# Patient Record
Sex: Male | Born: 1945 | Race: Black or African American | Hispanic: No | Marital: Single | Smoking: Former smoker
Health system: Southern US, Community
[De-identification: ages and names within clinical notes are randomized; demographics above are authoritative.]

## PROBLEM LIST (undated history)

## (undated) DIAGNOSIS — C61 Malignant neoplasm of prostate: Secondary | ICD-10-CM

## (undated) DIAGNOSIS — I10 Essential (primary) hypertension: Secondary | ICD-10-CM

## (undated) DIAGNOSIS — M069 Rheumatoid arthritis, unspecified: Secondary | ICD-10-CM

## (undated) DIAGNOSIS — K219 Gastro-esophageal reflux disease without esophagitis: Secondary | ICD-10-CM

## (undated) DIAGNOSIS — E7800 Pure hypercholesterolemia, unspecified: Secondary | ICD-10-CM

## (undated) HISTORY — PX: PROSTATE BIOPSY: SHX241

---

## 1999-09-05 ENCOUNTER — Encounter: Admission: RE | Admit: 1999-09-05 | Discharge: 1999-09-05 | Payer: Self-pay

## 2001-01-21 ENCOUNTER — Emergency Department (HOSPITAL_COMMUNITY): Admission: EM | Admit: 2001-01-21 | Discharge: 2001-01-21 | Payer: Self-pay | Admitting: Emergency Medicine

## 2001-01-21 ENCOUNTER — Encounter: Payer: Self-pay | Admitting: Emergency Medicine

## 2001-04-18 ENCOUNTER — Emergency Department (HOSPITAL_COMMUNITY): Admission: EM | Admit: 2001-04-18 | Discharge: 2001-04-18 | Payer: Self-pay | Admitting: Emergency Medicine

## 2001-04-18 ENCOUNTER — Encounter: Payer: Self-pay | Admitting: Emergency Medicine

## 2004-09-18 ENCOUNTER — Encounter (INDEPENDENT_AMBULATORY_CARE_PROVIDER_SITE_OTHER): Payer: Self-pay | Admitting: *Deleted

## 2004-09-18 ENCOUNTER — Ambulatory Visit (HOSPITAL_COMMUNITY): Admission: RE | Admit: 2004-09-18 | Discharge: 2004-09-18 | Payer: Self-pay | Admitting: Gastroenterology

## 2005-12-09 ENCOUNTER — Emergency Department (HOSPITAL_COMMUNITY): Admission: EM | Admit: 2005-12-09 | Discharge: 2005-12-09 | Payer: Self-pay | Admitting: Emergency Medicine

## 2006-08-07 ENCOUNTER — Encounter: Admission: RE | Admit: 2006-08-07 | Discharge: 2006-08-07 | Payer: Self-pay | Admitting: Family Medicine

## 2006-08-16 ENCOUNTER — Emergency Department (HOSPITAL_COMMUNITY): Admission: EM | Admit: 2006-08-16 | Discharge: 2006-08-16 | Payer: Self-pay | Admitting: Emergency Medicine

## 2006-09-08 HISTORY — PX: OTHER SURGICAL HISTORY: SHX169

## 2007-09-09 HISTORY — PX: OTHER SURGICAL HISTORY: SHX169

## 2008-03-01 ENCOUNTER — Emergency Department (HOSPITAL_COMMUNITY): Admission: EM | Admit: 2008-03-01 | Discharge: 2008-03-01 | Payer: Self-pay | Admitting: Emergency Medicine

## 2008-03-01 ENCOUNTER — Ambulatory Visit (HOSPITAL_COMMUNITY): Admission: AD | Admit: 2008-03-01 | Discharge: 2008-03-01 | Payer: Self-pay | Admitting: Urology

## 2009-11-27 ENCOUNTER — Encounter: Admission: RE | Admit: 2009-11-27 | Discharge: 2009-11-27 | Payer: Self-pay | Admitting: Otolaryngology

## 2009-12-20 ENCOUNTER — Encounter: Admission: RE | Admit: 2009-12-20 | Discharge: 2009-12-20 | Payer: Self-pay | Admitting: Otolaryngology

## 2009-12-20 ENCOUNTER — Other Ambulatory Visit: Admission: RE | Admit: 2009-12-20 | Discharge: 2009-12-20 | Payer: Self-pay | Admitting: Interventional Radiology

## 2010-04-02 ENCOUNTER — Encounter: Admission: RE | Admit: 2010-04-02 | Discharge: 2010-04-02 | Payer: Self-pay | Admitting: Otolaryngology

## 2011-01-21 NOTE — Op Note (Signed)
NAME:  Charles Frost, Charles Frost NO.:  [id number]   MEDICAL RECORD NO.:  [id number]          PATIENT TYPE:  AMB   LOCATION:  DAY                          FACILITY:  [facility]   PHYSICIAN:  Sigmund I. Patsi Sears, M.D.DATE OF BIRTH:  28-Nov-1945   DATE OF PROCEDURE:  03/01/2008  DATE OF DISCHARGE:                               OPERATIVE REPORT   PREOPERATIVE DIAGNOSES:  Distal right ureteral calculus with  hydronephrosis, and ureteral colic.   POSTOPERATIVE DIAGNOSES:  Distal right ureteral calculus with  hydronephrosis, and ureteral colic.   OPERATION:  Cystourethroscopy, right retrograde pyelogram with  interpretation, right ureteroscopy, basket extraction of right distal  ureteral calculus, right double-J catheter (6 x 24 cm).   SURGEON:  Dr. Patsi Sears.   ANESTHESIA:  General LMA.   PREPARATION:  After appropriate preanesthesia, the patient was brought  to the operating room and placed on the operating room in the dorsal  supine position where general LMA anesthesia was introduced.  He was  then replaced in the dorsal lithotomy position.  The pubis was prepped  with Betadine solution and draped in the usual fashion.   BRIEF HISTORY:  This 65 year old male seen in [facility] emergency  room, with 8.5 mm right distal ureteral calculus, with right sided  hydronephrosis, and renal colic.  He is now for basket extraction.   DESCRIPTION OF PROCEDURE:  Cystourethroscopy revealed a normal-appearing  bladder.  There was no evidence of bladder stone, tumor or diverticular  formation.  The prostate was subtotal occluded.   Right retrograde pyelogram was performed, which showed a distal right  ureteral calculus as noted on CT scan, which was evaluated in the  operating room.  A guidewire was passed into the right renal pelvis, and  the ureteroscope was passed into the ureter.  The right ureteral  calculus identified, and was oblong in shape, but rather smooth.  The  nitinol  basket was passed beyond the stone, dragged distalward, and the  stone was captured.  It was manipulated through the intramural ureter,  and the ureteral orifice, with some blood clot behind it.  This was  irrigated free from the bladder.  It was elected to leave a double-J  catheter, and therefore, a 6 x 24 double-J catheter was passed into the  renal pelvis, and coiled in the bladder.  The patient tolerated the  procedure well.  He was given a B&O suppository, and IV Toradol,  awakened and taken to the recovery room in good condition.      Sigmund I. Patsi Sears, M.D.  Electronically Signed     SIT/MEDQ  D:  03/01/2008  T:  03/01/2008  Job:  [id number]

## 2011-01-24 NOTE — Op Note (Signed)
NAME:  Charles Frost, Charles Frost NO.:  [id number]   MEDICAL RECORD NO.:  [id number]          PATIENT TYPE:  AMB   LOCATION:  ENDO                         FACILITY:  [facility]   PHYSICIAN:  Graylin Shiver, M.D.   DATE OF BIRTH:  11-10-1945   DATE OF PROCEDURE:  09/18/2004  DATE OF DISCHARGE:                                 OPERATIVE REPORT   INDICATIONS:  Screening.   Informed consent was obtained after explanation of the risks of bleeding,  infection and perforation.   PREMEDICATION:  Fentanyl 50 mcg IV, Versed 7 milligrams IV.   PROCEDURE:  With the patient in the left lateral decubitus position, a  rectal exam was performed. No masses were felt. The Olympus colonoscope was  inserted into the rectum and advanced around an extremely tortuous colon to  the cecum. Cecal landmarks were identified. The cecum and ascending colon  were normal. The transverse colon was normal. In the distal descending colon  there was a small 2 mm polyp biopsied off with cold forceps. In the sigmoid  there was a 5 mm sessile polyp snared with a mini snare and removed by snare  cautery technique. The polyp was retrieved. The cautery site looked good.  The rectum looked normal. He tolerated the procedure well without  complications.   IMPRESSION:  Colon polyps.   PLAN:  Pathology will be checked.       SFG/MEDQ  D:  09/18/2004  T:  09/18/2004  Job:  [id number]   cc:   Madison Hickman, M.D.

## 2011-06-05 LAB — URINALYSIS, ROUTINE W REFLEX MICROSCOPIC
Bilirubin Urine: NEGATIVE
Glucose, UA: NEGATIVE
Hgb urine dipstick: NEGATIVE
Nitrite: NEGATIVE
Specific Gravity, Urine: 1.02

## 2011-06-05 LAB — URINE CULTURE: Culture: NO GROWTH

## 2011-06-05 LAB — CBC
HCT: 40.6
MCHC: 32.4
MCV: 84.1
RDW: 14.2

## 2011-06-05 LAB — DIFFERENTIAL
Basophils Absolute: 0
Eosinophils Absolute: 0
Eosinophils Relative: 0
Lymphocytes Relative: 8 — ABNORMAL LOW
Lymphs Abs: 0.7
Monocytes Relative: 5
Neutrophils Relative %: 86 — ABNORMAL HIGH

## 2011-06-05 LAB — POCT I-STAT, CHEM 8
Calcium, Ion: 1.19
Glucose, Bld: 140 — ABNORMAL HIGH
HCT: 44

## 2013-01-21 ENCOUNTER — Emergency Department (HOSPITAL_COMMUNITY)
Admission: EM | Admit: 2013-01-21 | Discharge: 2013-01-22 | Disposition: A | Discharge status: Home / Self Care | Payer: 59 | Attending: Emergency Medicine | Admitting: Emergency Medicine

## 2013-01-21 DIAGNOSIS — N200 Calculus of kidney: Secondary | ICD-10-CM | POA: Insufficient documentation

## 2013-01-21 DIAGNOSIS — R300 Dysuria: Secondary | ICD-10-CM | POA: Insufficient documentation

## 2013-01-21 DIAGNOSIS — L0291 Cutaneous abscess, unspecified: Secondary | ICD-10-CM | POA: Insufficient documentation

## 2013-01-21 LAB — URINALYSIS, ROUTINE W REFLEX MICROSCOPIC
Bilirubin Urine: NEGATIVE
Glucose, UA: NEGATIVE mg/dL
Ketones, ur: NEGATIVE mg/dL
Leukocytes, UA: NEGATIVE
Nitrite: NEGATIVE
Protein, ur: NEGATIVE mg/dL

## 2013-01-21 LAB — COMPREHENSIVE METABOLIC PANEL
AST: 21 U/L (ref 0–37)
Albumin: 4 g/dL (ref 3.5–5.2)
Alkaline Phosphatase: 72 U/L (ref 39–117)
CO2: 26 mEq/L (ref 19–32)
Chloride: 104 mEq/L (ref 96–112)
Creatinine, Ser: 1.09 mg/dL (ref 0.50–1.35)
Total Protein: 7.8 g/dL (ref 6.0–8.3)

## 2013-01-21 LAB — URINE MICROSCOPIC-ADD ON

## 2013-01-21 LAB — CBC WITH DIFFERENTIAL/PLATELET
Eosinophils Absolute: 0.4 10*3/uL (ref 0.0–0.7)
HCT: 39.2 % (ref 39.0–52.0)
Hemoglobin: 12.9 g/dL — ABNORMAL LOW (ref 13.0–17.0)
Lymphocytes Relative: 29 % (ref 12–46)
MCH: 27.3 pg (ref 26.0–34.0)
MCHC: 32.9 g/dL (ref 30.0–36.0)
MCV: 83.1 fL (ref 78.0–100.0)
Neutro Abs: 3.8 10*3/uL (ref 1.7–7.7)
Platelets: 293 10*3/uL (ref 150–400)
RBC: 4.72 MIL/uL (ref 4.22–5.81)
WBC: 6.6 10*3/uL (ref 4.0–10.5)

## 2013-01-21 MED ORDER — ONDANSETRON 4 MG PO TBDP
4.0000 mg | ORAL_TABLET | Freq: Once | ORAL | Status: AC
  Administered 2013-01-21: 4 mg via ORAL
  Filled 2013-01-21: qty 1

## 2013-01-21 MED ORDER — OXYCODONE-ACETAMINOPHEN 5-325 MG PO TABS
1.0000 | ORAL_TABLET | Freq: Once | ORAL | Status: AC
  Administered 2013-01-21: 1 via ORAL
  Filled 2013-01-21: qty 1

## 2013-01-21 NOTE — ED Notes (Signed)
Pt states left flank pain for 3-4 days. Pt states hx kidney stones. Pt also having burning sensation with urination. Pt states no blood in his urine.

## 2013-01-22 ENCOUNTER — Emergency Department (HOSPITAL_COMMUNITY): Payer: 59

## 2013-01-22 MED ORDER — HYDROCODONE-ACETAMINOPHEN 5-325 MG PO TABS: 1.0000 | ORAL_TABLET | Freq: Four times a day (QID) | ORAL | Status: DC | PRN

## 2013-01-22 MED ORDER — IBUPROFEN 400 MG PO TABS
400.0000 mg | ORAL_TABLET | Freq: Once | ORAL | Status: AC
  Administered 2013-01-22: 400 mg via ORAL
  Filled 2013-01-22: qty 1

## 2013-01-22 MED ORDER — OXYCODONE-ACETAMINOPHEN 5-325 MG PO TABS
1.0000 | ORAL_TABLET | Freq: Once | ORAL | Status: AC
  Administered 2013-01-22: 1 via ORAL
  Filled 2013-01-22: qty 1

## 2013-01-22 MED ORDER — NAPROXEN 375 MG PO TABS: 375.0000 mg | ORAL_TABLET | Freq: Two times a day (BID) | ORAL | Status: DC

## 2013-01-22 NOTE — ED Provider Notes (Signed)
History     CSN: [id number]  Arrival date & time 01/21/13  2140   First MD Initiated Contact with Patient 01/21/13 2345      Chief Complaint  Patient presents with  . Flank Pain    left     (Consider location/radiation/quality/duration/timing/severity/associated sxs/prior treatment) HPI Comments: Charles Frost is a 67 y.o. male patient with a history of kidney stones presents to the emergency department complaining of flank pain radiating to the groin started approximately 3-4 days ago. Patient states that the pain is intermittent and sometimes the spikes depending severity. Patient is currently on Bactrim for chronic abscesses. Patient denies any hematuria or urinary frequency, but does report mild dysuria. No penile dc. No other at this time.  Patient is a 67 y.o. male presenting with flank pain. The history is provided by the patient.  Flank Pain    No past medical history on file.  No past surgical history on file.  No family history on file.  History  Substance Use Topics  . Smoking status: Not on file  . Smokeless tobacco: Not on file  . Alcohol Use: Not on file      Review of Systems  Genitourinary: Positive for flank pain.  All other systems reviewed and are negative.    Allergies  Review of patient's allergies indicates no known allergies.  Home Medications   Current Outpatient Rx  Name  Route  Sig  Dispense  Refill  . naproxen (NAPROSYN) 500 MG tablet   Oral   Take 500 mg by mouth 2 (two) times daily as needed (arthritis pain).         Bertram Gala Glycol-Propyl Glycol (SYSTANE OP)   Both Eyes   Place 1-2 drops into both eyes daily as needed (eye irritation).         Marland Kitchen sulfamethoxazole-trimethoprim (BACTRIM DS) 800-160 MG per tablet   Oral   Take 1 tablet by mouth 2 (two) times daily. 14 day treatment for absess in mouth           BP 178/99  Pulse 83  Temp(Src) 98 F (36.7 C) (Oral)  Resp 16  SpO2 97%  Physical Exam  Nursing  note and vitals reviewed. Constitutional: He is oriented to person, place, and time. He appears well-developed and well-nourished. No distress.  HENT:  Head: Normocephalic and atraumatic.  Eyes: Conjunctivae and EOM are normal.  Neck: Normal range of motion.  Pulmonary/Chest: Effort normal.  Abdominal:  Soft non tender abdomen   Genitourinary:  Very mild right CVA tenderness. No penile dc or other abnormality seen. Exam chaperoned.   Musculoskeletal: Normal range of motion.  Neurological: He is alert and oriented to person, place, and time.  Skin: Skin is warm and dry. No rash noted. He is not diaphoretic.  Psychiatric: He has a normal mood and affect. His behavior is normal.    ED Course  Procedures (including critical care time)  Labs Reviewed  CBC WITH DIFFERENTIAL - Abnormal; Notable for the following:    Hemoglobin 12.9 (*)    Eosinophils Relative 7 (*)    All other components within normal limits  COMPREHENSIVE METABOLIC PANEL - Abnormal; Notable for the following:    Glucose, Bld 143 (*)    GFR calc non Af Amer 70 (*)    GFR calc Af Amer 81 (*)    All other components within normal limits  URINALYSIS, ROUTINE W REFLEX MICROSCOPIC - Abnormal; Notable for the following:    Hgb  urine dipstick LARGE (*)    All other components within normal limits  URINE MICROSCOPIC-ADD ON   Ct Abdomen Pelvis Wo Contrast  01/22/2013   *RADIOLOGY REPORT*  Clinical Data: Flank pain  CT ABDOMEN AND PELVIS WITHOUT CONTRAST  Technique:  Multidetector CT imaging of the abdomen and pelvis was performed following the standard protocol without intravenous contrast.  Comparison: 03/01/2008  Findings: The lung bases both appear clear.  There is no pericardial or pleural effusion noted.  Small focus of low attenuation in the inferior right hepatic lobe is too small to characterize measuring 6 mm.  Gallbladder is normal.  No biliary dilatation.  Calcifications within the pancreatic parenchyma suggest  chronic pancreatitis.  The spleen is normal.  The adrenal glands are both unremarkable.  Multiple right renal calculi are identified.  The largest is in the inferior pole measuring 7 mm.  Multiple left renal calculi are also noted.  The largest is in the inferior pole measuring 6 mm.  There is no evidence for hydronephrosis.  No significant hydroureter identified.  Within the urinary bladder near the left UVJ there is a 5 mm stone, image 75/series 2.  The prostate gland and seminal vesicles are unremarkable.  There is mild calcified atherosclerotic disease affecting the abdominal aorta.  No aneurysm is identified.  No upper abdominal adenopathy noted.  There is no pelvic or inguinal adenopathy.  No free fluid or abnormal fluid collection is identified within the abdomen or the pelvis.  Review of the visualized osseous structures shows mild degenerative disc disease at the L3-4 level.  IMPRESSION:  1.  Bladder calculus near the left UVJ measures 5 mm.  No significant obstructive uropathy identified. 2.  Bilateral renal calculi. 3.  Calcifications of the pancreatic parenchyma.  Correlate for any clinical signs or symptoms of chronic pancreatitis.   Original Report Authenticated By: Signa Kell, M.D.     No diagnosis found.    MDM  Pt has been diagnosed with a recently passed Kidney Stone via CT. There is no evidence of significant hydronephrosis, serum creatine WNL, vitals sign stable and the pt does not have irratractable vomiting. Pt will be dc home with pain medications & has been advised to follow up with PCP.          Jaci Carrel, [place] 01/22/13 0126

## 2013-01-22 NOTE — ED Provider Notes (Signed)
Medical screening examination/treatment/procedure(s) were performed by non-physician practitioner and as supervising physician I was immediately available for consultation/collaboration.  Dametri Ozburn M Shalena Ezzell, MD 01/22/13 0626 

## 2014-06-14 ENCOUNTER — Ambulatory Visit
Admission: RE | Admit: 2014-06-14 | Discharge: 2014-06-14 | Disposition: A | Discharge status: Home / Self Care | Payer: Commercial Managed Care - HMO | Source: Ambulatory Visit | Attending: Physician Assistant | Admitting: Physician Assistant

## 2014-06-14 ENCOUNTER — Other Ambulatory Visit: Payer: Self-pay | Admitting: Physician Assistant

## 2014-06-14 DIAGNOSIS — M79641 Pain in right hand: Secondary | ICD-10-CM

## 2014-06-14 DIAGNOSIS — M79642 Pain in left hand: Principal | ICD-10-CM

## 2014-09-08 DIAGNOSIS — M069 Rheumatoid arthritis, unspecified: Secondary | ICD-10-CM | POA: Insufficient documentation

## 2014-10-02 DIAGNOSIS — E790 Hyperuricemia without signs of inflammatory arthritis and tophaceous disease: Secondary | ICD-10-CM | POA: Diagnosis not present

## 2014-10-02 DIAGNOSIS — M25442 Effusion, left hand: Secondary | ICD-10-CM | POA: Diagnosis not present

## 2014-10-02 DIAGNOSIS — M0589 Other rheumatoid arthritis with rheumatoid factor of multiple sites: Secondary | ICD-10-CM | POA: Diagnosis not present

## 2014-10-02 DIAGNOSIS — Z840 Family history of diseases of the skin and subcutaneous tissue: Secondary | ICD-10-CM | POA: Diagnosis not present

## 2014-11-30 DIAGNOSIS — Z79899 Other long term (current) drug therapy: Secondary | ICD-10-CM | POA: Diagnosis not present

## 2014-11-30 DIAGNOSIS — M2550 Pain in unspecified joint: Secondary | ICD-10-CM | POA: Diagnosis not present

## 2014-11-30 DIAGNOSIS — M0589 Other rheumatoid arthritis with rheumatoid factor of multiple sites: Secondary | ICD-10-CM | POA: Diagnosis not present

## 2014-12-28 DIAGNOSIS — M0589 Other rheumatoid arthritis with rheumatoid factor of multiple sites: Secondary | ICD-10-CM | POA: Diagnosis not present

## 2014-12-28 DIAGNOSIS — Z9229 Personal history of other drug therapy: Secondary | ICD-10-CM | POA: Diagnosis not present

## 2014-12-28 DIAGNOSIS — M0559 Rheumatoid polyneuropathy with rheumatoid arthritis of multiple sites: Secondary | ICD-10-CM | POA: Diagnosis not present

## 2015-02-01 DIAGNOSIS — M0589 Other rheumatoid arthritis with rheumatoid factor of multiple sites: Secondary | ICD-10-CM | POA: Diagnosis not present

## 2015-02-01 DIAGNOSIS — M2550 Pain in unspecified joint: Secondary | ICD-10-CM | POA: Diagnosis not present

## 2015-02-01 DIAGNOSIS — E790 Hyperuricemia without signs of inflammatory arthritis and tophaceous disease: Secondary | ICD-10-CM | POA: Diagnosis not present

## 2015-02-01 DIAGNOSIS — Z79899 Other long term (current) drug therapy: Secondary | ICD-10-CM | POA: Diagnosis not present

## 2015-02-15 DIAGNOSIS — M0589 Other rheumatoid arthritis with rheumatoid factor of multiple sites: Secondary | ICD-10-CM | POA: Diagnosis not present

## 2015-03-07 DIAGNOSIS — J019 Acute sinusitis, unspecified: Secondary | ICD-10-CM | POA: Diagnosis not present

## 2015-03-07 DIAGNOSIS — Z1389 Encounter for screening for other disorder: Secondary | ICD-10-CM | POA: Diagnosis not present

## 2015-03-07 DIAGNOSIS — Z9181 History of falling: Secondary | ICD-10-CM | POA: Diagnosis not present

## 2015-03-07 DIAGNOSIS — Z6829 Body mass index (BMI) 29.0-29.9, adult: Secondary | ICD-10-CM | POA: Diagnosis not present

## 2015-03-07 DIAGNOSIS — Z139 Encounter for screening, unspecified: Secondary | ICD-10-CM | POA: Diagnosis not present

## 2015-07-04 DIAGNOSIS — Z79899 Other long term (current) drug therapy: Secondary | ICD-10-CM | POA: Diagnosis not present

## 2015-07-04 DIAGNOSIS — M0589 Other rheumatoid arthritis with rheumatoid factor of multiple sites: Secondary | ICD-10-CM | POA: Diagnosis not present

## 2015-07-26 DIAGNOSIS — M2550 Pain in unspecified joint: Secondary | ICD-10-CM | POA: Diagnosis not present

## 2015-07-26 DIAGNOSIS — Z79899 Other long term (current) drug therapy: Secondary | ICD-10-CM | POA: Diagnosis not present

## 2015-07-26 DIAGNOSIS — E790 Hyperuricemia without signs of inflammatory arthritis and tophaceous disease: Secondary | ICD-10-CM | POA: Diagnosis not present

## 2015-07-26 DIAGNOSIS — M0589 Other rheumatoid arthritis with rheumatoid factor of multiple sites: Secondary | ICD-10-CM | POA: Diagnosis not present

## 2015-09-25 DIAGNOSIS — J019 Acute sinusitis, unspecified: Secondary | ICD-10-CM | POA: Diagnosis not present

## 2015-09-25 DIAGNOSIS — Z681 Body mass index (BMI) 19 or less, adult: Secondary | ICD-10-CM | POA: Diagnosis not present

## 2015-10-10 DIAGNOSIS — E790 Hyperuricemia without signs of inflammatory arthritis and tophaceous disease: Secondary | ICD-10-CM | POA: Diagnosis not present

## 2015-10-10 DIAGNOSIS — M2550 Pain in unspecified joint: Secondary | ICD-10-CM | POA: Diagnosis not present

## 2015-10-10 DIAGNOSIS — M0589 Other rheumatoid arthritis with rheumatoid factor of multiple sites: Secondary | ICD-10-CM | POA: Diagnosis not present

## 2015-10-10 DIAGNOSIS — Z79899 Other long term (current) drug therapy: Secondary | ICD-10-CM | POA: Diagnosis not present

## 2015-11-09 DIAGNOSIS — Z0000 Encounter for general adult medical examination without abnormal findings: Secondary | ICD-10-CM | POA: Diagnosis not present

## 2015-11-09 DIAGNOSIS — I10 Essential (primary) hypertension: Secondary | ICD-10-CM | POA: Diagnosis not present

## 2015-11-09 DIAGNOSIS — Z136 Encounter for screening for cardiovascular disorders: Secondary | ICD-10-CM | POA: Diagnosis not present

## 2015-11-09 DIAGNOSIS — Z125 Encounter for screening for malignant neoplasm of prostate: Secondary | ICD-10-CM | POA: Diagnosis not present

## 2015-11-09 DIAGNOSIS — Z6829 Body mass index (BMI) 29.0-29.9, adult: Secondary | ICD-10-CM | POA: Diagnosis not present

## 2015-11-09 DIAGNOSIS — Z7289 Other problems related to lifestyle: Secondary | ICD-10-CM | POA: Diagnosis not present

## 2015-11-09 DIAGNOSIS — Z131 Encounter for screening for diabetes mellitus: Secondary | ICD-10-CM | POA: Diagnosis not present

## 2016-01-02 DIAGNOSIS — M0589 Other rheumatoid arthritis with rheumatoid factor of multiple sites: Secondary | ICD-10-CM | POA: Diagnosis not present

## 2016-01-02 DIAGNOSIS — Z79899 Other long term (current) drug therapy: Secondary | ICD-10-CM | POA: Diagnosis not present

## 2016-01-02 DIAGNOSIS — M2550 Pain in unspecified joint: Secondary | ICD-10-CM | POA: Diagnosis not present

## 2016-01-02 DIAGNOSIS — E790 Hyperuricemia without signs of inflammatory arthritis and tophaceous disease: Secondary | ICD-10-CM | POA: Diagnosis not present

## 2016-02-12 DIAGNOSIS — R972 Elevated prostate specific antigen [PSA]: Secondary | ICD-10-CM | POA: Diagnosis not present

## 2016-02-28 DIAGNOSIS — K5730 Diverticulosis of large intestine without perforation or abscess without bleeding: Secondary | ICD-10-CM | POA: Diagnosis not present

## 2016-02-28 DIAGNOSIS — Z86010 Personal history of colonic polyps: Secondary | ICD-10-CM | POA: Diagnosis not present

## 2016-03-05 DIAGNOSIS — M2550 Pain in unspecified joint: Secondary | ICD-10-CM | POA: Diagnosis not present

## 2016-03-05 DIAGNOSIS — M0589 Other rheumatoid arthritis with rheumatoid factor of multiple sites: Secondary | ICD-10-CM | POA: Diagnosis not present

## 2016-03-05 DIAGNOSIS — E790 Hyperuricemia without signs of inflammatory arthritis and tophaceous disease: Secondary | ICD-10-CM | POA: Diagnosis not present

## 2016-03-05 DIAGNOSIS — Z79899 Other long term (current) drug therapy: Secondary | ICD-10-CM | POA: Diagnosis not present

## 2016-03-05 DIAGNOSIS — R5383 Other fatigue: Secondary | ICD-10-CM | POA: Diagnosis not present

## 2016-04-29 DIAGNOSIS — Z79899 Other long term (current) drug therapy: Secondary | ICD-10-CM | POA: Diagnosis not present

## 2016-04-29 DIAGNOSIS — M2550 Pain in unspecified joint: Secondary | ICD-10-CM | POA: Diagnosis not present

## 2016-04-29 DIAGNOSIS — E790 Hyperuricemia without signs of inflammatory arthritis and tophaceous disease: Secondary | ICD-10-CM | POA: Diagnosis not present

## 2016-04-29 DIAGNOSIS — Z23 Encounter for immunization: Secondary | ICD-10-CM | POA: Diagnosis not present

## 2016-04-29 DIAGNOSIS — M0589 Other rheumatoid arthritis with rheumatoid factor of multiple sites: Secondary | ICD-10-CM | POA: Diagnosis not present

## 2016-05-13 DIAGNOSIS — Z23 Encounter for immunization: Secondary | ICD-10-CM | POA: Diagnosis not present

## 2016-05-23 DIAGNOSIS — R351 Nocturia: Secondary | ICD-10-CM | POA: Diagnosis not present

## 2016-05-23 DIAGNOSIS — M069 Rheumatoid arthritis, unspecified: Secondary | ICD-10-CM | POA: Diagnosis not present

## 2016-05-23 DIAGNOSIS — R972 Elevated prostate specific antigen [PSA]: Secondary | ICD-10-CM | POA: Diagnosis not present

## 2016-05-23 DIAGNOSIS — N401 Enlarged prostate with lower urinary tract symptoms: Secondary | ICD-10-CM | POA: Diagnosis not present

## 2016-05-27 DIAGNOSIS — M0589 Other rheumatoid arthritis with rheumatoid factor of multiple sites: Secondary | ICD-10-CM | POA: Diagnosis not present

## 2016-05-27 DIAGNOSIS — Z79899 Other long term (current) drug therapy: Secondary | ICD-10-CM | POA: Diagnosis not present

## 2016-05-27 DIAGNOSIS — M2550 Pain in unspecified joint: Secondary | ICD-10-CM | POA: Diagnosis not present

## 2016-05-27 DIAGNOSIS — E790 Hyperuricemia without signs of inflammatory arthritis and tophaceous disease: Secondary | ICD-10-CM | POA: Diagnosis not present

## 2016-06-20 DIAGNOSIS — J019 Acute sinusitis, unspecified: Secondary | ICD-10-CM | POA: Diagnosis not present

## 2016-06-20 DIAGNOSIS — Z6828 Body mass index (BMI) 28.0-28.9, adult: Secondary | ICD-10-CM | POA: Diagnosis not present

## 2016-07-18 DIAGNOSIS — R972 Elevated prostate specific antigen [PSA]: Secondary | ICD-10-CM | POA: Diagnosis not present

## 2016-07-28 DIAGNOSIS — E790 Hyperuricemia without signs of inflammatory arthritis and tophaceous disease: Secondary | ICD-10-CM | POA: Diagnosis not present

## 2016-07-28 DIAGNOSIS — Z79899 Other long term (current) drug therapy: Secondary | ICD-10-CM | POA: Diagnosis not present

## 2016-07-28 DIAGNOSIS — M0589 Other rheumatoid arthritis with rheumatoid factor of multiple sites: Secondary | ICD-10-CM | POA: Diagnosis not present

## 2016-07-28 DIAGNOSIS — M2550 Pain in unspecified joint: Secondary | ICD-10-CM | POA: Diagnosis not present

## 2016-08-08 DIAGNOSIS — C61 Malignant neoplasm of prostate: Secondary | ICD-10-CM | POA: Diagnosis not present

## 2016-08-19 DIAGNOSIS — C61 Malignant neoplasm of prostate: Secondary | ICD-10-CM | POA: Diagnosis not present

## 2016-08-20 ENCOUNTER — Encounter: Payer: Self-pay | Admitting: Radiation Oncology

## 2016-08-28 ENCOUNTER — Encounter: Payer: Self-pay | Admitting: Radiation Oncology

## 2016-08-28 ENCOUNTER — Ambulatory Visit
Admission: RE | Admit: 2016-08-28 | Discharge: 2016-08-28 | Disposition: A | Discharge status: Home / Self Care | Payer: Medicare HMO | Source: Ambulatory Visit | Attending: Radiation Oncology | Admitting: Radiation Oncology

## 2016-08-28 ENCOUNTER — Encounter: Payer: Self-pay | Admitting: Medical Oncology

## 2016-08-28 VITALS — BP 135/84 | HR 82 | Resp 16 | Ht 67.0 in | Wt 178.8 lb

## 2016-08-28 DIAGNOSIS — Z87891 Personal history of nicotine dependence: Secondary | ICD-10-CM | POA: Insufficient documentation

## 2016-08-28 DIAGNOSIS — C61 Malignant neoplasm of prostate: Secondary | ICD-10-CM | POA: Diagnosis not present

## 2016-08-28 DIAGNOSIS — M069 Rheumatoid arthritis, unspecified: Secondary | ICD-10-CM | POA: Insufficient documentation

## 2016-08-28 DIAGNOSIS — I10 Essential (primary) hypertension: Secondary | ICD-10-CM | POA: Insufficient documentation

## 2016-08-28 DIAGNOSIS — Z510 Encounter for antineoplastic radiation therapy: Secondary | ICD-10-CM | POA: Insufficient documentation

## 2016-08-28 DIAGNOSIS — K219 Gastro-esophageal reflux disease without esophagitis: Secondary | ICD-10-CM | POA: Insufficient documentation

## 2016-08-28 DIAGNOSIS — R972 Elevated prostate specific antigen [PSA]: Secondary | ICD-10-CM | POA: Diagnosis not present

## 2016-08-28 DIAGNOSIS — E7800 Pure hypercholesterolemia, unspecified: Secondary | ICD-10-CM | POA: Insufficient documentation

## 2016-08-28 HISTORY — DX: Gastro-esophageal reflux disease without esophagitis: K21.9

## 2016-08-28 HISTORY — DX: Essential (primary) hypertension: I10

## 2016-08-28 HISTORY — DX: Pure hypercholesterolemia, unspecified: E78.00

## 2016-08-28 HISTORY — DX: Rheumatoid arthritis, unspecified: M06.9

## 2016-08-28 HISTORY — DX: Malignant neoplasm of prostate: C61

## 2016-08-28 NOTE — Progress Notes (Signed)
GU Location of Tumor / Histology: Prostate  If Prostate Cancer, Gleason Score is (4 +4=8) and PSA is 7.6 (02/12/2016), 5.2 (11/09/2015), 3.0 (10/18/2012)  Charles Frost presented: has had progression of his voiding symptoms over the last 6 months including frequency and urgency.  He does not have a history of prostatitis.   Biopsies of prostate (if applicable) revealed:     Past/Anticipated interventions by urology, if any: Dr. Gaynelle Arabian- TRUSP: 07/18/2016  Past/Anticipated interventions by medical oncology, if any: n/a  Weight changes, if any: reports prior to diagnosis he weighed 190 lb. Present weight 178 lb. Relates weight loss to depression.  Bowel/Bladder complaints, if any: IPSS 24 with incomplete emptying, frequency, intermittency, urgency, weak stream, straining and nocturia x 4. Reports mild - moderate ED. Reports occasional dysuria. Reports scant hematuria since biopsy. Denies leakage or incontinence but, has come close due to urgency.   Nausea/Vomiting, if any: no  Pain issues, if any:  yes, related to effects of RA  SAFETY ISSUES:  Prior radiation? no  Pacemaker/ICD? no  Possible current pregnancy? N/A  Is the patient on methotrexate? YES  Current Complaints / other details:  70 year old male. Married.

## 2016-08-28 NOTE — Progress Notes (Signed)
Radiation Oncology         [phone] ________________________________  Initial outpatient Consultation  Name: Charles Frost MRN: [id number]  Date: 08/28/2016  DOB: 22-Dec-1945  CC:No PCP Per Patient  Carolan Clines, MD   REFERRING PHYSICIAN: Carolan Clines, MD  DIAGNOSIS: 70 y.o. gentleman with stage T1c adenocarcinoma of the prostate with a Gleason's score of 4+4 and a PSA of 7.6    ICD-9-CM ICD-10-CM   1. Malignant neoplasm of prostate ([place]) [place] Ambulatory referral to [place] is a 70 y.o. gentleman with a new diagnosis of prostate cancer.  He was noted to have an elevated PSA of 7.6 in April of 2017 by his primary care physician, Dr. Lisbeth Ply.  Accordingly, he was referred for evaluation in urology by Dr. Gaynelle Arabian, and on 08/19/16,  digital rectal examination was performed at that time revealing no nodularity.  The patient proceeded to transrectal ultrasound with 12 biopsies of the prostate on 08/19/16. The prostate volume measured 36.14 cc.  Out of 12 core biopsies, 5 were positive.  The maximum Gleason score was 4+4=8, and this was seen in right mid lateral portion of the gland. The patient reviewed the biopsy results with his urologist and he has kindly been referred today for discussion of potential radiation treatment options.      On review of systems, the patient notes a 12 lb weight loss since diagnosis; he states this is related to depression about his illness. He desc incomplete emptying, frequency, intermittency, urgency, weak stream ,straining, occasional dysuria, and nocturia x 4. He reports mild-moderate ED. He reports scant hematuria since biopsy.  He denies nausea/vomiting, leakage or incontinence. He notes pain due to effects of Ra. Of note, patient is on methotrexate.  PREVIOUS RADIATION THERAPY: No  PAST MEDICAL HISTORY:  Past Medical History:  Diagnosis Date  . GERD (gastroesophageal reflux  disease)   . Hypercholesterolemia   . Hypertension   . Prostate cancer ([place])   . Rheumatoid arthritis ([place])    RA      PAST SURGICAL HISTORY: Past Surgical History:  Procedure Laterality Date  . cysto uretero remove stone  2009  . cystoscopy insert stent  2009  . PROSTATE BIOPSY    . ulnar nerve release Right 2008    FAMILY HISTORY:  Family History  Problem Relation Age of Onset  . Cancer Maternal Aunt   . Cancer Paternal Grandfather     SOCIAL HISTORY:  reports that he quit smoking about 16 years ago. His smoking use included Cigarettes. He has a 7.50 pack-year smoking history. He has never used smokeless tobacco. He reports that he does not drink alcohol or use drugs. The patient is in a relationship and resides in [place], [place].  ALLERGIES: Patient has no known allergies.  MEDICATIONS:  Current Outpatient Prescriptions  Medication Sig Dispense Refill  . amoxicillin (AMOXIL) 875 MG tablet Take 875 mg by mouth 2 (two) times daily.    . folic acid (FOLVITE) 1 MG tablet Take 1 mg by mouth daily.    . lansoprazole (PREVACID) 30 MG capsule Take 30 mg by mouth daily at 12 noon.    . methotrexate (RHEUMATREX) 5 MG tablet Take 5 mg by mouth once a week. Caution: Chemotherapy. Protect from light.    Marland Kitchen HYDROcodone-acetaminophen (NORCO/VICODIN) 5-325 MG per tablet Take 1 tablet by mouth every 6 (six) hours as needed for pain. (Patient not taking: Reported on 08/28/2016) 15 tablet 0  .  naproxen (NAPROSYN) 500 MG tablet Take 500 mg by mouth 2 (two) times daily as needed (arthritis pain).    Vladimir Faster Glycol-Propyl Glycol (SYSTANE OP) Place 1-2 drops into both eyes daily as needed (eye irritation).    . predniSONE (DELTASONE) 5 MG tablet Take 5 mg by mouth daily with breakfast. PRN     No current facility-administered medications for this encounter.     REVIEW OF SYSTEMS:  On review of systems, the patient reports that he is doing well overall. He denies any chest pain, shortness of  breath, cough, fevers, chills, night sweats, unintended weight changes. He denies any bowel disturbances, and denies abdominal pain, nausea or vomiting. He denies any new musculoskeletal or joint aches or pains.  His IPSS score was 24 indicating severe urinary outflow obstructive symptoms.  He indicated that his erectile function is 14 to complete sexual activity about half of the time. A complete review of systems is obtained and is otherwise negative.    PHYSICAL EXAM:    height is 5\' 7"  (1.702 m) and weight is 178 lb 12.8 oz (81.1 kg). His blood pressure is 135/84 and his pulse is 82. His respiration is 16 and oxygen saturation is 100%.   In general this is a well appearing african [place] man in no acute distress. He's alert and oriented x4 and appropriate throughout the examination. Cardiopulmonary assessment is negative for acute distress and he exhibits normal effort.   KPS = 80  100 - Normal; no complaints; no evidence of disease. 90   - Able to carry on normal activity; minor signs or symptoms of disease. 80   - Normal activity with effort; some signs or symptoms of disease. 34   - Cares for self; unable to carry on normal activity or to do active work. 60   - Requires occasional assistance, but is able to care for most of his personal needs. 50   - Requires considerable assistance and frequent medical care. 75   - Disabled; requires special care and assistance. 37   - Severely disabled; hospital admission is indicated although death not imminent. 4   - Very sick; hospital admission necessary; active supportive treatment necessary. 10   - Moribund; fatal processes progressing rapidly. 0     - Dead  Karnofsky DA, [place], Craver LS and [facility] [phone]) The use of the nitrogen mustards in the palliative treatment of carcinoma: with particular reference to bronchogenic carcinoma Cancer 1 [phone]   LABORATORY DATA:  Lab Results  Component Value Date   WBC 6.6 01/21/2013   HGB  12.9 (L) 01/21/2013   HCT 39.2 01/21/2013   MCV 83.1 01/21/2013   PLT 293 01/21/2013   Lab Results  Component Value Date   NA 138 01/21/2013   K 4.5 01/21/2013   CL 104 01/21/2013   CO2 26 01/21/2013   Lab Results  Component Value Date   ALT 19 01/21/2013   AST 21 01/21/2013   ALKPHOS 72 01/21/2013   BILITOT 0.3 01/21/2013     RADIOGRAPHY: No results found.    IMPRESSION/PLAN:  58. 70 yo gentleman with Stage T1c adenocarcinoma of the prostate with a Gleason's score of 4+4 and a PSA of 7.6. Dr. Tammi Klippel reviews his pathology and work up thus far. He discussed the His T-Stage, Gleason's Score, and PSA put him into the high risk group.  natural history of prostate cancer.  We reviewed the the implications of T-stage, Gleason's Score, and PSA on decision-making  and outcomes in prostate cancer.  We discussed radiation treatment in the management of prostate cancer with regard to the logistics and delivery of external beam radiation treatment. Accordingly he is eligible for external beam radiation with androgen deprivation therapy for 2 years, with or without radioactive seeds implant as a boost if his symptoms of BPH improved. The patient is thinking about prostate IMRT over 8 weeks. He will meet with Dr. Alinda Money to help determine his best course of treatment. If he decides to proceed with IMRT, we will share this with Dr. Gaynelle Arabian and move forward with scheduling placement of three gold fiducial markers into the prostate to proceed with IMRT in the near future. We enjoyed meeting with him today, and will look forward to participating in the care of this very nice gentleman.  The above documentation reflects my direct findings during this shared patient visit. Please see the separate note by Dr. Tammi Klippel on this date for the remainder of the patient's plan of care.  In a visit lasting 60 minutes, greater than 50% of the time was spent face to face discussing his options for treatment detailing  each side effect profile as well as risks and benefits of each, and coordinating the patient's care.     Carola Rhine, PAC    This document serves as a record of services personally performed by Shona Simpson, PAC and [name], MD. It was created on their behalf by Bethann Humble, a trained medical scribe. The creation of this record is based on the scribe's personal observations and the provider's statements to them. This document has been checked and approved by the attending provider.

## 2016-08-28 NOTE — Progress Notes (Signed)
See progress note under physician encounter. 

## 2016-09-04 ENCOUNTER — Encounter: Payer: Self-pay | Admitting: *Deleted

## 2016-09-04 NOTE — Progress Notes (Signed)
[place] Psychosocial Distress Screening Clinical Social Work  Clinical Social Work was referred by distress screening protocol.  The patient scored a 7 on the Psychosocial Distress Thermometer which indicates moderate distress. Clinical Social Worker contacted patient by phone to assess for distress and other psychosocial needs. Mr. Tokarz shared he is "adjusting"- explained he is still adjusting to diagnosis and plans for treatment but feel that his emotions are managed.  CSW encouraged patient to contact CSW if he continues to have difficulty adjusting or if he would like additional support.  Patient reported no other concerns at this time.  ONCBCN DISTRESS SCREENING 08/28/2016  Screening Type Initial Screening  Distress experienced in past week (1-10) 7  Emotional problem type Depression;Nervousness/Anxiety;Adjusting to illness  Spiritual/Religous concerns type Loss of sense of purpose  Information Concerns Type Lack of info about maintaining fitness  Physical Problem type Loss of appetitie;Constipation/diarrhea;Changes in urination;Sexual problems  Physician notified of physical symptoms Yes  Referral to clinical psychology No  Referral to clinical social work Yes  Referral to dietition No  Referral to financial advocate No  Referral to support programs No  Referral to palliative care No    Polo Riley, MSW, LCSW, OSW-C Clinical Social Worker [place] [phone]

## 2016-09-23 DIAGNOSIS — C61 Malignant neoplasm of prostate: Secondary | ICD-10-CM | POA: Diagnosis not present

## 2016-10-03 ENCOUNTER — Other Ambulatory Visit (HOSPITAL_COMMUNITY): Payer: Self-pay | Admitting: Urology

## 2016-10-03 DIAGNOSIS — C61 Malignant neoplasm of prostate: Secondary | ICD-10-CM

## 2016-10-07 DIAGNOSIS — Z5111 Encounter for antineoplastic chemotherapy: Secondary | ICD-10-CM | POA: Diagnosis not present

## 2016-10-07 DIAGNOSIS — C61 Malignant neoplasm of prostate: Secondary | ICD-10-CM | POA: Diagnosis not present

## 2016-10-10 ENCOUNTER — Encounter (HOSPITAL_COMMUNITY)
Admission: RE | Admit: 2016-10-10 | Discharge: 2016-10-10 | Disposition: A | Discharge status: Home / Self Care | Payer: Medicare HMO | Source: Ambulatory Visit | Attending: Urology | Admitting: Urology

## 2016-10-10 DIAGNOSIS — C61 Malignant neoplasm of prostate: Secondary | ICD-10-CM | POA: Insufficient documentation

## 2016-10-10 MED ORDER — TECHNETIUM TC 99M MEDRONATE IV KIT
22.0000 | PACK | Freq: Once | INTRAVENOUS | Status: AC | PRN
  Administered 2016-10-10: 22 via INTRAVENOUS

## 2016-10-17 DIAGNOSIS — C61 Malignant neoplasm of prostate: Secondary | ICD-10-CM | POA: Diagnosis not present

## 2016-10-20 DIAGNOSIS — H25813 Combined forms of age-related cataract, bilateral: Secondary | ICD-10-CM | POA: Diagnosis not present

## 2016-10-23 ENCOUNTER — Ambulatory Visit
Admission: RE | Admit: 2016-10-23 | Discharge: 2016-10-23 | Disposition: A | Discharge status: Home / Self Care | Payer: Medicare HMO | Source: Ambulatory Visit | Attending: Radiation Oncology | Admitting: Radiation Oncology

## 2016-10-23 DIAGNOSIS — I10 Essential (primary) hypertension: Secondary | ICD-10-CM | POA: Diagnosis not present

## 2016-10-23 DIAGNOSIS — C61 Malignant neoplasm of prostate: Secondary | ICD-10-CM | POA: Diagnosis not present

## 2016-10-23 DIAGNOSIS — E7800 Pure hypercholesterolemia, unspecified: Secondary | ICD-10-CM | POA: Diagnosis not present

## 2016-10-23 DIAGNOSIS — K219 Gastro-esophageal reflux disease without esophagitis: Secondary | ICD-10-CM | POA: Diagnosis not present

## 2016-10-23 DIAGNOSIS — Z510 Encounter for antineoplastic radiation therapy: Secondary | ICD-10-CM | POA: Diagnosis not present

## 2016-10-23 DIAGNOSIS — Z87891 Personal history of nicotine dependence: Secondary | ICD-10-CM | POA: Diagnosis not present

## 2016-10-23 DIAGNOSIS — M069 Rheumatoid arthritis, unspecified: Secondary | ICD-10-CM | POA: Diagnosis not present

## 2016-10-23 NOTE — Progress Notes (Signed)
  Radiation Oncology         [phone]) [phone] ________________________________  Name: Charles Frost MRN: [id number]  Date: 10/23/2016  DOB: October 18, 1945  SIMULATION AND TREATMENT PLANNING NOTE    ICD-9-CM ICD-10-CM   1. Malignant neoplasm of prostate ([place]) 185 C61     DIAGNOSIS:  71 y.o. gentleman with stage T1c adenocarcinoma of the prostate with a Gleason's score of 4+4 and a PSA of 7.6  NARRATIVE:  The patient was brought to the [facility].  Identity was confirmed.  All relevant records and images related to the planned course of therapy were reviewed.  The patient freely provided informed written consent to proceed with treatment after reviewing the details related to the planned course of therapy. The consent form was witnessed and verified by the simulation staff.  Then, the patient was set-up in a stable reproducible supine position for radiation therapy.  A vacuum lock pillow device was custom fabricated to position his legs in a reproducible immobilized position.  Then, I performed a urethrogram under sterile conditions to identify the prostatic apex.  CT images were obtained.  Surface markings were placed.  The CT images were loaded into the planning software.  Then the prostate target and avoidance structures including the rectum, bladder, bowel and hips were contoured.  Treatment planning then occurred.  The radiation prescription was entered and confirmed.  A total of 1 complex treatment devices were fabricated. I have requested : Intensity Modulated Radiotherapy (IMRT) is medically necessary for this case for the following reason:  Rectal sparing.Marland Kitchen  PLAN:  The patient will receive 75 Gy in 40 fractions with 45 Gy to pelvic nodes then boost  ________________________________  Sheral Apley. Tammi Klippel, M.D.

## 2016-10-28 DIAGNOSIS — E790 Hyperuricemia without signs of inflammatory arthritis and tophaceous disease: Secondary | ICD-10-CM | POA: Diagnosis not present

## 2016-10-28 DIAGNOSIS — M2550 Pain in unspecified joint: Secondary | ICD-10-CM | POA: Diagnosis not present

## 2016-10-28 DIAGNOSIS — M0589 Other rheumatoid arthritis with rheumatoid factor of multiple sites: Secondary | ICD-10-CM | POA: Diagnosis not present

## 2016-10-28 DIAGNOSIS — Z79899 Other long term (current) drug therapy: Secondary | ICD-10-CM | POA: Diagnosis not present

## 2016-10-31 DIAGNOSIS — M069 Rheumatoid arthritis, unspecified: Secondary | ICD-10-CM | POA: Diagnosis not present

## 2016-10-31 DIAGNOSIS — E7800 Pure hypercholesterolemia, unspecified: Secondary | ICD-10-CM | POA: Diagnosis not present

## 2016-10-31 DIAGNOSIS — C61 Malignant neoplasm of prostate: Secondary | ICD-10-CM | POA: Diagnosis not present

## 2016-10-31 DIAGNOSIS — Z510 Encounter for antineoplastic radiation therapy: Secondary | ICD-10-CM | POA: Diagnosis not present

## 2016-10-31 DIAGNOSIS — Z87891 Personal history of nicotine dependence: Secondary | ICD-10-CM | POA: Diagnosis not present

## 2016-10-31 DIAGNOSIS — K219 Gastro-esophageal reflux disease without esophagitis: Secondary | ICD-10-CM | POA: Diagnosis not present

## 2016-10-31 DIAGNOSIS — I10 Essential (primary) hypertension: Secondary | ICD-10-CM | POA: Diagnosis not present

## 2016-11-03 ENCOUNTER — Ambulatory Visit
Admission: RE | Admit: 2016-11-03 | Discharge: 2016-11-03 | Disposition: A | Discharge status: Home / Self Care | Payer: Medicare HMO | Source: Ambulatory Visit | Attending: Radiation Oncology | Admitting: Radiation Oncology

## 2016-11-03 DIAGNOSIS — K219 Gastro-esophageal reflux disease without esophagitis: Secondary | ICD-10-CM | POA: Diagnosis not present

## 2016-11-03 DIAGNOSIS — Z510 Encounter for antineoplastic radiation therapy: Secondary | ICD-10-CM | POA: Diagnosis not present

## 2016-11-03 DIAGNOSIS — C61 Malignant neoplasm of prostate: Secondary | ICD-10-CM | POA: Diagnosis not present

## 2016-11-03 DIAGNOSIS — M069 Rheumatoid arthritis, unspecified: Secondary | ICD-10-CM | POA: Diagnosis not present

## 2016-11-03 DIAGNOSIS — E7800 Pure hypercholesterolemia, unspecified: Secondary | ICD-10-CM | POA: Diagnosis not present

## 2016-11-03 DIAGNOSIS — Z87891 Personal history of nicotine dependence: Secondary | ICD-10-CM | POA: Diagnosis not present

## 2016-11-03 DIAGNOSIS — I10 Essential (primary) hypertension: Secondary | ICD-10-CM | POA: Diagnosis not present

## 2016-11-04 ENCOUNTER — Ambulatory Visit
Admission: RE | Admit: 2016-11-04 | Discharge: 2016-11-04 | Disposition: A | Discharge status: Home / Self Care | Payer: Medicare HMO | Source: Ambulatory Visit | Attending: Radiation Oncology | Admitting: Radiation Oncology

## 2016-11-04 DIAGNOSIS — C61 Malignant neoplasm of prostate: Secondary | ICD-10-CM | POA: Diagnosis not present

## 2016-11-04 DIAGNOSIS — I10 Essential (primary) hypertension: Secondary | ICD-10-CM | POA: Diagnosis not present

## 2016-11-04 DIAGNOSIS — Z87891 Personal history of nicotine dependence: Secondary | ICD-10-CM | POA: Diagnosis not present

## 2016-11-04 DIAGNOSIS — E7800 Pure hypercholesterolemia, unspecified: Secondary | ICD-10-CM | POA: Diagnosis not present

## 2016-11-04 DIAGNOSIS — M069 Rheumatoid arthritis, unspecified: Secondary | ICD-10-CM | POA: Diagnosis not present

## 2016-11-04 DIAGNOSIS — K219 Gastro-esophageal reflux disease without esophagitis: Secondary | ICD-10-CM | POA: Diagnosis not present

## 2016-11-04 DIAGNOSIS — Z510 Encounter for antineoplastic radiation therapy: Secondary | ICD-10-CM | POA: Diagnosis not present

## 2016-11-05 ENCOUNTER — Ambulatory Visit
Admission: RE | Admit: 2016-11-05 | Discharge: 2016-11-05 | Disposition: A | Discharge status: Home / Self Care | Payer: Medicare HMO | Source: Ambulatory Visit | Attending: Radiation Oncology | Admitting: Radiation Oncology

## 2016-11-05 DIAGNOSIS — Z510 Encounter for antineoplastic radiation therapy: Secondary | ICD-10-CM | POA: Diagnosis not present

## 2016-11-05 DIAGNOSIS — Z87891 Personal history of nicotine dependence: Secondary | ICD-10-CM | POA: Diagnosis not present

## 2016-11-05 DIAGNOSIS — M069 Rheumatoid arthritis, unspecified: Secondary | ICD-10-CM | POA: Diagnosis not present

## 2016-11-05 DIAGNOSIS — E7800 Pure hypercholesterolemia, unspecified: Secondary | ICD-10-CM | POA: Diagnosis not present

## 2016-11-05 DIAGNOSIS — C61 Malignant neoplasm of prostate: Secondary | ICD-10-CM | POA: Diagnosis not present

## 2016-11-05 DIAGNOSIS — I10 Essential (primary) hypertension: Secondary | ICD-10-CM | POA: Diagnosis not present

## 2016-11-05 DIAGNOSIS — K219 Gastro-esophageal reflux disease without esophagitis: Secondary | ICD-10-CM | POA: Diagnosis not present

## 2016-11-06 ENCOUNTER — Ambulatory Visit
Admission: RE | Admit: 2016-11-06 | Discharge: 2016-11-06 | Disposition: A | Discharge status: Home / Self Care | Payer: Medicare HMO | Source: Ambulatory Visit | Attending: Radiation Oncology | Admitting: Radiation Oncology

## 2016-11-06 DIAGNOSIS — C61 Malignant neoplasm of prostate: Secondary | ICD-10-CM | POA: Diagnosis not present

## 2016-11-06 DIAGNOSIS — Z510 Encounter for antineoplastic radiation therapy: Secondary | ICD-10-CM | POA: Diagnosis not present

## 2016-11-06 DIAGNOSIS — E7800 Pure hypercholesterolemia, unspecified: Secondary | ICD-10-CM | POA: Diagnosis not present

## 2016-11-06 DIAGNOSIS — I10 Essential (primary) hypertension: Secondary | ICD-10-CM | POA: Diagnosis not present

## 2016-11-06 DIAGNOSIS — Z87891 Personal history of nicotine dependence: Secondary | ICD-10-CM | POA: Diagnosis not present

## 2016-11-06 DIAGNOSIS — M069 Rheumatoid arthritis, unspecified: Secondary | ICD-10-CM | POA: Diagnosis not present

## 2016-11-06 DIAGNOSIS — K219 Gastro-esophageal reflux disease without esophagitis: Secondary | ICD-10-CM | POA: Diagnosis not present

## 2016-11-07 ENCOUNTER — Ambulatory Visit
Admission: RE | Admit: 2016-11-07 | Discharge: 2016-11-07 | Disposition: A | Discharge status: Home / Self Care | Payer: Medicare HMO | Source: Ambulatory Visit | Attending: Radiation Oncology | Admitting: Radiation Oncology

## 2016-11-07 DIAGNOSIS — K219 Gastro-esophageal reflux disease without esophagitis: Secondary | ICD-10-CM | POA: Diagnosis not present

## 2016-11-07 DIAGNOSIS — E7800 Pure hypercholesterolemia, unspecified: Secondary | ICD-10-CM | POA: Diagnosis not present

## 2016-11-07 DIAGNOSIS — Z87891 Personal history of nicotine dependence: Secondary | ICD-10-CM | POA: Diagnosis not present

## 2016-11-07 DIAGNOSIS — C61 Malignant neoplasm of prostate: Secondary | ICD-10-CM | POA: Diagnosis not present

## 2016-11-07 DIAGNOSIS — M069 Rheumatoid arthritis, unspecified: Secondary | ICD-10-CM | POA: Diagnosis not present

## 2016-11-07 DIAGNOSIS — Z510 Encounter for antineoplastic radiation therapy: Secondary | ICD-10-CM | POA: Diagnosis not present

## 2016-11-07 DIAGNOSIS — I10 Essential (primary) hypertension: Secondary | ICD-10-CM | POA: Diagnosis not present

## 2016-11-10 ENCOUNTER — Ambulatory Visit
Admission: RE | Admit: 2016-11-10 | Discharge: 2016-11-10 | Disposition: A | Discharge status: Home / Self Care | Payer: Medicare HMO | Source: Ambulatory Visit | Attending: Radiation Oncology | Admitting: Radiation Oncology

## 2016-11-10 DIAGNOSIS — M069 Rheumatoid arthritis, unspecified: Secondary | ICD-10-CM | POA: Diagnosis not present

## 2016-11-10 DIAGNOSIS — Z87891 Personal history of nicotine dependence: Secondary | ICD-10-CM | POA: Diagnosis not present

## 2016-11-10 DIAGNOSIS — K219 Gastro-esophageal reflux disease without esophagitis: Secondary | ICD-10-CM | POA: Diagnosis not present

## 2016-11-10 DIAGNOSIS — E7800 Pure hypercholesterolemia, unspecified: Secondary | ICD-10-CM | POA: Diagnosis not present

## 2016-11-10 DIAGNOSIS — C61 Malignant neoplasm of prostate: Secondary | ICD-10-CM | POA: Diagnosis not present

## 2016-11-10 DIAGNOSIS — I10 Essential (primary) hypertension: Secondary | ICD-10-CM | POA: Diagnosis not present

## 2016-11-10 DIAGNOSIS — Z510 Encounter for antineoplastic radiation therapy: Secondary | ICD-10-CM | POA: Diagnosis not present

## 2016-11-10 NOTE — Progress Notes (Signed)
  Radiation Oncology         [phone]   Name: Charles Frost MRN: [id number]   Date: 11/07/2016  DOB: [birth year]   Weekly Radiation Therapy Management    ICD-9-CM ICD-10-CM   1. Malignant neoplasm of prostate ([place]) 185 C61     Current Dose: 9 Gy  Planned Dose:  75 Gy   Narrative The patient presents for routine under treatment assessment. The patient is without complaint. Set-up films were reviewed. The chart was checked.  Physical Findings Weight essentially stable.  No significant changes.  Impression The patient is tolerating radiation.  Plan Continue treatment as planned.         Sheral Apley Tammi Klippel, M.D.

## 2016-11-11 ENCOUNTER — Ambulatory Visit
Admission: RE | Admit: 2016-11-11 | Discharge: 2016-11-11 | Disposition: A | Discharge status: Home / Self Care | Payer: Medicare HMO | Source: Ambulatory Visit | Attending: Radiation Oncology | Admitting: Radiation Oncology

## 2016-11-11 DIAGNOSIS — K219 Gastro-esophageal reflux disease without esophagitis: Secondary | ICD-10-CM | POA: Diagnosis not present

## 2016-11-11 DIAGNOSIS — I10 Essential (primary) hypertension: Secondary | ICD-10-CM | POA: Diagnosis not present

## 2016-11-11 DIAGNOSIS — Z510 Encounter for antineoplastic radiation therapy: Secondary | ICD-10-CM | POA: Diagnosis not present

## 2016-11-11 DIAGNOSIS — E7800 Pure hypercholesterolemia, unspecified: Secondary | ICD-10-CM | POA: Diagnosis not present

## 2016-11-11 DIAGNOSIS — Z87891 Personal history of nicotine dependence: Secondary | ICD-10-CM | POA: Diagnosis not present

## 2016-11-11 DIAGNOSIS — C61 Malignant neoplasm of prostate: Secondary | ICD-10-CM | POA: Diagnosis not present

## 2016-11-11 DIAGNOSIS — M069 Rheumatoid arthritis, unspecified: Secondary | ICD-10-CM | POA: Diagnosis not present

## 2016-11-12 ENCOUNTER — Ambulatory Visit
Admission: RE | Admit: 2016-11-12 | Discharge: 2016-11-12 | Disposition: A | Discharge status: Home / Self Care | Payer: Medicare HMO | Source: Ambulatory Visit | Attending: Radiation Oncology | Admitting: Radiation Oncology

## 2016-11-12 DIAGNOSIS — Z510 Encounter for antineoplastic radiation therapy: Secondary | ICD-10-CM | POA: Diagnosis not present

## 2016-11-12 DIAGNOSIS — Z87891 Personal history of nicotine dependence: Secondary | ICD-10-CM | POA: Diagnosis not present

## 2016-11-12 DIAGNOSIS — C61 Malignant neoplasm of prostate: Secondary | ICD-10-CM | POA: Diagnosis not present

## 2016-11-12 DIAGNOSIS — I10 Essential (primary) hypertension: Secondary | ICD-10-CM | POA: Diagnosis not present

## 2016-11-12 DIAGNOSIS — K219 Gastro-esophageal reflux disease without esophagitis: Secondary | ICD-10-CM | POA: Diagnosis not present

## 2016-11-12 DIAGNOSIS — M069 Rheumatoid arthritis, unspecified: Secondary | ICD-10-CM | POA: Diagnosis not present

## 2016-11-12 DIAGNOSIS — E7800 Pure hypercholesterolemia, unspecified: Secondary | ICD-10-CM | POA: Diagnosis not present

## 2016-11-13 ENCOUNTER — Ambulatory Visit
Admission: RE | Admit: 2016-11-13 | Discharge: 2016-11-13 | Disposition: A | Discharge status: Home / Self Care | Payer: Medicare HMO | Source: Ambulatory Visit | Attending: Radiation Oncology | Admitting: Radiation Oncology

## 2016-11-13 DIAGNOSIS — Z87891 Personal history of nicotine dependence: Secondary | ICD-10-CM | POA: Diagnosis not present

## 2016-11-13 DIAGNOSIS — M069 Rheumatoid arthritis, unspecified: Secondary | ICD-10-CM | POA: Diagnosis not present

## 2016-11-13 DIAGNOSIS — C61 Malignant neoplasm of prostate: Secondary | ICD-10-CM | POA: Diagnosis not present

## 2016-11-13 DIAGNOSIS — I10 Essential (primary) hypertension: Secondary | ICD-10-CM | POA: Diagnosis not present

## 2016-11-13 DIAGNOSIS — Z510 Encounter for antineoplastic radiation therapy: Secondary | ICD-10-CM | POA: Diagnosis not present

## 2016-11-13 DIAGNOSIS — K219 Gastro-esophageal reflux disease without esophagitis: Secondary | ICD-10-CM | POA: Diagnosis not present

## 2016-11-13 DIAGNOSIS — E7800 Pure hypercholesterolemia, unspecified: Secondary | ICD-10-CM | POA: Diagnosis not present

## 2016-11-14 ENCOUNTER — Ambulatory Visit
Admission: RE | Admit: 2016-11-14 | Discharge: 2016-11-14 | Disposition: A | Discharge status: Home / Self Care | Payer: Medicare HMO | Source: Ambulatory Visit | Attending: Radiation Oncology | Admitting: Radiation Oncology

## 2016-11-14 DIAGNOSIS — Z510 Encounter for antineoplastic radiation therapy: Secondary | ICD-10-CM | POA: Diagnosis not present

## 2016-11-14 DIAGNOSIS — Z87891 Personal history of nicotine dependence: Secondary | ICD-10-CM | POA: Diagnosis not present

## 2016-11-14 DIAGNOSIS — E7800 Pure hypercholesterolemia, unspecified: Secondary | ICD-10-CM | POA: Diagnosis not present

## 2016-11-14 DIAGNOSIS — M069 Rheumatoid arthritis, unspecified: Secondary | ICD-10-CM | POA: Diagnosis not present

## 2016-11-14 DIAGNOSIS — K219 Gastro-esophageal reflux disease without esophagitis: Secondary | ICD-10-CM | POA: Diagnosis not present

## 2016-11-14 DIAGNOSIS — I10 Essential (primary) hypertension: Secondary | ICD-10-CM | POA: Diagnosis not present

## 2016-11-14 DIAGNOSIS — C61 Malignant neoplasm of prostate: Secondary | ICD-10-CM | POA: Diagnosis not present

## 2016-11-17 ENCOUNTER — Ambulatory Visit
Admission: RE | Admit: 2016-11-17 | Discharge: 2016-11-17 | Disposition: A | Discharge status: Home / Self Care | Payer: Medicare HMO | Source: Ambulatory Visit | Attending: Radiation Oncology | Admitting: Radiation Oncology

## 2016-11-17 DIAGNOSIS — Z87891 Personal history of nicotine dependence: Secondary | ICD-10-CM | POA: Diagnosis not present

## 2016-11-17 DIAGNOSIS — C61 Malignant neoplasm of prostate: Secondary | ICD-10-CM | POA: Diagnosis not present

## 2016-11-17 DIAGNOSIS — Z510 Encounter for antineoplastic radiation therapy: Secondary | ICD-10-CM | POA: Diagnosis not present

## 2016-11-17 DIAGNOSIS — I10 Essential (primary) hypertension: Secondary | ICD-10-CM | POA: Diagnosis not present

## 2016-11-17 DIAGNOSIS — K219 Gastro-esophageal reflux disease without esophagitis: Secondary | ICD-10-CM | POA: Diagnosis not present

## 2016-11-17 DIAGNOSIS — E7800 Pure hypercholesterolemia, unspecified: Secondary | ICD-10-CM | POA: Diagnosis not present

## 2016-11-17 DIAGNOSIS — M069 Rheumatoid arthritis, unspecified: Secondary | ICD-10-CM | POA: Diagnosis not present

## 2016-11-18 ENCOUNTER — Ambulatory Visit
Admission: RE | Admit: 2016-11-18 | Discharge: 2016-11-18 | Disposition: A | Discharge status: Home / Self Care | Payer: Medicare HMO | Source: Ambulatory Visit | Attending: Radiation Oncology | Admitting: Radiation Oncology

## 2016-11-18 DIAGNOSIS — Z510 Encounter for antineoplastic radiation therapy: Secondary | ICD-10-CM | POA: Diagnosis not present

## 2016-11-18 DIAGNOSIS — I10 Essential (primary) hypertension: Secondary | ICD-10-CM | POA: Diagnosis not present

## 2016-11-18 DIAGNOSIS — M069 Rheumatoid arthritis, unspecified: Secondary | ICD-10-CM | POA: Diagnosis not present

## 2016-11-18 DIAGNOSIS — Z87891 Personal history of nicotine dependence: Secondary | ICD-10-CM | POA: Diagnosis not present

## 2016-11-18 DIAGNOSIS — C61 Malignant neoplasm of prostate: Secondary | ICD-10-CM | POA: Diagnosis not present

## 2016-11-18 DIAGNOSIS — E7800 Pure hypercholesterolemia, unspecified: Secondary | ICD-10-CM | POA: Diagnosis not present

## 2016-11-18 DIAGNOSIS — K219 Gastro-esophageal reflux disease without esophagitis: Secondary | ICD-10-CM | POA: Diagnosis not present

## 2016-11-19 ENCOUNTER — Ambulatory Visit
Admission: RE | Admit: 2016-11-19 | Discharge: 2016-11-19 | Disposition: A | Discharge status: Home / Self Care | Payer: Medicare HMO | Source: Ambulatory Visit | Attending: Radiation Oncology | Admitting: Radiation Oncology

## 2016-11-19 DIAGNOSIS — I10 Essential (primary) hypertension: Secondary | ICD-10-CM | POA: Diagnosis not present

## 2016-11-19 DIAGNOSIS — M069 Rheumatoid arthritis, unspecified: Secondary | ICD-10-CM | POA: Diagnosis not present

## 2016-11-19 DIAGNOSIS — Z87891 Personal history of nicotine dependence: Secondary | ICD-10-CM | POA: Diagnosis not present

## 2016-11-19 DIAGNOSIS — Z510 Encounter for antineoplastic radiation therapy: Secondary | ICD-10-CM | POA: Diagnosis not present

## 2016-11-19 DIAGNOSIS — E7800 Pure hypercholesterolemia, unspecified: Secondary | ICD-10-CM | POA: Diagnosis not present

## 2016-11-19 DIAGNOSIS — K219 Gastro-esophageal reflux disease without esophagitis: Secondary | ICD-10-CM | POA: Diagnosis not present

## 2016-11-19 DIAGNOSIS — C61 Malignant neoplasm of prostate: Secondary | ICD-10-CM | POA: Diagnosis not present

## 2016-11-20 ENCOUNTER — Ambulatory Visit
Admission: RE | Admit: 2016-11-20 | Discharge: 2016-11-20 | Disposition: A | Discharge status: Home / Self Care | Payer: Medicare HMO | Source: Ambulatory Visit | Attending: Radiation Oncology | Admitting: Radiation Oncology

## 2016-11-20 DIAGNOSIS — Z87891 Personal history of nicotine dependence: Secondary | ICD-10-CM | POA: Diagnosis not present

## 2016-11-20 DIAGNOSIS — Z510 Encounter for antineoplastic radiation therapy: Secondary | ICD-10-CM | POA: Diagnosis not present

## 2016-11-20 DIAGNOSIS — K219 Gastro-esophageal reflux disease without esophagitis: Secondary | ICD-10-CM | POA: Diagnosis not present

## 2016-11-20 DIAGNOSIS — M069 Rheumatoid arthritis, unspecified: Secondary | ICD-10-CM | POA: Diagnosis not present

## 2016-11-20 DIAGNOSIS — E7800 Pure hypercholesterolemia, unspecified: Secondary | ICD-10-CM | POA: Diagnosis not present

## 2016-11-20 DIAGNOSIS — I10 Essential (primary) hypertension: Secondary | ICD-10-CM | POA: Diagnosis not present

## 2016-11-20 DIAGNOSIS — C61 Malignant neoplasm of prostate: Secondary | ICD-10-CM | POA: Diagnosis not present

## 2016-11-21 ENCOUNTER — Ambulatory Visit
Admission: RE | Admit: 2016-11-21 | Discharge: 2016-11-21 | Disposition: A | Discharge status: Home / Self Care | Payer: Medicare HMO | Source: Ambulatory Visit | Attending: Radiation Oncology | Admitting: Radiation Oncology

## 2016-11-21 DIAGNOSIS — Z87891 Personal history of nicotine dependence: Secondary | ICD-10-CM | POA: Diagnosis not present

## 2016-11-21 DIAGNOSIS — E7800 Pure hypercholesterolemia, unspecified: Secondary | ICD-10-CM | POA: Diagnosis not present

## 2016-11-21 DIAGNOSIS — K219 Gastro-esophageal reflux disease without esophagitis: Secondary | ICD-10-CM | POA: Diagnosis not present

## 2016-11-21 DIAGNOSIS — C61 Malignant neoplasm of prostate: Secondary | ICD-10-CM | POA: Diagnosis not present

## 2016-11-21 DIAGNOSIS — Z510 Encounter for antineoplastic radiation therapy: Secondary | ICD-10-CM | POA: Diagnosis not present

## 2016-11-21 DIAGNOSIS — I10 Essential (primary) hypertension: Secondary | ICD-10-CM | POA: Diagnosis not present

## 2016-11-21 DIAGNOSIS — M069 Rheumatoid arthritis, unspecified: Secondary | ICD-10-CM | POA: Diagnosis not present

## 2016-11-24 ENCOUNTER — Ambulatory Visit
Admission: RE | Admit: 2016-11-24 | Discharge: 2016-11-24 | Disposition: A | Discharge status: Home / Self Care | Payer: Medicare HMO | Source: Ambulatory Visit | Attending: Radiation Oncology | Admitting: Radiation Oncology

## 2016-11-24 DIAGNOSIS — C61 Malignant neoplasm of prostate: Secondary | ICD-10-CM | POA: Diagnosis not present

## 2016-11-24 DIAGNOSIS — E7800 Pure hypercholesterolemia, unspecified: Secondary | ICD-10-CM | POA: Diagnosis not present

## 2016-11-24 DIAGNOSIS — Z510 Encounter for antineoplastic radiation therapy: Secondary | ICD-10-CM | POA: Diagnosis not present

## 2016-11-24 DIAGNOSIS — M069 Rheumatoid arthritis, unspecified: Secondary | ICD-10-CM | POA: Diagnosis not present

## 2016-11-24 DIAGNOSIS — Z87891 Personal history of nicotine dependence: Secondary | ICD-10-CM | POA: Diagnosis not present

## 2016-11-24 DIAGNOSIS — K219 Gastro-esophageal reflux disease without esophagitis: Secondary | ICD-10-CM | POA: Diagnosis not present

## 2016-11-24 DIAGNOSIS — I10 Essential (primary) hypertension: Secondary | ICD-10-CM | POA: Diagnosis not present

## 2016-11-25 ENCOUNTER — Ambulatory Visit
Admission: RE | Admit: 2016-11-25 | Discharge: 2016-11-25 | Disposition: A | Discharge status: Home / Self Care | Payer: Medicare HMO | Source: Ambulatory Visit | Attending: Radiation Oncology | Admitting: Radiation Oncology

## 2016-11-25 DIAGNOSIS — Z87891 Personal history of nicotine dependence: Secondary | ICD-10-CM | POA: Diagnosis not present

## 2016-11-25 DIAGNOSIS — M069 Rheumatoid arthritis, unspecified: Secondary | ICD-10-CM | POA: Diagnosis not present

## 2016-11-25 DIAGNOSIS — E7800 Pure hypercholesterolemia, unspecified: Secondary | ICD-10-CM | POA: Diagnosis not present

## 2016-11-25 DIAGNOSIS — I10 Essential (primary) hypertension: Secondary | ICD-10-CM | POA: Diagnosis not present

## 2016-11-25 DIAGNOSIS — K219 Gastro-esophageal reflux disease without esophagitis: Secondary | ICD-10-CM | POA: Diagnosis not present

## 2016-11-25 DIAGNOSIS — Z510 Encounter for antineoplastic radiation therapy: Secondary | ICD-10-CM | POA: Diagnosis not present

## 2016-11-25 DIAGNOSIS — C61 Malignant neoplasm of prostate: Secondary | ICD-10-CM | POA: Diagnosis not present

## 2016-11-26 ENCOUNTER — Ambulatory Visit
Admission: RE | Admit: 2016-11-26 | Discharge: 2016-11-26 | Disposition: A | Discharge status: Home / Self Care | Payer: Medicare HMO | Source: Ambulatory Visit | Attending: Radiation Oncology | Admitting: Radiation Oncology

## 2016-11-26 DIAGNOSIS — E7800 Pure hypercholesterolemia, unspecified: Secondary | ICD-10-CM | POA: Insufficient documentation

## 2016-11-26 DIAGNOSIS — C61 Malignant neoplasm of prostate: Secondary | ICD-10-CM | POA: Diagnosis not present

## 2016-11-26 DIAGNOSIS — Z87891 Personal history of nicotine dependence: Secondary | ICD-10-CM | POA: Insufficient documentation

## 2016-11-26 DIAGNOSIS — Z87442 Personal history of urinary calculi: Secondary | ICD-10-CM | POA: Insufficient documentation

## 2016-11-26 DIAGNOSIS — I10 Essential (primary) hypertension: Secondary | ICD-10-CM | POA: Diagnosis not present

## 2016-11-26 DIAGNOSIS — N390 Urinary tract infection, site not specified: Secondary | ICD-10-CM | POA: Diagnosis not present

## 2016-11-26 DIAGNOSIS — R300 Dysuria: Secondary | ICD-10-CM | POA: Insufficient documentation

## 2016-11-26 DIAGNOSIS — Z9889 Other specified postprocedural states: Secondary | ICD-10-CM | POA: Diagnosis not present

## 2016-11-26 DIAGNOSIS — Z510 Encounter for antineoplastic radiation therapy: Secondary | ICD-10-CM | POA: Diagnosis not present

## 2016-11-26 DIAGNOSIS — Z809 Family history of malignant neoplasm, unspecified: Secondary | ICD-10-CM | POA: Insufficient documentation

## 2016-11-26 DIAGNOSIS — K219 Gastro-esophageal reflux disease without esophagitis: Secondary | ICD-10-CM | POA: Diagnosis not present

## 2016-11-26 DIAGNOSIS — M069 Rheumatoid arthritis, unspecified: Secondary | ICD-10-CM | POA: Diagnosis not present

## 2016-11-26 DIAGNOSIS — R1030 Lower abdominal pain, unspecified: Secondary | ICD-10-CM | POA: Insufficient documentation

## 2016-11-27 ENCOUNTER — Ambulatory Visit
Admission: RE | Admit: 2016-11-27 | Discharge: 2016-11-27 | Disposition: A | Discharge status: Home / Self Care | Payer: Medicare HMO | Source: Ambulatory Visit | Attending: Radiation Oncology | Admitting: Radiation Oncology

## 2016-11-27 DIAGNOSIS — R300 Dysuria: Secondary | ICD-10-CM | POA: Diagnosis not present

## 2016-11-27 DIAGNOSIS — E7800 Pure hypercholesterolemia, unspecified: Secondary | ICD-10-CM | POA: Diagnosis not present

## 2016-11-27 DIAGNOSIS — R1030 Lower abdominal pain, unspecified: Secondary | ICD-10-CM | POA: Diagnosis not present

## 2016-11-27 DIAGNOSIS — K219 Gastro-esophageal reflux disease without esophagitis: Secondary | ICD-10-CM | POA: Diagnosis not present

## 2016-11-27 DIAGNOSIS — I10 Essential (primary) hypertension: Secondary | ICD-10-CM | POA: Diagnosis not present

## 2016-11-27 DIAGNOSIS — N390 Urinary tract infection, site not specified: Secondary | ICD-10-CM | POA: Diagnosis not present

## 2016-11-27 DIAGNOSIS — M069 Rheumatoid arthritis, unspecified: Secondary | ICD-10-CM | POA: Diagnosis not present

## 2016-11-27 DIAGNOSIS — Z510 Encounter for antineoplastic radiation therapy: Secondary | ICD-10-CM | POA: Diagnosis not present

## 2016-11-27 DIAGNOSIS — C61 Malignant neoplasm of prostate: Secondary | ICD-10-CM | POA: Diagnosis not present

## 2016-11-28 ENCOUNTER — Ambulatory Visit
Admission: RE | Admit: 2016-11-28 | Discharge: 2016-11-28 | Disposition: A | Discharge status: Home / Self Care | Payer: Medicare HMO | Source: Ambulatory Visit | Attending: Radiation Oncology | Admitting: Radiation Oncology

## 2016-11-28 ENCOUNTER — Other Ambulatory Visit: Payer: Self-pay | Admitting: Radiation Oncology

## 2016-11-28 ENCOUNTER — Other Ambulatory Visit: Payer: Self-pay | Admitting: Urology

## 2016-11-28 DIAGNOSIS — N390 Urinary tract infection, site not specified: Secondary | ICD-10-CM | POA: Diagnosis not present

## 2016-11-28 DIAGNOSIS — Z510 Encounter for antineoplastic radiation therapy: Secondary | ICD-10-CM | POA: Diagnosis not present

## 2016-11-28 DIAGNOSIS — R300 Dysuria: Secondary | ICD-10-CM | POA: Diagnosis not present

## 2016-11-28 DIAGNOSIS — E7800 Pure hypercholesterolemia, unspecified: Secondary | ICD-10-CM | POA: Diagnosis not present

## 2016-11-28 DIAGNOSIS — M069 Rheumatoid arthritis, unspecified: Secondary | ICD-10-CM | POA: Diagnosis not present

## 2016-11-28 DIAGNOSIS — C61 Malignant neoplasm of prostate: Secondary | ICD-10-CM

## 2016-11-28 DIAGNOSIS — K219 Gastro-esophageal reflux disease without esophagitis: Secondary | ICD-10-CM | POA: Diagnosis not present

## 2016-11-28 DIAGNOSIS — R1030 Lower abdominal pain, unspecified: Secondary | ICD-10-CM | POA: Diagnosis not present

## 2016-11-28 DIAGNOSIS — I10 Essential (primary) hypertension: Secondary | ICD-10-CM | POA: Diagnosis not present

## 2016-11-28 LAB — URINALYSIS, MICROSCOPIC - CHCC
BLOOD: NEGATIVE
Bilirubin (Urine): NEGATIVE
GLUCOSE UR CHCC: NEGATIVE mg/dL
Ketones: NEGATIVE mg/dL
NITRITE: NEGATIVE
PH: 5 (ref 4.6–8.0)
PROTEIN: 30 mg/dL
Specific Gravity, Urine: 1.02 (ref 1.003–1.035)
Urobilinogen, UR: 0.2 mg/dL (ref 0.2–1)

## 2016-11-28 MED ORDER — CIPROFLOXACIN HCL 500 MG PO TABS: 500.0000 mg | ORAL_TABLET | Freq: Two times a day (BID) | ORAL | 0 refills | Status: DC

## 2016-11-28 MED ORDER — TAMSULOSIN HCL 0.4 MG PO CAPS: 0.4000 mg | ORAL_CAPSULE | Freq: Every day | ORAL | 2 refills | Status: DC

## 2016-11-28 MED ORDER — PHENAZOPYRIDINE HCL 200 MG PO TABS: 200.0000 mg | ORAL_TABLET | Freq: Three times a day (TID) | ORAL | 0 refills | Status: DC | PRN

## 2016-11-30 LAB — URINE CULTURE

## 2016-12-01 ENCOUNTER — Telehealth: Payer: Self-pay | Admitting: Radiation Oncology

## 2016-12-01 ENCOUNTER — Ambulatory Visit
Admission: RE | Admit: 2016-12-01 | Discharge: 2016-12-01 | Disposition: A | Discharge status: Home / Self Care | Payer: Medicare HMO | Source: Ambulatory Visit | Attending: Radiation Oncology | Admitting: Radiation Oncology

## 2016-12-01 DIAGNOSIS — N390 Urinary tract infection, site not specified: Secondary | ICD-10-CM | POA: Diagnosis not present

## 2016-12-01 DIAGNOSIS — E7800 Pure hypercholesterolemia, unspecified: Secondary | ICD-10-CM | POA: Diagnosis not present

## 2016-12-01 DIAGNOSIS — R1030 Lower abdominal pain, unspecified: Secondary | ICD-10-CM | POA: Diagnosis not present

## 2016-12-01 DIAGNOSIS — C61 Malignant neoplasm of prostate: Secondary | ICD-10-CM | POA: Diagnosis not present

## 2016-12-01 DIAGNOSIS — M069 Rheumatoid arthritis, unspecified: Secondary | ICD-10-CM | POA: Diagnosis not present

## 2016-12-01 DIAGNOSIS — R300 Dysuria: Secondary | ICD-10-CM | POA: Diagnosis not present

## 2016-12-01 DIAGNOSIS — I10 Essential (primary) hypertension: Secondary | ICD-10-CM | POA: Diagnosis not present

## 2016-12-01 DIAGNOSIS — Z510 Encounter for antineoplastic radiation therapy: Secondary | ICD-10-CM | POA: Diagnosis not present

## 2016-12-01 DIAGNOSIS — K219 Gastro-esophageal reflux disease without esophagitis: Secondary | ICD-10-CM | POA: Diagnosis not present

## 2016-12-01 NOTE — Telephone Encounter (Signed)
Phoned patient to inform him of his urine test result and determine which pharmacy he would like his antibiotic called to. No answer. Left message requesting return call.

## 2016-12-01 NOTE — Progress Notes (Signed)
Culture is back. It appears it would be sensitive to Bactrim. Would you please confirm this before I call it in. Thank you. Sam

## 2016-12-01 NOTE — Telephone Encounter (Signed)
-----   Message from Hayden Pedro, [place] sent at 12/01/2016  2:48 PM EDT ----- Yes please call in Bactrim! ----- Message ----- From: Heywood Footman, RN Sent: 12/01/2016  11:22 AM To: Hayden Pedro, PA-C  Culture is back. It appears it would be sensitive to Bactrim. Would you please confirm this before I call it in. Thank you. Sam

## 2016-12-02 ENCOUNTER — Ambulatory Visit
Admission: RE | Admit: 2016-12-02 | Discharge: 2016-12-02 | Disposition: A | Discharge status: Home / Self Care | Payer: Medicare HMO | Source: Ambulatory Visit | Attending: Radiation Oncology | Admitting: Radiation Oncology

## 2016-12-02 ENCOUNTER — Telehealth: Payer: Self-pay | Admitting: Urology

## 2016-12-02 DIAGNOSIS — R1030 Lower abdominal pain, unspecified: Secondary | ICD-10-CM | POA: Diagnosis not present

## 2016-12-02 DIAGNOSIS — Z510 Encounter for antineoplastic radiation therapy: Secondary | ICD-10-CM | POA: Diagnosis not present

## 2016-12-02 DIAGNOSIS — R300 Dysuria: Secondary | ICD-10-CM

## 2016-12-02 DIAGNOSIS — I10 Essential (primary) hypertension: Secondary | ICD-10-CM | POA: Diagnosis not present

## 2016-12-02 DIAGNOSIS — C61 Malignant neoplasm of prostate: Secondary | ICD-10-CM | POA: Diagnosis not present

## 2016-12-02 DIAGNOSIS — K219 Gastro-esophageal reflux disease without esophagitis: Secondary | ICD-10-CM | POA: Diagnosis not present

## 2016-12-02 DIAGNOSIS — E7800 Pure hypercholesterolemia, unspecified: Secondary | ICD-10-CM | POA: Diagnosis not present

## 2016-12-02 DIAGNOSIS — M069 Rheumatoid arthritis, unspecified: Secondary | ICD-10-CM | POA: Diagnosis not present

## 2016-12-02 DIAGNOSIS — N390 Urinary tract infection, site not specified: Secondary | ICD-10-CM

## 2016-12-02 MED ORDER — DOXYCYCLINE HYCLATE 100 MG PO TABS: 100.0000 mg | ORAL_TABLET | Freq: Two times a day (BID) | ORAL | 0 refills | Status: DC

## 2016-12-02 NOTE — Telephone Encounter (Signed)
I called and spoke with patient to inform of recent urine culture results.  Patient continues with severe dysuria, urgency and frequency despite taking Cipro, Pyridium and Flomax.  Unfortunately, the bacteria is resistant to Cipro which likely accounts for the fact that he has not had any relief with abx. I advised to discontinue to Cipro and start Doxycycline 100mg  po BID which was sent to his pharmacy, [facility] in [place].  We will follow up with him later this week as planned and see how his sxs are responding.  He knows to call us immediately if sxs worsen in the interim.   Nicholos Johns, PA-C

## 2016-12-03 ENCOUNTER — Ambulatory Visit
Admission: RE | Admit: 2016-12-03 | Discharge: 2016-12-03 | Disposition: A | Discharge status: Home / Self Care | Payer: Medicare HMO | Source: Ambulatory Visit | Attending: Radiation Oncology | Admitting: Radiation Oncology

## 2016-12-03 ENCOUNTER — Other Ambulatory Visit: Payer: Self-pay | Admitting: Radiation Oncology

## 2016-12-03 DIAGNOSIS — C61 Malignant neoplasm of prostate: Secondary | ICD-10-CM

## 2016-12-03 DIAGNOSIS — K219 Gastro-esophageal reflux disease without esophagitis: Secondary | ICD-10-CM | POA: Diagnosis not present

## 2016-12-03 DIAGNOSIS — M069 Rheumatoid arthritis, unspecified: Secondary | ICD-10-CM | POA: Diagnosis not present

## 2016-12-03 DIAGNOSIS — N390 Urinary tract infection, site not specified: Secondary | ICD-10-CM | POA: Diagnosis not present

## 2016-12-03 DIAGNOSIS — E7800 Pure hypercholesterolemia, unspecified: Secondary | ICD-10-CM | POA: Diagnosis not present

## 2016-12-03 DIAGNOSIS — N3000 Acute cystitis without hematuria: Secondary | ICD-10-CM

## 2016-12-03 DIAGNOSIS — R300 Dysuria: Secondary | ICD-10-CM | POA: Diagnosis not present

## 2016-12-03 DIAGNOSIS — I10 Essential (primary) hypertension: Secondary | ICD-10-CM | POA: Diagnosis not present

## 2016-12-03 DIAGNOSIS — Z510 Encounter for antineoplastic radiation therapy: Secondary | ICD-10-CM | POA: Diagnosis not present

## 2016-12-03 DIAGNOSIS — R1030 Lower abdominal pain, unspecified: Secondary | ICD-10-CM | POA: Diagnosis not present

## 2016-12-03 MED ORDER — SULFAMETHOXAZOLE-TRIMETHOPRIM 800-160 MG PO TABS: 1.0000 | ORAL_TABLET | Freq: Two times a day (BID) | ORAL | 0 refills | Status: DC

## 2016-12-04 ENCOUNTER — Emergency Department (HOSPITAL_COMMUNITY): Payer: Medicare HMO

## 2016-12-04 ENCOUNTER — Other Ambulatory Visit: Payer: Self-pay | Admitting: Urology

## 2016-12-04 ENCOUNTER — Emergency Department (HOSPITAL_COMMUNITY)
Admission: EM | Admit: 2016-12-04 | Discharge: 2016-12-04 | Disposition: A | Discharge status: Home / Self Care | Payer: Medicare HMO | Attending: Emergency Medicine | Admitting: Emergency Medicine

## 2016-12-04 ENCOUNTER — Ambulatory Visit
Admission: RE | Admit: 2016-12-04 | Discharge: 2016-12-04 | Disposition: A | Discharge status: Home / Self Care | Payer: Medicare HMO | Source: Ambulatory Visit | Attending: Radiation Oncology | Admitting: Radiation Oncology

## 2016-12-04 VITALS — BP 148/98 | HR 90 | Temp 98.0°F | Resp 20

## 2016-12-04 DIAGNOSIS — I10 Essential (primary) hypertension: Secondary | ICD-10-CM | POA: Insufficient documentation

## 2016-12-04 DIAGNOSIS — N3289 Other specified disorders of bladder: Secondary | ICD-10-CM

## 2016-12-04 DIAGNOSIS — C61 Malignant neoplasm of prostate: Secondary | ICD-10-CM

## 2016-12-04 DIAGNOSIS — R350 Frequency of micturition: Secondary | ICD-10-CM

## 2016-12-04 DIAGNOSIS — E7800 Pure hypercholesterolemia, unspecified: Secondary | ICD-10-CM | POA: Diagnosis not present

## 2016-12-04 DIAGNOSIS — M069 Rheumatoid arthritis, unspecified: Secondary | ICD-10-CM | POA: Diagnosis not present

## 2016-12-04 DIAGNOSIS — N390 Urinary tract infection, site not specified: Secondary | ICD-10-CM | POA: Diagnosis not present

## 2016-12-04 DIAGNOSIS — Z8546 Personal history of malignant neoplasm of prostate: Secondary | ICD-10-CM | POA: Diagnosis not present

## 2016-12-04 DIAGNOSIS — R300 Dysuria: Secondary | ICD-10-CM | POA: Diagnosis not present

## 2016-12-04 DIAGNOSIS — Z87891 Personal history of nicotine dependence: Secondary | ICD-10-CM | POA: Diagnosis not present

## 2016-12-04 DIAGNOSIS — N3000 Acute cystitis without hematuria: Secondary | ICD-10-CM | POA: Insufficient documentation

## 2016-12-04 DIAGNOSIS — R1030 Lower abdominal pain, unspecified: Secondary | ICD-10-CM | POA: Diagnosis not present

## 2016-12-04 DIAGNOSIS — Z510 Encounter for antineoplastic radiation therapy: Secondary | ICD-10-CM | POA: Diagnosis not present

## 2016-12-04 DIAGNOSIS — K219 Gastro-esophageal reflux disease without esophagitis: Secondary | ICD-10-CM | POA: Diagnosis not present

## 2016-12-04 DIAGNOSIS — R319 Hematuria, unspecified: Secondary | ICD-10-CM | POA: Diagnosis not present

## 2016-12-04 LAB — CBC WITH DIFFERENTIAL/PLATELET
Basophils Absolute: 0 10*3/uL (ref 0.0–0.1)
Basophils Relative: 0 %
Eosinophils Absolute: 0.3 10*3/uL (ref 0.0–0.7)
Eosinophils Relative: 5 %
HCT: 35.8 % — ABNORMAL LOW (ref 39.0–52.0)
HEMOGLOBIN: 11.7 g/dL — AB (ref 13.0–17.0)
LYMPHS ABS: 0.6 10*3/uL — AB (ref 0.7–4.0)
Lymphocytes Relative: 10 %
MCH: 27.6 pg (ref 26.0–34.0)
MCHC: 32.7 g/dL (ref 30.0–36.0)
MCV: 84.4 fL (ref 78.0–100.0)
MONO ABS: 0.4 10*3/uL (ref 0.1–1.0)
MONOS PCT: 6 %
Neutro Abs: 4.7 10*3/uL (ref 1.7–7.7)
Neutrophils Relative %: 79 %
Platelets: 277 10*3/uL (ref 150–400)
RBC: 4.24 MIL/uL (ref 4.22–5.81)
RDW: 16.5 % — ABNORMAL HIGH (ref 11.5–15.5)
WBC: 5.9 10*3/uL (ref 4.0–10.5)

## 2016-12-04 LAB — I-STAT CHEM 8, ED
BUN: 22 mg/dL — AB (ref 6–20)
CALCIUM ION: 1.29 mmol/L (ref 1.15–1.40)
CREATININE: 0.9 mg/dL (ref 0.61–1.24)
Chloride: 107 mmol/L (ref 101–111)
Glucose, Bld: 100 mg/dL — ABNORMAL HIGH (ref 65–99)
HCT: 36 % — ABNORMAL LOW (ref 39.0–52.0)
Hemoglobin: 12.2 g/dL — ABNORMAL LOW (ref 13.0–17.0)
Potassium: 3.9 mmol/L (ref 3.5–5.1)
SODIUM: 142 mmol/L (ref 135–145)
TCO2: 25 mmol/L (ref 0–100)

## 2016-12-04 MED ORDER — HYDROMORPHONE HCL 1 MG/ML IJ SOLN
1.0000 mg | Freq: Once | INTRAMUSCULAR | Status: AC
  Administered 2016-12-04: 1 mg via INTRAVENOUS
  Filled 2016-12-04: qty 1

## 2016-12-04 MED ORDER — MORPHINE SULFATE 4 MG/ML IJ SOLN
4.0000 mg | Freq: Once | INTRAMUSCULAR | Status: AC
  Administered 2016-12-04: 4 mg via INTRAMUSCULAR

## 2016-12-04 MED ORDER — HYDROCODONE-ACETAMINOPHEN 5-325 MG PO TABS: 1.0000 | ORAL_TABLET | Freq: Four times a day (QID) | ORAL | 0 refills | Status: DC | PRN

## 2016-12-04 MED ORDER — PHENAZOPYRIDINE HCL 200 MG PO TABS
200.0000 mg | ORAL_TABLET | Freq: Three times a day (TID) | ORAL | 0 refills | Status: DC
Start: 2016-12-04 — End: 2016-12-09

## 2016-12-04 MED ORDER — URIBEL 118 MG PO CAPS: 1.0000 | ORAL_CAPSULE | Freq: Four times a day (QID) | ORAL | 1 refills | Status: DC | PRN

## 2016-12-04 MED ORDER — SULFAMETHOXAZOLE-TRIMETHOPRIM 800-160 MG PO TABS
2.0000 | ORAL_TABLET | Freq: Once | ORAL | Status: AC
  Administered 2016-12-04: 2 via ORAL
  Filled 2016-12-04: qty 2

## 2016-12-04 MED ORDER — MORPHINE SULFATE (PF) 4 MG/ML IV SOLN
4.0000 mg | INTRAVENOUS | Status: DC | PRN
  Administered 2016-12-04: 4 mg via INTRAVENOUS
  Filled 2016-12-04: qty 1

## 2016-12-04 MED ORDER — HYDROCODONE-ACETAMINOPHEN 5-325 MG PO TABS
1.0000 | ORAL_TABLET | Freq: Once | ORAL | Status: AC
  Administered 2016-12-04: 1 via ORAL
  Filled 2016-12-04 (×2): qty 1

## 2016-12-04 NOTE — Discharge Instructions (Signed)
Take the meds prescribed.  Please return to the ER if your symptoms worsen; you have increased pain, fevers, chills, inability to keep any medications down, confusion. Otherwise see the outpatient doctor as requested.

## 2016-12-04 NOTE — ED Notes (Signed)
Pt called for triage, not in lobby.

## 2016-12-04 NOTE — ED Triage Notes (Signed)
Pt was given 4mg  morphine IM before arriving to ER.

## 2016-12-04 NOTE — Progress Notes (Signed)
Radiation Oncology         [phone] ________________________________  Initial outpatient Consultation  Name: [place] MRN: [id number]  Date: 12/04/2016  DOB: 10-02-1945  CC:No PCP Per Patient  Charles Clines, MD   REFERRING PHYSICIAN: Carolan Clines, MD  DIAGNOSIS: 71 y.o. gentleman with stage T1c adenocarcinoma of the prostate with a Gleason's score of 4+4 and a PSA of 7.6  No diagnosis found.  HISTORY OF PRESENT ILLNESS:[place] is a 71 y.o. gentleman with a new diagnosis of prostate cancer.  He was noted to have an elevated PSA of 7.6 in April of 2017 by his primary care physician, Dr. Lisbeth [place].  Accordingly, he was referred for evaluation in urology by Dr. Gaynelle [place], and on 08/19/16,  digital rectal examination was performed at that time revealing no nodularity.  The patient proceeded to transrectal ultrasound with 12 biopsies of the prostate on 08/19/16. The prostate volume measured 36.14 cc.  Out of 12 core biopsies, 5 were positive.  The maximum Gleason score was 4+4=8, and this was seen in right mid lateral portion of the gland. The patient started IMRT on 11/03/16.  Patient developed significant dysuria, urgency and frequency of urination on 11/28/16.  Urinalysis was suspicious for infection.  Patient was started on Cipro, pyridium and Flomax. Urine Culture returned 50-100K E. Coli- resistant to Cipro.  Patient was switched to culture directed Doxycycline 100mg  po BID on 12/01/16.  He has continued with severe dysuria, urgency and frequency despite treatment and presents today with pain 10/10, intermittently.  A foley catheter was inserted in the office to check PVR and only returned 70ml urine, non-bloody.  Catheter was removed.    On review of systems,the patient reports persistent dysuria, bladder spasms, lower abdominal pain and urgency of urination.  He denies gross hematuria, fever or chills.  He has a history of kidney stones and feels like this pain is  similar to when he had stones previously. His last stone was able to pass approximately 3-4 years ago.  He reported incomplete emptying, frequency, intermittency, urgency, weak stream ,straining, occasional dysuria, and nocturia x 4 prior to beginning IMRT for prostate cancer. He reports mild-moderate ED. He had some scant hematuria after prostate biopsy which has resolved.  He denies nausea/vomiting or diarrhea. He notes pain due to effects of RA. Of note, patient is on methotrexate for RA.  PREVIOUS RADIATION THERAPY: No  PAST MEDICAL HISTORY:  Past Medical History:  Diagnosis Date  . GERD (gastroesophageal reflux disease)   . Hypercholesterolemia   . Hypertension   . Prostate cancer ([place])   . Rheumatoid arthritis ([place])    RA      PAST SURGICAL HISTORY: Past Surgical History:  Procedure Laterality Date  . cysto uretero remove stone  2009  . cystoscopy insert stent  2009  . PROSTATE BIOPSY    . ulnar nerve release Right 2008    FAMILY HISTORY:  Family History  Problem Relation Age of Onset  . Cancer Maternal Aunt   . Cancer Paternal Grandfather     SOCIAL HISTORY:  reports that he quit smoking about 16 years ago. His smoking use included Cigarettes. He has a 7.50 pack-year smoking history. He has never used smokeless tobacco. He reports that he does not drink alcohol or use drugs. The patient is in a relationship and resides in [place], [place].  ALLERGIES: Patient has no known allergies.  MEDICATIONS:  Current Outpatient Prescriptions  Medication Sig Dispense Refill  . amoxicillin (AMOXIL)  875 MG tablet Take 875 mg by mouth 2 (two) times daily.    Marland Kitchen doxycycline (VIBRA-TABS) 100 MG tablet Take 1 tablet (100 mg total) by mouth 2 (two) times daily. 20 tablet 0  . folic acid (FOLVITE) 1 MG tablet Take 1 mg by mouth daily.    Marland Kitchen HYDROcodone-acetaminophen (NORCO/VICODIN) 5-325 MG per tablet Take 1 tablet by mouth every 6 (six) hours as needed for pain. (Patient not taking: Reported on  08/28/2016) 15 tablet 0  . lansoprazole (PREVACID) 30 MG capsule Take 30 mg by mouth daily at 12 noon.    . Meth-Hyo-M Bl-Na Phos-Ph Sal (URIBEL) 118 MG CAPS Take 1 capsule (118 mg total) by mouth every 6 (six) hours as needed. 40 capsule 1  . methotrexate (RHEUMATREX) 5 MG tablet Take 5 mg by mouth once a week. Caution: Chemotherapy. Protect from light.    . naproxen (NAPROSYN) 500 MG tablet Take 500 mg by mouth 2 (two) times daily as needed (arthritis pain).    . phenazopyridine (PYRIDIUM) 200 MG tablet Take 1 tablet (200 mg total) by mouth 3 (three) times daily as needed for pain. 10 tablet 0  . Polyethyl Glycol-Propyl Glycol (SYSTANE OP) Place 1-2 drops into both eyes daily as needed (eye irritation).    . predniSONE (DELTASONE) 5 MG tablet Take 5 mg by mouth daily with breakfast. PRN    . sulfamethoxazole-trimethoprim (BACTRIM DS,SEPTRA DS) 800-160 MG tablet Take 1 tablet by mouth 2 (two) times daily. 20 tablet 0  . tamsulosin (FLOMAX) 0.4 MG CAPS capsule Take 1 capsule (0.4 mg total) by mouth daily after supper. 30 capsule 2   No current facility-administered medications for this encounter.      PHYSICAL EXAM:    oral temperature is 98 F (36.7 C). His blood pressure is 148/98 (abnormal) and his pulse is 90. His respiration is 20 and oxygen saturation is 100%.   In general this is a well appearing african [place] man in no acute distress.  He has episodes of lower abdominal pain that cause him to double over and cannot find a position of comfort until the pain subsides.   The pain lasts approximately 3-5 minutes. No CVA tenderness. He's alert and oriented x4 and appropriate throughout the examination. Cardiopulmonary assessment is negative for acute distress and he exhibits normal effort.    LABORATORY DATA:  Lab Results  Component Value Date   WBC 6.6 01/21/2013   HGB 12.9 (L) 01/21/2013   HCT 39.2 01/21/2013   MCV 83.1 01/21/2013   PLT 293 01/21/2013   Lab Results  Component  Value Date   NA 138 01/21/2013   K 4.5 01/21/2013   CL 104 01/21/2013   CO2 26 01/21/2013   Lab Results  Component Value Date   ALT 19 01/21/2013   AST 21 01/21/2013   ALKPHOS 72 01/21/2013   BILITOT 0.3 01/21/2013     RADIOGRAPHY: No results found.    IMPRESSION/PLAN:  25. 71 yo gentleman with Stage T1c adenocarcinoma of the prostate with a Gleason's score of 4+4 and a PSA of 7.6. Radiation treatments are currently on hold due to patient's discomfort.  2. Uncontrolled, lower abdominal pain.  Patient given 4mg  Morphine IM in the office for pain control.  We have called ahead to alert the ER that we are sending him over for further evaluation of the lower abdominal pain and r/o kidney stone.  Spoke with Dr. Laverta Baltimore in [place].  3. UTI/Dysuria. Escript sent for Uribel prn dysuria.  This has worked well for him in the past.  He is advised to complete Doxycycline 100mg  po BID as prescribed for UTI.  D/C Pyridium.  Continue Flomax prn.  4. Will plan to resume radiation treatments once his symptoms are better controlled.  Tentatively planning for treatment tomorrow.  He is advised to call the office in the morning if he is unable to come for treatment.

## 2016-12-04 NOTE — ED Triage Notes (Signed)
Pt from cancer center.  Pt gets daily radiation for prostate cancer.  Pt started having what the cancer center calls "bladder spasms".  Pt c/o pain to end of penis that comes every 30 minutes.  Pt in severe pain during spasm but then recovers after about 2 minutes.  Cancer center did a residual void and did not get anything.

## 2016-12-04 NOTE — ED Provider Notes (Signed)
[facility] DEPT Provider Note   CSN: [id number] Arrival date & time: 12/04/16  1649     History   Chief Complaint Chief Complaint  Patient presents with  . Groin Pain    HPI [place] is a 71 y.o. male.  HPI Pt sent here from the cancer center for pain. Pt has hx of prostate CA, he is undergoing radiation. He also gets im MTX. Pt comes in stating that he has been having pain every 30 minutes. Pain when there is severe. Pt reports that he also has burning with urination and he was diagnosed with UTI recently. Pt has no back pain. Pt has hx of renal stones and the pain reminds him of that. Pt denies n/v/f/c.    Past Medical History:  Diagnosis Date  . GERD (gastroesophageal reflux disease)   . Hypercholesterolemia   . Hypertension   . Prostate cancer ([place])   . Rheumatoid arthritis ([place])    RA    Patient Active Problem List   Diagnosis Date Noted  . Malignant neoplasm of prostate ([place]) 08/28/2016    Past Surgical History:  Procedure Laterality Date  . cysto uretero remove stone  2009  . cystoscopy insert stent  2009  . PROSTATE BIOPSY    . ulnar nerve release Right 2008       Home Medications    Prior to Admission medications   Medication Sig Start Date End Date Taking? Authorizing Provider  doxycycline (VIBRA-TABS) 100 MG tablet Take 1 tablet (100 mg total) by mouth 2 (two) times daily. 12/02/16  Yes [name], PA-C  folic acid (FOLVITE) 1 MG tablet Take 1 mg by mouth daily.   Yes Historical Provider, MD  HYDROcodone-acetaminophen (NORCO/VICODIN) 5-325 MG tablet Take 1 tablet by mouth every 6 (six) hours as needed. 12/04/16   Varney Biles, MD  Meth-Hyo-M Barnett Hatter Phos-Ph Sal (URIBEL) 118 MG CAPS Take 1 capsule (118 mg total) by mouth every 6 (six) hours as needed. 12/04/16   [name], PA-C  methotrexate 50 MG/2ML injection Inject 25 mg/m2 into the vein once a week.  11/26/16   Historical Provider, MD  naproxen (NAPROSYN) 500 MG tablet Take 500  mg by mouth 2 (two) times daily as needed (arthritis pain).    Historical Provider, MD  phenazopyridine (PYRIDIUM) 200 MG tablet Take 1 tablet (200 mg total) by mouth 3 (three) times daily. 12/04/16   Varney Biles, MD  Polyethyl Glycol-Propyl Glycol (SYSTANE OP) Place 1-2 drops into both eyes daily as needed (eye irritation).    Historical Provider, MD  predniSONE (DELTASONE) 5 MG tablet Take 5 mg by mouth daily as needed (pain). PRN     Historical Provider, MD  sulfamethoxazole-trimethoprim (BACTRIM DS,SEPTRA DS) 800-160 MG tablet Take 1 tablet by mouth 2 (two) times daily. Patient not taking: Reported on 12/04/2016 12/03/16   [name], MD  tamsulosin (FLOMAX) 0.4 MG CAPS capsule Take 1 capsule (0.4 mg total) by mouth daily after supper. 11/28/16   [name], PA-C    Family History Family History  Problem Relation Age of Onset  . Cancer Maternal Aunt   . Cancer Paternal Grandfather     Social History Social History  Substance Use Topics  . Smoking status: Former Smoker    Packs/day: 0.25    Years: 30.00    Types: Cigarettes    Quit date: 05/09/2000  . Smokeless tobacco: Never Used  . Alcohol use No     Allergies   Patient has no known  allergies.   Review of Systems Review of Systems  ROS 10 Systems reviewed and are negative for acute change except as noted in the HPI.     Physical Exam Updated Vital Signs BP (!) 146/84   Pulse 81   Temp 97.8 F (36.6 C) (Oral)   Resp 17   SpO2 96%   Physical Exam  Constitutional: He is oriented to person, place, and time. He appears well-developed.  HENT:  Head: Normocephalic and atraumatic.  Eyes: Conjunctivae and EOM are normal. Pupils are equal, round, and reactive to light.  Neck: Normal range of motion. Neck supple.  Cardiovascular: Normal rate and regular rhythm.   Pulmonary/Chest: Effort normal and breath sounds normal.  Abdominal: Soft. Bowel sounds are normal. He exhibits no distension. There is no  tenderness.  Neurological: He is alert and oriented to person, place, and time.  Skin: Skin is warm.  Nursing note and vitals reviewed.    ED Treatments / Results  Labs (all labs ordered are listed, but only abnormal results are displayed) Labs Reviewed  CBC WITH DIFFERENTIAL/PLATELET - Abnormal; Notable for the following:       Result Value   Hemoglobin 11.7 (*)    HCT 35.8 (*)    RDW 16.5 (*)    Lymphs Abs 0.6 (*)    All other components within normal limits  I-STAT CHEM 8, ED    EKG  EKG Interpretation None       Radiology Ct Renal Stone Study  Result Date: 12/04/2016 CLINICAL DATA:  Hematuria.  Bladder spasms. EXAM: CT ABDOMEN AND PELVIS WITHOUT CONTRAST TECHNIQUE: Multidetector CT imaging of the abdomen and pelvis was performed following the standard protocol without IV contrast. COMPARISON:  08/08/2016 FINDINGS: Lower chest:  Unremarkable. Hepatobiliary: Tiny hypoattenuating lesions again identified inferior right liver, stable. There is no evidence for gallstones, gallbladder wall thickening, or pericholecystic fluid. No intrahepatic or extrahepatic biliary dilation. Pancreas: Scattered parenchymal calcification in the pancreatic parenchyma suggests possible chronic pancreatitis. Spleen: No splenomegaly. No focal mass lesion. Adrenals/Urinary Tract: No adrenal nodule or mass. Patient had multiple bilateral nonobstructing renal stones on the previous study which have decreased in the interval. Several very tiny nonobstructing stones are seen in the right kidney (best seen on coronal images 90, 89, 81 and 76). 1-2 mm nonobstructing stone identified upper pole left kidney on coronal images. No ureteral or bladder stones. Circumferential bladder wall thickening is most pronounced posteriorly adjacent to the prostate gland. Stomach/Bowel: Stomach is nondistended. No gastric wall thickening. No evidence of outlet obstruction. Duodenum is normally positioned as is the ligament of  Treitz. No small bowel wall thickening. No small bowel dilatation. The terminal ileum is normal. The appendix is normal. Diverticular changes are noted in the left colon without evidence of diverticulitis. Vascular/Lymphatic: There is abdominal aortic atherosclerosis without aneurysm. There is no gastrohepatic or hepatoduodenal ligament lymphadenopathy. No intraperitoneal or retroperitoneal lymphadenopathy. No pelvic sidewall lymphadenopathy. Reproductive: Brachytherapy seeds noted in the prostate gland. Other: No intraperitoneal free fluid. Musculoskeletal: Left groin hernia contains only fat. Bone windows reveal no worrisome lytic or sclerotic osseous lesions. IMPRESSION: 1. Multiple bilateral tiny nonobstructing renal stones although stone burden in the kidneys is decreased in the interval. No evidence for ureteral or bladder stones. 2. Circumferential bladder wall thickening, most pronounced posteriorly adjacent to the prostate gland. Given the history of pelvic radiation, this may be related to treatment. Bladder infection could have a similar appearance. 3.  Abdominal Aortic Atherosclerois (ICD10-170.0) Electronically Signed  By: Misty Stanley M.D.   On: 12/04/2016 19:49    Procedures Procedures (including critical care time)  Medications Ordered in ED Medications  morphine 4 MG/ML injection 4 mg (4 mg Intravenous Given 12/04/16 1825)  HYDROcodone-acetaminophen (NORCO/VICODIN) 5-325 MG per tablet 1 tablet (not administered)  sulfamethoxazole-trimethoprim (BACTRIM DS,SEPTRA DS) 800-160 MG per tablet 2 tablet (2 tablets Oral Given 12/04/16 2152)  HYDROmorphone (DILAUDID) injection 1 mg (1 mg Intravenous Given 12/04/16 2152)     Initial Impression / Assessment and Plan / ED Course  I have reviewed the triage vital signs and the nursing notes.  Pertinent labs & imaging results that were available during my care of the patient were reviewed by me and considered in my medical decision making (see  chart for details).  Clinical Course as of Dec 05 2151  Thu Dec 04, 2016  2152 CBC is reassuring. Strict ER return precautions have been discussed, and patient is agreeing with the plan and is comfortable with the workup done and the recommendations from the ER.   [AN]    Clinical Course User Index [AN] Varney Biles, MD   Pt comes in with what appears to be bladder and urethral spasm. CT renal stone ruled out stone. There is cystitis on it. Pt doesn't have rectal pain - so I doubt he has prostatitis. Addictionally, pt is not toxic appearing.  Labs ordered. Cultures from last UA reviewed. WE will try to get symptoms in control.  Final Clinical Impressions(s) / ED Diagnoses   Final diagnoses:  Acute cystitis without hematuria    New Prescriptions New Prescriptions   HYDROCODONE-ACETAMINOPHEN (NORCO/VICODIN) 5-325 MG TABLET    Take 1 tablet by mouth every 6 (six) hours as needed.   PHENAZOPYRIDINE (PYRIDIUM) 200 MG TABLET    Take 1 tablet (200 mg total) by mouth 3 (three) times daily.     Varney Biles, MD 12/04/16 2153

## 2016-12-04 NOTE — Progress Notes (Signed)
BP elevated. Bladder spasms create pain 10 on a scale of 0-10. Administered Morphine 4 mg IM left deltoid as directed by [name], PA-C. Transported patient accompanied by wife in wheelchair to emergency room 9. Provided report to Anderson Malta, Therapist, sports. Patient in no distress when I exited the room.

## 2016-12-05 ENCOUNTER — Ambulatory Visit: Payer: Medicare HMO

## 2016-12-07 ENCOUNTER — Encounter (HOSPITAL_COMMUNITY): Payer: Self-pay | Admitting: Emergency Medicine

## 2016-12-07 ENCOUNTER — Inpatient Hospital Stay (HOSPITAL_COMMUNITY)
Admission: EM | Admit: 2016-12-07 | Discharge: 2016-12-09 | DRG: 699 | Disposition: A | Discharge status: Home / Self Care | Payer: Medicare HMO | Attending: Family Medicine | Admitting: Family Medicine

## 2016-12-07 DIAGNOSIS — N3289 Other specified disorders of bladder: Secondary | ICD-10-CM | POA: Diagnosis not present

## 2016-12-07 DIAGNOSIS — N179 Acute kidney failure, unspecified: Secondary | ICD-10-CM | POA: Diagnosis not present

## 2016-12-07 DIAGNOSIS — Y842 Radiological procedure and radiotherapy as the cause of abnormal reaction of the patient, or of later complication, without mention of misadventure at the time of the procedure: Secondary | ICD-10-CM | POA: Diagnosis present

## 2016-12-07 DIAGNOSIS — N3040 Irradiation cystitis without hematuria: Principal | ICD-10-CM | POA: Diagnosis present

## 2016-12-07 DIAGNOSIS — R3989 Other symptoms and signs involving the genitourinary system: Secondary | ICD-10-CM | POA: Diagnosis present

## 2016-12-07 DIAGNOSIS — N390 Urinary tract infection, site not specified: Secondary | ICD-10-CM | POA: Diagnosis present

## 2016-12-07 DIAGNOSIS — C61 Malignant neoplasm of prostate: Secondary | ICD-10-CM | POA: Diagnosis not present

## 2016-12-07 DIAGNOSIS — N200 Calculus of kidney: Secondary | ICD-10-CM | POA: Diagnosis not present

## 2016-12-07 DIAGNOSIS — Z79899 Other long term (current) drug therapy: Secondary | ICD-10-CM

## 2016-12-07 DIAGNOSIS — D649 Anemia, unspecified: Secondary | ICD-10-CM | POA: Diagnosis present

## 2016-12-07 DIAGNOSIS — Z923 Personal history of irradiation: Secondary | ICD-10-CM

## 2016-12-07 DIAGNOSIS — Z87891 Personal history of nicotine dependence: Secondary | ICD-10-CM

## 2016-12-07 DIAGNOSIS — R1030 Lower abdominal pain, unspecified: Secondary | ICD-10-CM | POA: Diagnosis not present

## 2016-12-07 DIAGNOSIS — Z791 Long term (current) use of non-steroidal anti-inflammatories (NSAID): Secondary | ICD-10-CM

## 2016-12-07 DIAGNOSIS — E7800 Pure hypercholesterolemia, unspecified: Secondary | ICD-10-CM | POA: Diagnosis present

## 2016-12-07 DIAGNOSIS — B9620 Unspecified Escherichia coli [E. coli] as the cause of diseases classified elsewhere: Secondary | ICD-10-CM | POA: Diagnosis present

## 2016-12-07 DIAGNOSIS — I10 Essential (primary) hypertension: Secondary | ICD-10-CM | POA: Diagnosis present

## 2016-12-07 DIAGNOSIS — E869 Volume depletion, unspecified: Secondary | ICD-10-CM | POA: Diagnosis present

## 2016-12-07 DIAGNOSIS — K219 Gastro-esophageal reflux disease without esophagitis: Secondary | ICD-10-CM | POA: Diagnosis not present

## 2016-12-07 DIAGNOSIS — M069 Rheumatoid arthritis, unspecified: Secondary | ICD-10-CM | POA: Diagnosis not present

## 2016-12-07 DIAGNOSIS — Z79891 Long term (current) use of opiate analgesic: Secondary | ICD-10-CM

## 2016-12-07 DIAGNOSIS — Z1623 Resistance to quinolones and fluoroquinolones: Secondary | ICD-10-CM | POA: Diagnosis present

## 2016-12-07 DIAGNOSIS — Z7952 Long term (current) use of systemic steroids: Secondary | ICD-10-CM | POA: Diagnosis not present

## 2016-12-07 DIAGNOSIS — R739 Hyperglycemia, unspecified: Secondary | ICD-10-CM | POA: Diagnosis not present

## 2016-12-07 DIAGNOSIS — N3090 Cystitis, unspecified without hematuria: Secondary | ICD-10-CM | POA: Diagnosis not present

## 2016-12-07 LAB — CBC WITH DIFFERENTIAL/PLATELET
BASOS PCT: 0 %
Basophils Absolute: 0 10*3/uL (ref 0.0–0.1)
Eosinophils Absolute: 0.4 10*3/uL (ref 0.0–0.7)
Eosinophils Relative: 6 %
HEMATOCRIT: 33.9 % — AB (ref 39.0–52.0)
Hemoglobin: 11.1 g/dL — ABNORMAL LOW (ref 13.0–17.0)
LYMPHS ABS: 0.6 10*3/uL — AB (ref 0.7–4.0)
Lymphocytes Relative: 10 %
MCH: 27.1 pg (ref 26.0–34.0)
MCHC: 32.7 g/dL (ref 30.0–36.0)
MCV: 82.9 fL (ref 78.0–100.0)
MONO ABS: 0.4 10*3/uL (ref 0.1–1.0)
MONOS PCT: 6 %
NEUTROS ABS: 4.7 10*3/uL (ref 1.7–7.7)
Neutrophils Relative %: 78 %
Platelets: 305 10*3/uL (ref 150–400)
RBC: 4.09 MIL/uL — ABNORMAL LOW (ref 4.22–5.81)
RDW: 16 % — AB (ref 11.5–15.5)
WBC: 6 10*3/uL (ref 4.0–10.5)

## 2016-12-07 LAB — COMPREHENSIVE METABOLIC PANEL
ALK PHOS: 63 U/L (ref 38–126)
ALT: 18 U/L (ref 17–63)
AST: 14 U/L — AB (ref 15–41)
Albumin: 3.3 g/dL — ABNORMAL LOW (ref 3.5–5.0)
Anion gap: 7 (ref 5–15)
BILIRUBIN TOTAL: 0.7 mg/dL (ref 0.3–1.2)
BUN: 23 mg/dL — AB (ref 6–20)
CALCIUM: 9.3 mg/dL (ref 8.9–10.3)
CO2: 23 mmol/L (ref 22–32)
CREATININE: 1.45 mg/dL — AB (ref 0.61–1.24)
Chloride: 107 mmol/L (ref 101–111)
GFR calc Af Amer: 56 mL/min — ABNORMAL LOW (ref >60)
GFR, EST NON AFRICAN AMERICAN: 48 mL/min — AB (ref >60)
GLUCOSE: 108 mg/dL — AB (ref 65–99)
Potassium: 4.1 mmol/L (ref 3.5–5.1)
SODIUM: 137 mmol/L (ref 135–145)
TOTAL PROTEIN: 7.6 g/dL (ref 6.5–8.1)

## 2016-12-07 LAB — TROPONIN I

## 2016-12-07 LAB — URINALYSIS, ROUTINE W REFLEX MICROSCOPIC
BACTERIA UA: NONE SEEN
BILIRUBIN URINE: NEGATIVE
Glucose, UA: NEGATIVE mg/dL
HGB URINE DIPSTICK: NEGATIVE
Ketones, ur: NEGATIVE mg/dL
LEUKOCYTES UA: NEGATIVE
NITRITE: NEGATIVE
Specific Gravity, Urine: 1.02 (ref 1.005–1.030)
Squamous Epithelial / LPF: NONE SEEN
pH: 5 (ref 5.0–8.0)

## 2016-12-07 LAB — I-STAT CHEM 8, ED
BUN: 21 mg/dL — AB (ref 6–20)
CALCIUM ION: 1.2 mmol/L (ref 1.15–1.40)
CREATININE: 1.2 mg/dL (ref 0.61–1.24)
Chloride: 109 mmol/L (ref 101–111)
Glucose, Bld: 113 mg/dL — ABNORMAL HIGH (ref 65–99)
HEMATOCRIT: 36 % — AB (ref 39.0–52.0)
HEMOGLOBIN: 12.2 g/dL — AB (ref 13.0–17.0)
Potassium: 3.8 mmol/L (ref 3.5–5.1)
Sodium: 140 mmol/L (ref 135–145)
TCO2: 23 mmol/L (ref 0–100)

## 2016-12-07 LAB — CBC
HEMATOCRIT: 31.8 % — AB (ref 39.0–52.0)
Hemoglobin: 10.4 g/dL — ABNORMAL LOW (ref 13.0–17.0)
MCH: 27.2 pg (ref 26.0–34.0)
MCHC: 32.7 g/dL (ref 30.0–36.0)
MCV: 83 fL (ref 78.0–100.0)
PLATELETS: 285 10*3/uL (ref 150–400)
RBC: 3.83 MIL/uL — ABNORMAL LOW (ref 4.22–5.81)
RDW: 16.1 % — AB (ref 11.5–15.5)
WBC: 6.1 10*3/uL (ref 4.0–10.5)

## 2016-12-07 MED ORDER — MIRABEGRON ER 50 MG PO TB24
50.0000 mg | ORAL_TABLET | Freq: Every day | ORAL | Status: DC
  Administered 2016-12-07 – 2016-12-09 (×3): 50 mg via ORAL
  Filled 2016-12-07 (×3): qty 1

## 2016-12-07 MED ORDER — FOLIC ACID 1 MG PO TABS
1.0000 mg | ORAL_TABLET | Freq: Every day | ORAL | Status: DC
  Administered 2016-12-07 – 2016-12-09 (×3): 1 mg via ORAL
  Filled 2016-12-07 (×3): qty 1

## 2016-12-07 MED ORDER — OXYCODONE HCL 5 MG PO TABS
10.0000 mg | ORAL_TABLET | Freq: Once | ORAL | Status: AC
  Administered 2016-12-07: 10 mg via ORAL
  Filled 2016-12-07: qty 2

## 2016-12-07 MED ORDER — HYDROCODONE-ACETAMINOPHEN 5-325 MG PO TABS
1.0000 | ORAL_TABLET | Freq: Four times a day (QID) | ORAL | Status: DC | PRN
  Administered 2016-12-07 – 2016-12-08 (×2): 1 via ORAL
  Filled 2016-12-07 (×2): qty 1

## 2016-12-07 MED ORDER — HYDROMORPHONE HCL 1 MG/ML IJ SOLN
1.0000 mg | Freq: Once | INTRAMUSCULAR | Status: AC
  Administered 2016-12-07: 1 mg via INTRAVENOUS
  Filled 2016-12-07: qty 1

## 2016-12-07 MED ORDER — TAMSULOSIN HCL 0.4 MG PO CAPS: 0.4000 mg | ORAL_CAPSULE | Freq: Every day | ORAL | Status: DC

## 2016-12-07 MED ORDER — ACETAMINOPHEN 650 MG RE SUPP: 650.0000 mg | Freq: Four times a day (QID) | RECTAL | Status: DC | PRN

## 2016-12-07 MED ORDER — DOXYCYCLINE HYCLATE 100 MG PO TABS
100.0000 mg | ORAL_TABLET | Freq: Two times a day (BID) | ORAL | Status: DC
  Administered 2016-12-07 – 2016-12-09 (×5): 100 mg via ORAL
  Filled 2016-12-07 (×5): qty 1

## 2016-12-07 MED ORDER — LIDOCAINE HCL 2 % EX GEL
1.0000 "application " | Freq: Once | CUTANEOUS | Status: AC
  Administered 2016-12-07: 1 via URETHRAL
  Filled 2016-12-07: qty 11

## 2016-12-07 MED ORDER — ACETAMINOPHEN 325 MG PO TABS
650.0000 mg | ORAL_TABLET | Freq: Four times a day (QID) | ORAL | Status: DC | PRN
  Filled 2016-12-07: qty 2

## 2016-12-07 MED ORDER — PHENAZOPYRIDINE HCL 200 MG PO TABS
200.0000 mg | ORAL_TABLET | Freq: Three times a day (TID) | ORAL | Status: DC
  Administered 2016-12-07 – 2016-12-09 (×7): 200 mg via ORAL
  Filled 2016-12-07 (×9): qty 1

## 2016-12-07 MED ORDER — HYDROMORPHONE HCL 1 MG/ML IJ SOLN
0.5000 mg | INTRAMUSCULAR | Status: DC | PRN
  Administered 2016-12-07 – 2016-12-08 (×3): 0.5 mg via INTRAVENOUS
  Filled 2016-12-07 (×2): qty 0.5
  Filled 2016-12-07: qty 1

## 2016-12-07 MED ORDER — BELLADONNA ALKALOIDS-OPIUM 16.2-60 MG RE SUPP
1.0000 | Freq: Four times a day (QID) | RECTAL | Status: DC | PRN
  Administered 2016-12-07 – 2016-12-08 (×4): 1 via RECTAL
  Filled 2016-12-07 (×4): qty 1

## 2016-12-07 MED ORDER — HYDROMORPHONE HCL 1 MG/ML IJ SOLN
0.5000 mg | INTRAMUSCULAR | Status: AC | PRN
  Administered 2016-12-07 (×2): 0.5 mg via INTRAVENOUS
  Filled 2016-12-07 (×2): qty 1

## 2016-12-07 MED ORDER — SODIUM CHLORIDE 0.9 % IV SOLN
INTRAVENOUS | Status: AC
  Administered 2016-12-07: 09:00:00 via INTRAVENOUS

## 2016-12-07 MED ORDER — OXYCODONE HCL 5 MG PO TABS
10.0000 mg | ORAL_TABLET | ORAL | Status: DC | PRN
  Administered 2016-12-07 – 2016-12-09 (×4): 10 mg via ORAL
  Filled 2016-12-07 (×4): qty 2

## 2016-12-07 MED ORDER — OXYBUTYNIN CHLORIDE 5 MG PO TABS
5.0000 mg | ORAL_TABLET | Freq: Three times a day (TID) | ORAL | Status: DC
  Administered 2016-12-07 – 2016-12-09 (×6): 5 mg via ORAL
  Filled 2016-12-07 (×6): qty 1

## 2016-12-07 MED ORDER — KETOROLAC TROMETHAMINE 30 MG/ML IJ SOLN
30.0000 mg | Freq: Once | INTRAMUSCULAR | Status: AC
  Administered 2016-12-07: 30 mg via INTRAVENOUS
  Filled 2016-12-07: qty 1

## 2016-12-07 MED ORDER — TAMSULOSIN HCL 0.4 MG PO CAPS
0.4000 mg | ORAL_CAPSULE | Freq: Every day | ORAL | Status: DC
Start: 2016-12-07 — End: 2016-12-09
  Administered 2016-12-07 – 2016-12-09 (×3): 0.4 mg via ORAL
  Filled 2016-12-07 (×3): qty 1

## 2016-12-07 MED ORDER — POLYVINYL ALCOHOL 1.4 % OP SOLN
Freq: Every day | OPHTHALMIC | Status: DC | PRN
  Filled 2016-12-07: qty 15

## 2016-12-07 NOTE — Progress Notes (Signed)
Patient from 1 [place] observation room. Alert and oriented x 3. Foley catheter intact. Per patient he experienced more bladder spasm, pain level 10/10. He was given Dilaudid 0.5 mg  before coming to unit. Per [place] RN, she will contact Dr. Karsten Ro re: this matter. Vital signs obtained. Oriented to room and environment. Will continue to monitor.

## 2016-12-07 NOTE — Consult Note (Signed)
Urology Consult  CC: Referring physician: Nita Sells, MD Reason for referral: Bladder pain  Impression/Assessment: 1. Bladder spasms - The pattern of intermittent pain that he is describing is most consistent with bladder spasms. This has begun since radiation but also after having been diagnosed with a recent UTI. His CT scan revealed a thickened bladder and therefore these symptoms are likely more pronounced because of this. There may be some degree of cystitis secondary to radiation and to his recent infection but over time I think this should resolve. In the meantime we discussed the options for treatment.  My recommendation was that he begin Myrbetriq 50 mg and to this an anticholinergic can be added if he continues to have spasms. He had significant voiding symptoms prior to beginning his radiation and I'm concerned that he may not void spontaneously if placed on this medication and I have discussed this with he and his family and the need for continued Foley catheterization upon discharge if he undergoes a voiding trial and fails. It may be best just to leave the catheter in.  2. Prostate cancer - he received neoadjuvant Lupron and is currently undergoing IM RT.    3. He had a recent UTI. He is currently on doxycycline according to sensitivities.    Plan:  1. Begin Myrbetriq 50 mg daily. 2. I would leave Foley catheter in upon discharge. 3. He will follow-up with Dr. Gaynelle Arabian for a voiding trial next week.    History of Present Illness: History Coombs this 71 year old male patient of Dr. Gaynelle Arabian with prostate cancer.  He was found to have an elevated PSA of 7.6 prompting a TRUS biopsy of the prostate on 11/101/17. This confirmed Gleason 4+3=7 adenocarcinoma of the prostate with 5 out of 12 biopsy cores positive for malignancy.    Imaging studies: CT (08/09/16) - Bilateral non-obstructing stones  Bone scan - negative    TNM stage: cT1c Nx Mx  PSA: 7.6  Gleason score:  4+4+8  Biopsy (07/18/16): 5/12 cores positive  Left:  Right: R apex (50%, 3+3=6), R lateral apex (50%, 3+4=7), R mid (30%, 4+3=7), R mid lateral (50%, 4+4=8), R lateral base (5%, 4+3=7)  Prostate volume: 36.2 cc   Lupron administered 10/07/16.  Urinary function: IPSS is 27.  Erectile function: SHIM score is 8.   He began IMRT on 11/03/16 and on 11/28/16 had developed severe dysuria, urgency and frequency.  His urine was suspicious for infection and he was started on empiric Cipro, Pyridium and Flomax.  A urine culture revealed E. coli resistant to Cipro and he was switched to doxycycline.  On 12/01/16 his symptoms persisted and he rated his pain 10/10.  A catheterized specimen revealed a PVR of only 15 cc and clear urine.   He was seen in the ER on 12/04/16 reporting pain that was episodic every 30 minutes and severe when it occurred.  It was felt at that time that he may be having bladder spasms however he was not started on an anticholinergic.  A CT scan at that time revealed bilateral, nonobstructing renal calculi with no ureteral stones or hydronephrosis.  There was circumferential bladder wall thickening. He returned to the emergency room last night with continued bladder pain that was not relieved by oral pain medication. He reports that since his Foley catheter has been inserted he has had less discomfort. The  Past Medical History:  Diagnosis Date  . GERD (gastroesophageal reflux disease)   . Hypercholesterolemia   . Hypertension   .  Prostate cancer ([place])   . Rheumatoid arthritis ([place])    RA   Past Surgical History:  Procedure Laterality Date  . cysto uretero remove stone  2009  . cystoscopy insert stent  2009  . PROSTATE BIOPSY    . ulnar nerve release Right 2008    Medications:  Prior to Admission:  Prescriptions Prior to Admission  Medication Sig Dispense Refill Last Dose  . doxycycline (VIBRA-TABS) 100 MG tablet Take 1 tablet (100 mg total) by mouth 2 (two) times daily. 20  tablet 0 12/06/2016 at 1700  . folic acid (FOLVITE) 1 MG tablet Take 1 mg by mouth daily.   Past Week at Unknown time  . phenazopyridine (PYRIDIUM) 200 MG tablet Take 1 tablet (200 mg total) by mouth 3 (three) times daily. 6 tablet 0 12/06/2016 at Unknown time  . predniSONE (DELTASONE) 5 MG tablet Take 5 mg by mouth daily as needed (pain). PRN    Past Month at Unknown time  . HYDROcodone-acetaminophen (NORCO/VICODIN) 5-325 MG tablet Take 1 tablet by mouth every 6 (six) hours as needed. (Patient not taking: Reported on 12/07/2016) 15 tablet 0 Not Taking at Unknown time  . methotrexate 50 MG/2ML injection Inject 25 mg/m2 into the vein once a week.    Not Taking at Unknown time  . tamsulosin (FLOMAX) 0.4 MG CAPS capsule Take 1 capsule (0.4 mg total) by mouth daily after supper. (Patient not taking: Reported on 12/07/2016) 30 capsule 2 Not Taking at Unknown time   Scheduled: . doxycycline  100 mg Oral BID  . folic acid  1 mg Oral Daily  . mirabegron ER  50 mg Oral Daily  . phenazopyridine  200 mg Oral TID   Continuous: . sodium chloride      Allergies: No Known Allergies  Family History  Problem Relation Age of Onset  . Cancer Maternal Aunt   . Cancer Paternal Grandfather   . Dementia Mother     Social History:  reports that he quit smoking about 16 years ago. His smoking use included Cigarettes. He has a 7.50 pack-year smoking history. He has never used smokeless tobacco. He reports that he does not drink alcohol or use drugs.  Review of Systems (10 point): Pertinent items are noted in HPI. A comprehensive review of systems was negative except as noted above.  Physical Exam:  Vital signs in last 24 hours: Temp:  [97.7 F (36.5 C)-98.2 F (36.8 C)] 97.7 F (36.5 C) (04/01 0645) Pulse Rate:  [81-106] 81 (04/01 0645) Resp:  [16-20] 16 (04/01 0645) BP: (105-158)/(76-99) 115/84 (04/01 0645) SpO2:  [95 %-99 %] 97 % (04/01 0645) Weight:  [169 lb (76.7 kg)] 169 lb (76.7 kg) (04/01  0232) General appearance: alert and appears stated age Head: Normocephalic, without obvious abnormality, atraumatic Eyes: conjunctivae/corneas clear. EOM's intact.  Oropharynx: moist mucous membranes Neck: supple, symmetrical, trachea midline Resp: normal respiratory effort Cardio: regular rate and rhythm Back: symmetric, no curvature. ROM normal. No CVA tenderness. GI: soft, non-tender; bowel sounds normal; no masses,  no organomegaly Male genitalia: penis: normal male phallus with no lesions or discharge. Foley catheter indwelling. Testes: bilaterally descended with no masses or tenderness. no hernias Extremities: extremities normal, atraumatic, no cyanosis or edema Skin: Skin color normal. No visible rashes or lesions Neurologic: Grossly normal  Laboratory Data:   Recent Labs  12/04/16 1814 12/04/16 2200 12/07/16 0331 12/07/16 0346  WBC 5.9  --  6.0  --   HGB 11.7* 12.2* 11.1* 12.2*  HCT  35.8* 36.0* 33.9* 36.0*   BMET  Recent Labs  12/04/16 2200 12/07/16 0346  NA 142 140  K 3.9 3.8  CL 107 109  GLUCOSE 100* 113*  BUN 22* 21*  CREATININE 0.90 1.20   No results for input(s): LABPT, INR in the last 72 hours. No results for input(s): LABURIN in the last 72 hours. Results for orders placed or performed during the hospital encounter of 11/28/16  Urine culture     Status: Abnormal   Collection Time: 11/28/16  4:18 PM  Result Value Ref Range Status   Urine Culture, Routine Final report (A)  Final   Urine Culture result 1 Escherichia coli (A)  Final    Comment: 50,000-100,000 colony forming units per mL   ANTIMICROBIAL SUSCEPTIBILITY Comment  Final    Comment:       ** S = Susceptible; I = Intermediate; R = Resistant **                    P = Positive; N = Negative             MICS are expressed in micrograms per mL    Antibiotic                 RSLT#1    RSLT#2    RSLT#3    RSLT#4 Amoxicillin/Clavulanic Acid    S Ampicillin                     R Cefazolin                       R Cefepime                       R Ceftriaxone                    R Cefuroxime                     R Cephalothin                    R Ciprofloxacin                  R Ertapenem                      S Gentamicin                     S Imipenem                       S Levofloxacin                   R Nitrofurantoin                 I Piperacillin                   R Tetracycline                   S Tobramycin                     S Trimethoprim/Sulfa             S    Creatinine:  Recent Labs  12/04/16 2200 12/07/16 0346  CREATININE 0.90 1.20    Imaging: No results found.  Claybon Jabs 12/07/2016, 6:47 AM

## 2016-12-07 NOTE — ED Notes (Signed)
Bed: IO03 Expected date:  Expected time:  Means of arrival:  Comments: EMS 71 yo male abdominal burning-recent UTI/[place]

## 2016-12-07 NOTE — Progress Notes (Signed)
Charles Frost VPX:[id number] DOB: [birth year] DOA: 12/07/2016 PCP: No PCP Per Patient  Brief narrative:  71 y/o ? Stage TIc adenoCa prostate cancer 12/2015-managed by Dr. 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 Seen by emergency room 3/29 felt stable and sent home only to return 4/1 with increasing pain Secondary to severe pain patient was given morphine as well as Flomax Eventually sent to emergency department 3/29  Found to have BUN/creatinine 21/1. 2 Volume depletion White count 6.1, lately it to 85, hemoglobin 10.4 down from 11-12 range  Past medical history-As per Problem list Chart reviewed as below-   Consultants:  Urology  Procedures:  None yet  Antibiotics:  Doxycycline 4/1   Subjective   Patient unhappy with amount of pain "is that all you have for me"? When I explain plan to him No fever no chills No sob Recived dilaudid 0.5 45 min ago-pain controlled but painful when passing urine    Objective    Objective: Vitals:   12/07/16 0232 12/07/16 0441 12/07/16 0622 12/07/16 0645  BP: (!) 158/99 111/76 105/82 115/84  Pulse: (!) 106 88 88 81  Resp: 20 18 17 16   Temp: 98.2 F (36.8 C) 98.2 F (36.8 C)  97.7 F (36.5 C)  TempSrc: Oral Oral Oral Oral  SpO2: 99% 97% 95% 97%  Weight: 76.7 kg (169 lb)   77.7 kg (171 lb 4.8 oz)  Height: 5\' 7"  (1.702 m)   5\' 7"  (1.702 m)   No intake or output data in the 24 hours ending  12/07/16 0729  Exam:  General: alert No pallor no ict abd soft nt nd no rebound Chest clear   Data Reviewed: Basic Metabolic Panel:  Recent Labs Lab 12/04/16 2200 12/07/16 0346  NA 142 140  K 3.9 3.8  CL 107 109  GLUCOSE 100* 113*  BUN 22* 21*  CREATININE 0.90 1.20   Liver Function Tests: No results for input(s): AST, ALT, ALKPHOS, BILITOT, PROT, ALBUMIN in the last 168 hours. No results for input(s): LIPASE, AMYLASE in the last 168 hours. No results for input(s): AMMONIA in the last 168 hours. CBC:  Recent Labs Lab 12/04/16 1814 12/04/16 2200 12/07/16 0331 12/07/16 0346 12/07/16 0703  WBC 5.9  --  6.0  --  6.1  NEUTROABS 4.7  --  4.7  --   --   HGB 11.7* 12.2* 11.1* 12.2* 10.4*  HCT 35.8* 36.0* 33.9* 36.0* 31.8*  MCV 84.4  --  82.9  --  83.0  PLT 277  --  305  --  285   Cardiac Enzymes: No results for input(s): CKTOTAL, CKMB, CKMBINDEX, TROPONINI in the last 168 hours. BNP: Invalid input(s): POCBNP CBG: No results for input(s): GLUCAP in the last 168 hours.  Recent Results (from the past 240 hour(s))  Urine culture     Status: Abnormal   Collection Time: 11/28/16  4:18 PM  Result Value Ref Range Status   Urine Culture, Routine Final report (A)  Final   Urine  Culture result 1 Escherichia coli (A)  Final    Comment: 50,000-100,000 colony forming units per mL   ANTIMICROBIAL SUSCEPTIBILITY Comment  Final    Comment:       ** S = Susceptible; I = Intermediate; R = Resistant **                    P = Positive; N = Negative             MICS are expressed in micrograms per mL    Antibiotic                 RSLT#1    RSLT#2    RSLT#3    RSLT#4 Amoxicillin/Clavulanic Acid    S Ampicillin                     R Cefazolin                      R Cefepime                       R Ceftriaxone                    R Cefuroxime                     R Cephalothin                    R Ciprofloxacin                  R Ertapenem                      S Gentamicin                      S Imipenem                       S Levofloxacin                   R Nitrofurantoin                 I Piperacillin                   R Tetracycline                   S Tobramycin                     S Trimethoprim/Sulfa             S      Studies:              All Imaging reviewed and is as per above notation   Scheduled Meds: . doxycycline  100 mg Oral BID  . folic acid  1 mg Oral Daily  . phenazopyridine  200 mg Oral TID  . tamsulosin  0.4 mg Oral QPC supper   Continuous Infusions: . sodium chloride       Assessment/Plan:   Escherichia coli cystitis in the setting of recent radiation therapy for prostate cancer-patient's Escherichia coli is susceptible to doxycycline 100 twice a day. We will continue same. Pain control with by mouth OxyIR 10 every 4 when necessary for moderate pain, Dilaudid IV 0.5 every 4-Toradol d/c 2/2 to AKI Pyridium 200 3 times  a day, continue IV fluids 75 cc per hour--started reg diet.  Would NOT add Belladonna over and above mybetriq for now unless urology states this is not duplication.  Adding flomax 0.4  AKI-continue fluid 75 cc per/hr-would recheck labs in the morning  Prostate cancer stage I adenocarcinoma, Gleason 8, PSA 7.6-radiation therapy on hold. Urology to comment on further planning. Continue Flomax 0.4 for emptying of the bladder  Rheumatoid arthritis-holding MTX in the setting of active infection    Discussed with family POC Change to Ip Expect will need 2-3 days for resolution  Verneita Griffes, MD  [facility] Pager [phone] 12/07/2016, 7:29 AM    LOS: 0 days

## 2016-12-07 NOTE — H&P (Signed)
TRH H&P   Patient Demographics:    Charles Frost, is a 71 y.o. male  MRN: [id number]   DOB - 1946/04/11  Admit Date - 12/07/2016  Outpatient Primary MD for the patient is No PCP Per Patient  Regino Schultze [facility])  Referring MD/NP/PA:   Dr. Claudine Mouton  Outpatient Specialists:  Dr. Alexis Frock  (urology)  Patient coming from: home  No chief complaint on file. penile pain    HPI:    Charles Frost  is a 71 y.o. male, w hx of prostate cancer, hypertension, apparently c/o burning with urination.  x1 week.  Slight blood in urine.  Denies fever, chills, n/v, flank pain. The pain became more severe tonight and therefore presented to ED.   In ED, CT scan abd/ pelvis: => multiple bilateral nonobstructing renal stones, circumferential bladder wall thickening  Pt has tried advil without relief.  ED contacted urology and requested medical admission w urology consultation.      Review of systems:    In addition to the HPI above,  No Fever-chills, No Headache, No changes with Vision or hearing, No problems swallowing food or Liquids, No Chest pain, Cough or Shortness of Breath, No Abdominal pain, No Nausea or Vommitting, Bowel movements are regular, No Blood in stool. + dysuria No new skin rashes or bruises, No new joints pains-aches,  No new weakness, tingling, numbness in any extremity, No recent weight gain or loss, No polyuria, polydypsia or polyphagia, No significant Mental Stressors.  A full 10 point Review of Systems was done, except as stated above, all other Review of Systems were negative.   With Past History of the following :    Past Medical History:  Diagnosis Date  . GERD (gastroesophageal reflux disease)   . Hypercholesterolemia   . Hypertension   . Prostate cancer ([place])   . Rheumatoid arthritis ([place])    RA      Past Surgical History:    Procedure Laterality Date  . cysto uretero remove stone  2009  . cystoscopy insert stent  2009  . PROSTATE BIOPSY    . ulnar nerve release Right 2008      Social History:     Social History  Substance Use Topics  . Smoking status: Former Smoker    Packs/day: 0.25    Years: 30.00    Types: Cigarettes    Quit date: 05/09/2000  . Smokeless tobacco: Never Used  . Alcohol use No     Lives - at home  Mobility - walks by self     Family History :     Family History  Problem Relation Age of Onset  . Cancer Maternal Aunt   . Cancer Paternal Grandfather   . Dementia Mother       Home Medications:   Prior to Admission medications   Medication Sig Start Date End Date Taking?  Authorizing Provider  doxycycline (VIBRA-TABS) 100 MG tablet Take 1 tablet (100 mg total) by mouth 2 (two) times daily. 12/02/16  Yes [name], PA-C  folic acid (FOLVITE) 1 MG tablet Take 1 mg by mouth daily.   Yes Historical Provider, MD  phenazopyridine (PYRIDIUM) 200 MG tablet Take 1 tablet (200 mg total) by mouth 3 (three) times daily. 12/04/16  Yes Varney Biles, MD  predniSONE (DELTASONE) 5 MG tablet Take 5 mg by mouth daily as needed (pain). PRN    Yes Historical Provider, MD  HYDROcodone-acetaminophen (NORCO/VICODIN) 5-325 MG tablet Take 1 tablet by mouth every 6 (six) hours as needed. Patient not taking: Reported on 12/07/2016 12/04/16   Varney Biles, MD  methotrexate 50 MG/2ML injection Inject 25 mg/m2 into the vein once a week.  11/26/16   Historical Provider, MD  tamsulosin (FLOMAX) 0.4 MG CAPS capsule Take 1 capsule (0.4 mg total) by mouth daily after supper. Patient not taking: Reported on 12/07/2016 11/28/16   [name], PA-C     Allergies:    No Known Allergies   Physical Exam:   Vitals  Blood pressure 111/76, pulse 88, temperature 98.2 F (36.8 C), temperature source Oral, resp. rate 18, height 5\' 7"  (1.702 m), weight 76.7 kg (169 lb), SpO2 97 %.   1. General  lying in  bed in NAD,   2. Normal affect and insight, Not Suicidal or Homicidal, Awake Alert, Oriented X 3.  3. No F.N deficits, ALL C.Nerves Intact, Strength 5/5 all 4 extremities, Sensation intact all 4 extremities, Plantars down going.  4. Ears and Eyes appear Normal, Conjunctivae clear, PERRLA. Moist Oral Mucosa.  5. Supple Neck, No JVD, No cervical lymphadenopathy appriciated, No Carotid Bruits.  6. Symmetrical Chest wall movement, Good air movement bilaterally, CTAB.  7. RRR, No Gallops, Rubs or Murmurs, No Parasternal Heave.  8. Positive Bowel Sounds, Abdomen Soft, No tenderness, No organomegaly appriciated,No rebound -guarding or rigidity.  9.  No Cyanosis, Normal Skin Turgor, No Skin Rash or Bruise.  10. Good muscle tone,  joints appear normal , no effusions, Normal ROM.  11. No Palpable Lymph Nodes in Neck or Axillae     Data Review:    CBC  Recent Labs Lab 12/04/16 1814 12/04/16 2200 12/07/16 0331 12/07/16 0346  WBC 5.9  --  6.0  --   HGB 11.7* 12.2* 11.1* 12.2*  HCT 35.8* 36.0* 33.9* 36.0*  PLT 277  --  305  --   MCV 84.4  --  82.9  --   MCH 27.6  --  27.1  --   MCHC 32.7  --  32.7  --   RDW 16.5*  --  16.0*  --   LYMPHSABS 0.6*  --  0.6*  --   MONOABS 0.4  --  0.4  --   EOSABS 0.3  --  0.4  --   BASOSABS 0.0  --  0.0  --    ------------------------------------------------------------------------------------------------------------------  Chemistries   Recent Labs Lab 12/04/16 2200 12/07/16 0346  NA 142 140  K 3.9 3.8  CL 107 109  GLUCOSE 100* 113*  BUN 22* 21*  CREATININE 0.90 1.20   ------------------------------------------------------------------------------------------------------------------ estimated creatinine clearance is 55.1 mL/min (by C-G formula based on SCr of 1.2 mg/dL). ------------------------------------------------------------------------------------------------------------------ No results for input(s): TSH, T4TOTAL, T3FREE,  THYROIDAB in the last 72 hours.  Invalid input(s): FREET3  Coagulation profile No results for input(s): INR, PROTIME in the last 168 hours. ------------------------------------------------------------------------------------------------------------------- No results for input(s): DDIMER in the  last 72 hours. -------------------------------------------------------------------------------------------------------------------  Cardiac Enzymes No results for input(s): CKMB, TROPONINI, MYOGLOBIN in the last 168 hours.  Invalid input(s): CK ------------------------------------------------------------------------------------------------------------------ No results found for: BNP   ---------------------------------------------------------------------------------------------------------------  Urinalysis    Component Value Date/Time   COLORURINE YELLOW 01/21/2013 2202   APPEARANCEUR CLEAR 01/21/2013 2202   LABSPEC 1.020 11/28/2016 1618   PHURINE 5.0 11/28/2016 1618   PHURINE 6.0 01/21/2013 2202   GLUCOSEU Negative 11/28/2016 1618   HGBUR Negative 11/28/2016 1618   HGBUR LARGE (A) 01/21/2013 2202   BILIRUBINUR Negative 11/28/2016 1618   KETONESUR Negative 11/28/2016 1618   [place] 01/21/2013 2202   PROTEINUR 30 11/28/2016 1618   PROTEINUR NEGATIVE 01/21/2013 2202   UROBILINOGEN 0.2 11/28/2016 1618   NITRITE Negative 11/28/2016 1618   NITRITE NEGATIVE 01/21/2013 2202   LEUKOCYTESUR Small 11/28/2016 1618    ----------------------------------------------------------------------------------------------------------------   Imaging Results:    No results found.   Assessment & Plan:    Principal Problem:   Bladder pain Active Problems:   Anemia   Hyperglycemia   Hypertension    1. Penile /Bladder pain ua c and s pending Foley in place Urology consulted by ED,  Will be by in AM, appreciate input  2. Anemia Check cbc in am to ensure stability  3.  Hyperglycemia Check hga1c  4. Hypertension Continue current medication.    DVT Prophylaxis SCDs   AM Labs Ordered, also please review Full Orders  Family Communication: Admission, patients condition and plan of care including tests being ordered have been discussed with the patient  who indicate understanding and agree with the plan and Code Status.  Code Status FULL CODE  Likely DC to  home  Condition GUARDED    Consults called:  Urology by ED  Admission status: observation  Time spent in minutes : 45 minutes   Jani Gravel M.D on 12/07/2016 at 4:55 AM  Between 7am to 7pm - Pager - [phone] After 7pm go to [email or web address] - password [facility]  [facility] - Office  [phone]

## 2016-12-07 NOTE — ED Provider Notes (Signed)
[facility] DEPT Provider Note   By signing my name below, I, Charles Frost, attest that this documentation has been prepared under the direction and in the presence of Charles Balls, MD. Electronically Signed: Bea Frost, ED Scribe. 12/07/16. 3:41 AM.    History   Chief Complaint No chief complaint on file.  HPI  HPI Comments:  Charles Frost is a 71 y.o. male with PMHx of prostate cancer, brought in by EMS, who presents to the Emergency Department complaining of worsening low abdominal pain. He reports associated dysuria and hematuria. He states he was diagnosed with a UTI three days ago and prescribed Bactrim and Pyridium which he reports taking as directed. He denies modifying factors. He denies fever, chills, nausea, vomiting. He states he has missed his past two radiation treatments and is not on chemotherapy.   Past Medical History:  Diagnosis Date  . GERD (gastroesophageal reflux disease)   . Hypercholesterolemia   . Hypertension   . Prostate cancer ([place])   . Rheumatoid arthritis ([place])    RA    Patient Active Problem List   Diagnosis Date Noted  . Malignant neoplasm of prostate ([place]) 08/28/2016    Past Surgical History:  Procedure Laterality Date  . cysto uretero remove stone  2009  . cystoscopy insert stent  2009  . PROSTATE BIOPSY    . ulnar nerve release Right 2008       Home Medications    Prior to Admission medications   Medication Sig Start Date End Date Taking? Authorizing Provider  doxycycline (VIBRA-TABS) 100 MG tablet Take 1 tablet (100 mg total) by mouth 2 (two) times daily. 12/02/16   [name], PA-C  folic acid (FOLVITE) 1 MG tablet Take 1 mg by mouth daily.    Historical Provider, MD  HYDROcodone-acetaminophen (NORCO/VICODIN) 5-325 MG tablet Take 1 tablet by mouth every 6 (six) hours as needed. 12/04/16   Varney Biles, MD  Meth-Hyo-M Barnett Hatter Phos-Ph Sal (URIBEL) 118 MG CAPS Take 1 capsule (118 mg total) by mouth every 6 (six) hours  as needed. 12/04/16   [name], PA-C  methotrexate 50 MG/2ML injection Inject 25 mg/m2 into the vein once a week.  11/26/16   Historical Provider, MD  naproxen (NAPROSYN) 500 MG tablet Take 500 mg by mouth 2 (two) times daily as needed (arthritis pain).    Historical Provider, MD  phenazopyridine (PYRIDIUM) 200 MG tablet Take 1 tablet (200 mg total) by mouth 3 (three) times daily. 12/04/16   Varney Biles, MD  Polyethyl Glycol-Propyl Glycol (SYSTANE OP) Place 1-2 drops into both eyes daily as needed (eye irritation).    Historical Provider, MD  predniSONE (DELTASONE) 5 MG tablet Take 5 mg by mouth daily as needed (pain). PRN     Historical Provider, MD  sulfamethoxazole-trimethoprim (BACTRIM DS,SEPTRA DS) 800-160 MG tablet Take 1 tablet by mouth 2 (two) times daily. Patient not taking: Reported on 12/04/2016 12/03/16   Charles Pita, MD  tamsulosin (FLOMAX) 0.4 MG CAPS capsule Take 1 capsule (0.4 mg total) by mouth daily after supper. 11/28/16   [name], PA-C    Family History Family History  Problem Relation Age of Onset  . Cancer Maternal Aunt   . Cancer Paternal Grandfather     Social History Social History  Substance Use Topics  . Smoking status: Former Smoker    Packs/day: 0.25    Years: 30.00    Types: Cigarettes    Quit date: 05/09/2000  . Smokeless tobacco: Never Used  .  Alcohol use No     Allergies   Patient has no known allergies.   Review of Systems Review of Systems A complete 10 system review of systems was obtained and all systems are negative except as noted in the HPI and PMH.    Physical Exam Updated Vital Signs BP (!) 147/87 (BP Location: Left Arm)   Pulse 86   Temp 98.2 F (36.8 C) (Oral)   Resp 20   SpO2 97%   Physical Exam  Constitutional: He is oriented to person, place, and time. Vital signs are normal. He appears well-developed and well-nourished.  Non-toxic appearance. He does not appear ill. No distress.  HENT:  Head: Normocephalic  and atraumatic.  Nose: Nose normal.  Mouth/Throat: Oropharynx is clear and moist. No oropharyngeal exudate.  Eyes: Conjunctivae and EOM are normal. Pupils are equal, round, and reactive to light. No scleral icterus.  Neck: Normal range of motion. Neck supple. No tracheal deviation, no edema, no erythema and normal range of motion present. No thyroid mass and no thyromegaly present.  Cardiovascular: Normal rate, regular rhythm, S1 normal, S2 normal, normal heart sounds, intact distal pulses and normal pulses.  Exam reveals no gallop and no friction rub.   No murmur heard. Pulmonary/Chest: Effort normal and breath sounds normal. No respiratory distress. He has no wheezes. He has no rhonchi. He has no rales.  Abdominal: Soft. Normal appearance and bowel sounds are normal. He exhibits distension. He exhibits no ascites and no mass. There is no hepatosplenomegaly. There is tenderness in the suprapubic area. There is no rebound, no guarding and no CVA tenderness.  Genitourinary:  Genitourinary Comments: Small amounts of urine dribbling from penile meatus.  Musculoskeletal: Normal range of motion. He exhibits no edema or tenderness.  Lymphadenopathy:    He has no cervical adenopathy.  Neurological: He is alert and oriented to person, place, and time. He has normal strength. No cranial nerve deficit or sensory deficit.  Skin: Skin is warm, dry and intact. No petechiae and no rash noted. He is not diaphoretic. No erythema. No pallor.  Nursing note and vitals reviewed.    ED Treatments / Results  DIAGNOSTIC STUDIES: Oxygen Saturation is 97% on RA, normal by my interpretation.   COORDINATION OF CARE: 12:55 AM- Will order bladder scan. Pt verbalizes understanding and agrees to plan.  Medications  lidocaine (XYLOCAINE) 2 % jelly 1 application (1 application Urethral Given 12/07/16 0157)  oxyCODONE (Oxy IR/ROXICODONE) immediate release tablet 10 mg (10 mg Oral Given 12/07/16 0206)  HYDROmorphone  (DILAUDID) injection 1 mg (1 mg Intravenous Given 12/07/16 0231)    Labs (all labs ordered are listed, but only abnormal results are displayed) Labs Reviewed  URINE CULTURE  CBC WITH DIFFERENTIAL/PLATELET  URINALYSIS, ROUTINE W REFLEX MICROSCOPIC  I-STAT CHEM 8, ED    EKG  EKG Interpretation None       Radiology No results found.  Procedures Procedures (including critical care time)  Medications Ordered in ED Medications  lidocaine (XYLOCAINE) 2 % jelly 1 application (1 application Urethral Given 12/07/16 0157)  oxyCODONE (Oxy IR/ROXICODONE) immediate release tablet 10 mg (10 mg Oral Given 12/07/16 0206)  HYDROmorphone (DILAUDID) injection 1 mg (1 mg Intravenous Given 12/07/16 0231)     Initial Impression / Assessment and Plan / ED Course  I have reviewed the triage vital signs and the nursing notes.  Pertinent labs & imaging results that were available during my care of the patient were reviewed by me and considered in my  medical decision making (see chart for details).     Patient presents to the ED for pain in his penis.  He also has suprapubic pain. Given his prostate cancer, he likely has obstruction and retention. Will obtain bladder scan and likely DC with foley catheter. Patient already on appropriate abx for his infection.   Foley catheter did not relieve his pain. He was given dilaudid x 2 and toradol.  I can not control his pain I have concern for radiation cystitis. I spoke with Dr. Karsten Ro who recs to admit for pain control and he will consult. He does not believe this is caused by radiation.  He is on abx for his UTI, it is sensitive to bactrim.  Patient will be admitted to hospitalist.   I personally performed the services described in this documentation, which was scribed in my presence. The recorded information has been reviewed and is accurate.     Final Clinical Impressions(s) / ED Diagnoses   Final diagnoses:  Radiation cystitis    New  Prescriptions New Prescriptions   No medications on file     Charles Balls, MD 12/07/16 [phone]

## 2016-12-07 NOTE — ED Triage Notes (Signed)
Patient is complaining lower abdominal pain and urinary tract infection that was diagnosed a week ago. Per EMS patient did not get antibiotics filled.

## 2016-12-07 NOTE — Progress Notes (Signed)
Notified urology MD about patient's bladder spasms becoming more frequent, about every 15 mins. He ordered ditropan PO and also advised to discontinue foley should spasms worsen through out night. Also advised patient may not be able to urinate post foley dc, if so then may re-insert foley catheter.

## 2016-12-07 NOTE — ED Notes (Signed)
Called unit to give report. Nurse will call back.

## 2016-12-08 ENCOUNTER — Telehealth: Payer: Self-pay | Admitting: Radiation Oncology

## 2016-12-08 ENCOUNTER — Ambulatory Visit: Payer: Medicare HMO

## 2016-12-08 DIAGNOSIS — Y842 Radiological procedure and radiotherapy as the cause of abnormal reaction of the patient, or of later complication, without mention of misadventure at the time of the procedure: Secondary | ICD-10-CM | POA: Diagnosis present

## 2016-12-08 DIAGNOSIS — Z1623 Resistance to quinolones and fluoroquinolones: Secondary | ICD-10-CM | POA: Diagnosis present

## 2016-12-08 DIAGNOSIS — Z923 Personal history of irradiation: Secondary | ICD-10-CM | POA: Diagnosis not present

## 2016-12-08 DIAGNOSIS — I10 Essential (primary) hypertension: Secondary | ICD-10-CM | POA: Diagnosis present

## 2016-12-08 DIAGNOSIS — N390 Urinary tract infection, site not specified: Secondary | ICD-10-CM | POA: Diagnosis present

## 2016-12-08 DIAGNOSIS — D649 Anemia, unspecified: Secondary | ICD-10-CM | POA: Diagnosis present

## 2016-12-08 DIAGNOSIS — C61 Malignant neoplasm of prostate: Secondary | ICD-10-CM | POA: Diagnosis present

## 2016-12-08 DIAGNOSIS — Z791 Long term (current) use of non-steroidal anti-inflammatories (NSAID): Secondary | ICD-10-CM | POA: Diagnosis not present

## 2016-12-08 DIAGNOSIS — N200 Calculus of kidney: Secondary | ICD-10-CM | POA: Diagnosis present

## 2016-12-08 DIAGNOSIS — E869 Volume depletion, unspecified: Secondary | ICD-10-CM | POA: Diagnosis present

## 2016-12-08 DIAGNOSIS — Z79899 Other long term (current) drug therapy: Secondary | ICD-10-CM | POA: Diagnosis not present

## 2016-12-08 DIAGNOSIS — N179 Acute kidney failure, unspecified: Secondary | ICD-10-CM | POA: Diagnosis present

## 2016-12-08 DIAGNOSIS — Z7952 Long term (current) use of systemic steroids: Secondary | ICD-10-CM | POA: Diagnosis not present

## 2016-12-08 DIAGNOSIS — Z79891 Long term (current) use of opiate analgesic: Secondary | ICD-10-CM | POA: Diagnosis not present

## 2016-12-08 DIAGNOSIS — K219 Gastro-esophageal reflux disease without esophagitis: Secondary | ICD-10-CM | POA: Diagnosis present

## 2016-12-08 DIAGNOSIS — M069 Rheumatoid arthritis, unspecified: Secondary | ICD-10-CM | POA: Diagnosis present

## 2016-12-08 DIAGNOSIS — R3989 Other symptoms and signs involving the genitourinary system: Secondary | ICD-10-CM | POA: Diagnosis not present

## 2016-12-08 DIAGNOSIS — N3040 Irradiation cystitis without hematuria: Secondary | ICD-10-CM | POA: Diagnosis present

## 2016-12-08 DIAGNOSIS — B9620 Unspecified Escherichia coli [E. coli] as the cause of diseases classified elsewhere: Secondary | ICD-10-CM | POA: Diagnosis present

## 2016-12-08 DIAGNOSIS — E7800 Pure hypercholesterolemia, unspecified: Secondary | ICD-10-CM | POA: Diagnosis present

## 2016-12-08 DIAGNOSIS — R739 Hyperglycemia, unspecified: Secondary | ICD-10-CM | POA: Diagnosis present

## 2016-12-08 DIAGNOSIS — Z87891 Personal history of nicotine dependence: Secondary | ICD-10-CM | POA: Diagnosis not present

## 2016-12-08 LAB — CBC WITH DIFFERENTIAL/PLATELET
Basophils Absolute: 0 10*3/uL (ref 0.0–0.1)
Basophils Relative: 0 %
EOS ABS: 0.4 10*3/uL (ref 0.0–0.7)
Eosinophils Relative: 8 %
HCT: 35.2 % — ABNORMAL LOW (ref 39.0–52.0)
HEMOGLOBIN: 11.4 g/dL — AB (ref 13.0–17.0)
LYMPHS ABS: 0.6 10*3/uL — AB (ref 0.7–4.0)
LYMPHS PCT: 11 %
MCH: 27.3 pg (ref 26.0–34.0)
MCHC: 32.4 g/dL (ref 30.0–36.0)
MCV: 84.4 fL (ref 78.0–100.0)
MONOS PCT: 8 %
Monocytes Absolute: 0.5 10*3/uL (ref 0.1–1.0)
Neutro Abs: 4.2 10*3/uL (ref 1.7–7.7)
Neutrophils Relative %: 73 %
Platelets: 284 10*3/uL (ref 150–400)
RBC: 4.17 MIL/uL — ABNORMAL LOW (ref 4.22–5.81)
RDW: 16 % — ABNORMAL HIGH (ref 11.5–15.5)
WBC: 5.8 10*3/uL (ref 4.0–10.5)

## 2016-12-08 LAB — URINE CULTURE: CULTURE: NO GROWTH

## 2016-12-08 LAB — BASIC METABOLIC PANEL
Anion gap: 6 (ref 5–15)
BUN: 18 mg/dL (ref 6–20)
CHLORIDE: 109 mmol/L (ref 101–111)
CO2: 24 mmol/L (ref 22–32)
CREATININE: 1.02 mg/dL (ref 0.61–1.24)
Calcium: 9.5 mg/dL (ref 8.9–10.3)
GFR calc Af Amer: >60 mL/min (ref >60)
GFR calc non Af Amer: >60 mL/min (ref >60)
Glucose, Bld: 101 mg/dL — ABNORMAL HIGH (ref 65–99)
Potassium: 3.9 mmol/L (ref 3.5–5.1)
Sodium: 139 mmol/L (ref 135–145)

## 2016-12-08 MED ORDER — ENSURE ENLIVE PO LIQD
237.0000 mL | Freq: Two times a day (BID) | ORAL | Status: DC
  Administered 2016-12-08 – 2016-12-09 (×2): 237 mL via ORAL

## 2016-12-08 NOTE — Progress Notes (Signed)
Urology Progress Note    Subjective: Prostate Cancer: radiation cystitis. recenrt UTI, E. Coli, Rx doxycycline.     No acute urologic events overnight. Pt c/o bladder spasm q 15 minutes.  Ambulation:   negative Flatus:    positive Bowel movement  negative  Pain: some relief  Objective:  Blood pressure 118/74, pulse 94, temperature 99.1 F (37.3 C), temperature source Oral, resp. rate 16, height 5\' 7"  (1.702 m), weight 78 kg (171 lb 15.3 oz), SpO2 94 %.  Physical Exam:  General:  No acute distress, awake  Genitourinary:  Normal s-p exam Foley: pyridium-co,ored urine    I/O last 3 completed shifts: In: 998.8 [P.O.:480; I.V.:518.8] Out: 1975 [[id number]]  Recent Labs     12/07/16  0703  12/08/16  0424  HGB  10.4*  11.4*  WBC  6.1  5.8  PLT  285  284    Recent Labs     12/07/16  0703  12/08/16  0424  NA  137  139  K  4.1  3.9  CL  107  109  CO2  23  24  BUN  23*  18  CREATININE  1.45*  1.02  CALCIUM  9.3  9.5  GFRNONAA  48*  >60  GFRAA  56*  >60     No results for input(s): INR, APTT in the last 72 hours.  Invalid input(s): PT   Invalid input(s): ABG  Assessment/Plan:  Catheter removed. Will give pt chance to void on hid own, now that UTI has been treated.  Continue any current medications. Have attemted to notify rad Rx. No answer.

## 2016-12-08 NOTE — Progress Notes (Signed)
Charles Frost RKY:[id number] DOB: May 01, 1946 DOA: 12/07/2016 PCP: No PCP Per Patient  Brief narrative:  71 y/o ? Stage TIc adenoCa prostate cancer 12/2015-managed by Dr. 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 Seen by emergency room 3/29 felt stable and sent home only to return 4/1 with increasing pain Secondary to severe pain patient was given morphine as well as Flomax Eventually sent to emergency department 3/29  Found to have BUN/creatinine 21/1. 2 Volume depletion White count 6.1, lately it to 85, hemoglobin 10.4 down from 11-12 range  Past medical history-As per Problem list Chart reviewed as below-   Consultants:  Urology  Procedures:  None yet  Antibiotics:  Doxycycline 4/1   Subjective   Fair Some improvement but urgency and still persists-using towel to ensure no accident    Objective    Objective: Vitals:   12/07/16 1406 12/07/16 1735 12/08/16 0050 12/08/16 0529  BP: 120/67 133/87 126/67 118/74  Pulse: 81 86 99 94  Resp: 18 16 16 16   Temp: 98.7 F (37.1 C) 98.2 F (36.8 C) 99 F (37.2 C) 99.1 F (37.3 C)  TempSrc: Oral Oral Oral Oral  SpO2: 94% 96% 96% 94%  Weight:    78 kg (171 lb 15.3 oz)  Height:        Intake/Output Summary (Last 24 hours) at 12/08/16 1232 Last data filed at 12/08/16 0945  Gross per 24 hour  Intake          1358.75 ml  Output             1975 ml  Net          -616.25 ml     Exam:  General: alert No pallor no ict abd soft nt nd no rebound--rectal deferred--slight lower quad tenderness Chest clear   Data Reviewed: Basic Metabolic Panel:  Recent Labs Lab 12/04/16 2200 12/07/16 0346 12/07/16 0703 12/08/16 0424  NA 142 140 137 139  K 3.9 3.8 4.1 3.9  CL 107 109 107 109  CO2  --   --  23 24  GLUCOSE 100* 113* 108* 101*  BUN 22* 21* 23* 18  CREATININE 0.90 1.20 1.45* 1.02  CALCIUM  --   --  9.3 9.5   Liver Function Tests:  Recent Labs Lab 12/07/16 0703  AST 14*  ALT 18  ALKPHOS 63  BILITOT 0.7  PROT 7.6  ALBUMIN 3.3*   No results for input(s): LIPASE, AMYLASE in the last 168 hours. No results for input(s): AMMONIA in the last 168 hours. CBC:  Recent Labs Lab 12/04/16 1814 12/04/16 2200 12/07/16 0331 12/07/16 0346 12/07/16 0703 12/08/16 0424  WBC 5.9  --  6.0  --  6.1 5.8  NEUTROABS 4.7  --  4.7  --   --  4.2  HGB 11.7* 12.2* 11.1* 12.2* 10.4* 11.4*  HCT 35.8* 36.0* 33.9* 36.0* 31.8* 35.2*  MCV 84.4  --  82.9  --  83.0 84.4  PLT 277  --  305  --  285 284   Cardiac Enzymes:  Recent Labs Lab 12/07/16 0703 12/07/16 1236 12/07/16 1905  TROPONINI <0.03 <0.03 <0.03   BNP: Invalid input(s): POCBNP CBG: No results for input(s): GLUCAP in the last 168 hours.  Recent Results (from the past 240 hour(s))  Urine culture     Status: Abnormal   Collection Time: 11/28/16  4:18 PM  Result Value Ref Range Status   Urine Culture, Routine Final report (A)  Final   Urine Culture result 1 Escherichia coli (A)  Final    Comment: 50,000-100,000 colony forming units per mL   ANTIMICROBIAL SUSCEPTIBILITY Comment  Final    Comment:       ** S = Susceptible; I = Intermediate; R = Resistant **                    P = Positive; N = Negative             MICS are expressed in micrograms per mL    Antibiotic                 RSLT#1    RSLT#2    RSLT#3    RSLT#4 Amoxicillin/Clavulanic Acid    S Ampicillin                     R Cefazolin                       R Cefepime                       R Ceftriaxone                    R Cefuroxime                     R Cephalothin                    R Ciprofloxacin                  R Ertapenem                      S Gentamicin                     S Imipenem                       S Levofloxacin                   R Nitrofurantoin                 I Piperacillin                   R Tetracycline                   S Tobramycin                     S Trimethoprim/Sulfa             S   Urine culture     Status: None   Collection Time: 12/07/16  4:19 AM  Result Value Ref Range Status   Specimen Description URINE, CATHETERIZED  Final   Special Requests NONE  Final   Culture   Final    NO GROWTH Performed at [facility]  [facility], [place] [address] [place], [place] [address]    Report Status 12/08/2016 FINAL  Final     Studies:              All Imaging reviewed and is as per above notation   Scheduled Meds: . doxycycline  100 mg Oral BID  . feeding supplement (ENSURE ENLIVE)  237 mL Oral BID BM  . folic acid  1 mg Oral Daily  . mirabegron ER  50 mg Oral Daily  . oxybutynin  5 mg Oral TID  . phenazopyridine  200 mg Oral TID  . tamsulosin  0.4 mg Oral Daily   Continuous Infusions:    Assessment/Plan:   Escherichia coli cystitis in the setting of recent radiation therapy for prostate cancer-patient's Escherichia coli is susceptible to doxycycline 100 twice a day. We will continue same. Pain control with by mouth OxyIR 10 every 4 when necessary for moderate pain, Dilaudid IV 0.5 every 4-Toradol d/c 2/2 to AKI--- Pyridium 200 3 times a day, continue IV fluids 75 cc per hour--started reg diet.  Urology added Belladonna 1 suppository q6prn. Continue mybetriq 50 od, ditropan 5 tid,  flomax 0.4  AKI-continue fluid 75 cc per/hr-resolved-keep on fluids for now and d/c on d/c  Prostate cancer stage I adenocarcinoma, Gleason 8, PSA 7.6-radiation therapy on hold. Urology to comment on further  planning. Continue Flomax 0.4 for emptying of the bladder  Rheumatoid arthritis-holding MTX in the setting of active infection    Discussed with family POC Change to Ip Discharge when pain controllable on oral meds  Verneita Griffes, MD  [facility] Pager [phone] 12/08/2016, 12:32 PM    LOS: 0 days

## 2016-12-08 NOTE — Progress Notes (Signed)
Initial Nutrition Assessment  DOCUMENTATION CODES:   Non-severe (moderate) malnutrition in context of chronic illness  INTERVENTION:   Provide Ensure Enlive po BID, each supplement provides 350 kcal and 20 grams of protein Encourage PO intake RD to continue to monitor  NUTRITION DIAGNOSIS:   Malnutrition (moderate) related to chronic illness, cancer and cancer related treatments as evidenced by percent weight loss, mild depletion of muscle mass, mild depletion of body fat.  GOAL:   Patient will meet greater than or equal to 90% of their needs  MONITOR:   PO intake, Supplement acceptance, Labs, Weight trends, I & O's  REASON FOR ASSESSMENT:   Malnutrition Screening Tool    ASSESSMENT:   71 y.o. male, w hx of prostate cancer, hypertension, apparently c/o burning with urination.  x1 week.  Slight blood in urine.  Denies fever, chills, n/v, flank pain. The pain became more severe tonight and therefore presented to ED.   Patient in room with son at bedside. Pt states he is eating well. Had bacon and eggs this morning for breakfast. PO intakes have ranged from 75-100%. Pt states despite eating well he is still losing weight. Currently receiving radiation treatments.  UBW is 190 lb per patient. He has lost 19 lbs since November 2017 (10% wt loss x 6 months, significant for time frame). Pt states he feels weaker and has lost muscle tone. States his clothes are too big right now. He used to body build. Nutrition-Focused physical exam completed. Findings are mild fat depletion, mild muscle depletion, and no edema.   Patient agreeable to receiving Ensure supplements, will order. Labs reviewed. Medications: Folic acid tablet daily  Diet Order:  Diet regular Room service appropriate? Yes; Fluid consistency: Thin  Skin:  Reviewed, no issues  Last BM:  4/1  Height:   Ht Readings from Last 1 Encounters:  12/07/16 5\' 7"  (1.702 m)    Weight:   Wt Readings from Last 1 Encounters:   12/08/16 171 lb 15.3 oz (78 kg)    Ideal Body Weight:  67.3 kg  BMI:  Body mass index is 26.93 kg/m.  Estimated Nutritional Needs:   Kcal:  0131-4388  Protein:  110-120g  Fluid:  2.3L/day  EDUCATION NEEDS:   No education needs identified at this time  Clayton Bibles, MS, RD, LDN Pager: [phone] After Hours Pager: [phone]

## 2016-12-08 NOTE — Telephone Encounter (Signed)
Phoned patient's home to access status. No answer. Realized patient was in house. Called mobile phone. Patient reports he was evaluated by Dr. Gaynelle Arabian this morning but, has had very little improvement in symptoms since admission. Patient reports he "isn't up for treatment today but, will try tomorrow." Informed treatment machine and Shona Simpson of findings.

## 2016-12-09 ENCOUNTER — Ambulatory Visit: Payer: Medicare HMO

## 2016-12-09 LAB — HEMOGLOBIN A1C
Hgb A1c MFr Bld: 6.3 % — ABNORMAL HIGH (ref 4.8–5.6)
Mean Plasma Glucose: 134 mg/dL

## 2016-12-09 MED ORDER — IBUPROFEN 800 MG PO TABS
800.0000 mg | ORAL_TABLET | Freq: Once | ORAL | Status: AC
  Administered 2016-12-09: 800 mg via ORAL
  Filled 2016-12-09: qty 1

## 2016-12-09 MED ORDER — MIRABEGRON ER 50 MG PO TB24: 50.0000 mg | ORAL_TABLET | Freq: Every day | ORAL | 0 refills | Status: DC

## 2016-12-09 MED ORDER — OXYBUTYNIN CHLORIDE 5 MG PO TABS: 5.0000 mg | ORAL_TABLET | Freq: Three times a day (TID) | ORAL | 0 refills | Status: DC

## 2016-12-09 MED ORDER — URO-MP 118 MG PO CAPS: 1.0000 | ORAL_CAPSULE | Freq: Three times a day (TID) | ORAL | Status: DC

## 2016-12-09 MED ORDER — BELLADONNA ALKALOIDS-OPIUM 16.2-60 MG RE SUPP: 1.0000 | Freq: Four times a day (QID) | RECTAL | 0 refills | Status: DC | PRN

## 2016-12-09 MED ORDER — PHENAZOPYRIDINE HCL 200 MG PO TABS: 200.0000 mg | ORAL_TABLET | Freq: Three times a day (TID) | ORAL | 0 refills | Status: DC

## 2016-12-09 MED ORDER — HYDROCODONE-ACETAMINOPHEN 5-325 MG PO TABS: 1.0000 | ORAL_TABLET | Freq: Four times a day (QID) | ORAL | 0 refills | Status: DC | PRN

## 2016-12-09 MED ORDER — TAMSULOSIN HCL 0.4 MG PO CAPS: 0.8000 mg | ORAL_CAPSULE | Freq: Every day | ORAL | 2 refills | Status: DC

## 2016-12-09 NOTE — Progress Notes (Signed)
Assessment:1. Acute urinary retention 2ndary Radfiation cystitis:  Spasms gone after foley removal. Voiding in small amounts ( per overnight nurse). Pt on tamsulosin. May need double dose. CT shows thickened prostate/bladder wall junction, probably 2ndary Radiation. 2. CT :   tiny bilateral Ca++ ( normal ureters, no hydro).   3. AKI resolved.   Plan: 1. [place] for d/c from Urology stand-pt.           2. Pt knows to RTC after completion of radiation therapy           3. Pt to return to radiation therapy for completion of treatment.            4. Motrin as needed for rheumatoid arthritis pain,but stay off methotrexate per Radiation therapy.           5. OK for pyridium 200mg  TID  for burning and levsin sl BID  for spasm ( or combination meds Urispaz BID or Urelle BID) at                      Discharge.           6. Pain med per IM. Caution for constipation. Discussed with pt  and his wife.     Subjective: Patient reports voiding after foley removed. No more spasms.   Objective: Vital signs in last 24 hours: Temp:  [97.8 F (36.6 C)-98.5 F (36.9 C)] 97.8 F (36.6 C) (04/03 0733) Pulse Rate:  [94-104] 94 (04/03 0733) Resp:  [16] 16 (04/03 0733) BP: (117-123)/(61-67) 117/67 (04/03 0733) SpO2:  [95 %-96 %] 95 % (04/03 0733)A  Intake/Output from previous day: 04/02 0701 - 04/03 0700 In: 1080 [P.O.:1080] Out: 100 [Urine:100] Intake/Output this shift: No intake/output data recorded.  Past Medical History:  Diagnosis Date  . GERD (gastroesophageal reflux disease)   . Hypercholesterolemia   . Hypertension   . Prostate cancer ([place])   . Rheumatoid arthritis ([place])    RA    Physical Exam:  Lungs - Normal respiratory effort, chest expands symmetrically.  Abdomen - Soft, non-tender & non-distended.  Lab Results:  Recent Labs  12/07/16 0331 12/07/16 0346 12/07/16 0703 12/08/16 0424  WBC 6.0  --  6.1 5.8  HGB 11.1* 12.2* 10.4* 11.4*  HCT 33.9* 36.0* 31.8* 35.2*    BMET  Recent Labs  12/07/16 0703 12/08/16 0424  NA 137 139  K 4.1 3.9  CL 107 109  CO2 23 24  GLUCOSE 108* 101*  BUN 23* 18  CREATININE 1.45* 1.02  CALCIUM 9.3 9.5   No results for input(s): LABURIN in the last 72 hours. Results for orders placed or performed during the hospital encounter of 12/07/16  Urine culture     Status: None   Collection Time: 12/07/16  4:19 AM  Result Value Ref Range Status   Specimen Description URINE, CATHETERIZED  Final   Special Requests NONE  Final   Culture   Final    NO GROWTH Performed at [facility], [address], [place], [place] [address]    Report Status 12/08/2016 FINAL  Final    Studies/Results: Study Result   CLINICAL DATA:  Hematuria.  Bladder spasms.  EXAM: CT ABDOMEN AND PELVIS WITHOUT CONTRAST  TECHNIQUE: Multidetector CT imaging of the abdomen and pelvis was performed following the standard protocol without IV contrast.  COMPARISON:  08/08/2016  FINDINGS: Lower chest:  Unremarkable.  Hepatobiliary: Tiny hypoattenuating lesions again identified inferior right liver, stable. There is no  evidence for gallstones, gallbladder wall thickening, or pericholecystic fluid. No intrahepatic or extrahepatic biliary dilation.  Pancreas: Scattered parenchymal calcification in the pancreatic parenchyma suggests possible chronic pancreatitis.  Spleen: No splenomegaly. No focal mass lesion.  Adrenals/Urinary Tract: No adrenal nodule or mass. Patient had multiple bilateral nonobstructing renal stones on the previous study which have decreased in the interval. Several very tiny nonobstructing stones are seen in the right kidney (best seen on coronal images 90, 89, 81 and 76). 1-2 mm nonobstructing stone identified upper pole left kidney on coronal images. No ureteral or bladder stones. Circumferential bladder wall thickening is most pronounced posteriorly adjacent to the prostate gland.  Stomach/Bowel:  Stomach is nondistended. No gastric wall thickening. No evidence of outlet obstruction. Duodenum is normally positioned as is the ligament of Treitz. No small bowel wall thickening. No small bowel dilatation. The terminal ileum is normal. The appendix is normal. Diverticular changes are noted in the left colon without evidence of diverticulitis.  Vascular/Lymphatic: There is abdominal aortic atherosclerosis without aneurysm. There is no gastrohepatic or hepatoduodenal ligament lymphadenopathy. No intraperitoneal or retroperitoneal lymphadenopathy. No pelvic sidewall lymphadenopathy.  Reproductive: Brachytherapy seeds noted in the prostate gland.  Other: No intraperitoneal free fluid.  Musculoskeletal: Left groin hernia contains only fat. Bone windows reveal no worrisome lytic or sclerotic osseous lesions.  IMPRESSION: 1. Multiple bilateral tiny nonobstructing renal stones although stone burden in the kidneys is decreased in the interval. No evidence for ureteral or bladder stones. 2. Circumferential bladder wall thickening, most pronounced posteriorly adjacent to the prostate gland. Given the history of pelvic radiation, this may be related to treatment. Bladder infection could have a similar appearance. 3.  Abdominal Aortic Atherosclerois (ICD10-170.0)   Electronically Signed   By: Misty Stanley M.D.   On: 12/04/2016 19:49       Jahir Halt I Damere Brandenburg 12/09/2016, 7:54 AM

## 2016-12-09 NOTE — Progress Notes (Signed)
Legrand Rams, RN on [address] phoned this RN to report that the patient is refusing radiation therapy again today. Informed Shona Simpson, PA-C and L3 of this finding. Will check in with patient tomorrow.

## 2016-12-09 NOTE — Discharge Summary (Signed)
Physician Discharge Summary  Charles Frost EGB:[id number] DOB: 06/26/1946 DOA: 12/07/2016  PCP: No PCP Per Patient  Admit date: 12/07/2016 Discharge date: 12/09/2016  Time spent: 45 minutes  Recommendations for Outpatient Follow-up:  1. Needs follow-up as an outpatient and will need a basic metabolic panel and a CBC in about one week 2. Needs to have medications downward titrated once acute strangury and spasms stop  Discharge Diagnoses:  Principal Problem:   Bladder pain Active Problems:   Anemia   Hyperglycemia   Hypertension   UTI (urinary tract infection)   Discharge Condition: Improved  Diet recommendation: Heart healthy  Filed Weights   12/07/16 0232 12/07/16 0645 12/08/16 0529  Weight: 76.7 kg (169 lb) 77.7 kg (171 lb 4.8 oz) 78 kg (171 lb 15.3 oz)    History of present illness:  71 y/o ? Stage TIc adenoCa prostate cancer 12/2015-managed by Dr. 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 Seen by emergency room 3/29 felt stable and sent home only to return 4/1 with increasing pain Secondary to severe pain patient was given morphine as well as Flomax Eventually sent to emergency department 3/29  Found to have BUN/creatinine 21/1. 2 Volume depletion White count 6.1, lately it to 69, hemoglobin 10.4 down from 11-12 range  Hospital Course:      Escherichia coli cystitis in the setting of recent radiation  therapy for prostate cancer-patient's Escherichia coli is susceptible to doxycycline 100 twice a day--stop date for 618.  IV fluids 75 cc per hour were saline locked --patient is to hold the home dosage of that was on 5 mg for now as well as methotrexate  Spasms-Pain control with by mouth OxyIR 10 every 4 when necessary for moderate pain and the patient was prescribed this on discharge --- Pyridium 200 3 times a day.  Urology added Belladonna 1 suppository q6prn as an OP placed on Uro-MP-advised the patient to use either this or the suppository. Continue mybetriq 50 od, ditropan 5 tid,  flomax 0.4 increased to 0.8 mg, started Mybetrig ER 50 qd,              AKI-continue fluid 75 cc per/hr-resolved-keep on fluids for now and d/c on d/c             Prostate cancer stage I adenocarcinoma, Gleason 8, PSA 7.6-radiation therapy on hold. Urology to comment on further planning. Continue Flomax 0.4 for emptying of the bladder             Rheumatoid arthritis-holding MTX in the setting of active infection   Consultations:  Urology  Discharge Exam: Vitals:   12/08/16 2043 12/09/16 0733  BP: 123/61 117/67  Pulse: (!) 104 94  Resp: 16 16  Temp: 98.5 F (36.9 C) 97.8 F (36.6 C)    General: Looks much more  comfortable pleasant oriented in no distress Cardiovascular: S1 S2 no murmur rub or gallop Respiratory: Clinically clear no added sound  Discharge Instructions   Discharge Instructions    Diet - low sodium heart healthy    Complete by:  As directed    Discharge instructions    Complete by:  As directed    You have been prescribed multiple medications -I would recommend that you use the Pyridium for burning as well as take the Flomax at 0.8 mg dosing  You can use the  URO-MP for spasm . The cautious if you feel dizzy and cut back use of this if needed and use the suppositories as an alternative  Continue the Ditropan 5 mg  You have received antibiotic and would suggest that you stop the  doxycycline on 12/13/15 as this would've been 7 days for complicated infection  Would recommend that you take hydrocodone as needed and I prescribed MiraLAX as well  You should follow-up for your regular radiation therapy as possible  Would recommend that he take ibuprofen over-the-counter 2 or 3 x 200  mg tablets for now and hold off on taking prednisone for the time being but as her symptoms improved in the next 5 or 6 days you can resume this   Increase activity slowly    Complete by:  As directed      Current Discharge Medication List    START taking these medications   Details  Meth-Hyo-M Bl-Na Phos-Ph Sal (URO-MP) 118 MG CAPS Take 1 tablet by mouth 3 (three) times daily.    mirabegron ER (MYRBETRIQ) 50 MG TB24 tablet Take 1 tablet (50 mg total) by mouth daily. Qty: 30 tablet, Refills: 0    opium-belladonna (B&O SUPPRETTES) 16.2-60 MG suppository Place 1 suppository rectally every 6 (six) hours as needed for bladder spasms. Qty: 60 suppository, Refills: 0    oxybutynin (DITROPAN) 5 MG tablet Take 1 tablet (5 mg total) by mouth 3 (three) times daily. Qty: 90 tablet, Refills: 0      CONTINUE these medications which have CHANGED   Details  HYDROcodone-acetaminophen (NORCO/VICODIN) 5-325 MG tablet Take 1 tablet by mouth every 6 (six) hours as needed for moderate pain. Qty: 30 tablet, Refills: 0    phenazopyridine (PYRIDIUM) 200 MG tablet Take 1 tablet (200 mg total) by mouth 3 (three) times daily. Qty: 10 tablet, Refills: 0    tamsulosin (FLOMAX) 0.4 MG CAPS capsule Take 2 capsules (0.8 mg total) by mouth daily after supper. Qty: 30 capsule, Refills: 2      CONTINUE these medications which have NOT CHANGED   Details  doxycycline (VIBRA-TABS) 100 MG tablet Take 1 tablet (100 mg total) by mouth 2 (two) times daily. Qty: 20 tablet, Refills: 0   Associated Diagnoses: Dysuria; Urinary tract infection without hematuria, site unspecified    folic acid (FOLVITE) 1 MG tablet Take 1  mg by mouth daily.      STOP taking these medications     predniSONE (DELTASONE) 5 MG tablet      methotrexate 50 MG/2ML injection      Polyethyl Glycol-Propyl Glycol (SYSTANE OP)        No Known Allergies    The results of significant diagnostics from this hospitalization (including imaging, microbiology, ancillary and laboratory) are listed below for reference.    Significant Diagnostic Studies: Ct Renal Stone Study  Result Date: 12/04/2016 CLINICAL DATA:  Hematuria.  Bladder spasms. EXAM: CT ABDOMEN AND PELVIS WITHOUT CONTRAST TECHNIQUE: Multidetector CT imaging  of the abdomen and pelvis was performed following the standard protocol without IV contrast. COMPARISON:  08/08/2016 FINDINGS: Lower chest:  Unremarkable. Hepatobiliary: Tiny hypoattenuating lesions again identified inferior right liver, stable. There is no evidence for gallstones, gallbladder wall thickening, or pericholecystic fluid. No intrahepatic or extrahepatic biliary dilation. Pancreas: Scattered parenchymal calcification in the pancreatic parenchyma suggests possible chronic pancreatitis. Spleen: No splenomegaly. No focal mass lesion. Adrenals/Urinary Tract: No adrenal nodule or mass. Patient had multiple bilateral nonobstructing renal stones on the previous study which have decreased in the interval. Several very tiny nonobstructing stones are seen in the right kidney (best seen on coronal images 90, 89, 81 and 76). 1-2 mm nonobstructing stone identified upper pole left kidney on coronal images. No ureteral or bladder stones. Circumferential bladder wall thickening is most pronounced posteriorly adjacent to the prostate gland. Stomach/Bowel: Stomach is nondistended. No gastric wall thickening. No evidence of outlet obstruction. Duodenum is normally positioned as is the ligament of Treitz. No small bowel wall thickening. No small bowel dilatation. The terminal ileum is normal. The appendix is normal. Diverticular changes are  noted in the left colon without evidence of diverticulitis. Vascular/Lymphatic: There is abdominal aortic atherosclerosis without aneurysm. There is no gastrohepatic or hepatoduodenal ligament lymphadenopathy. No intraperitoneal or retroperitoneal lymphadenopathy. No pelvic sidewall lymphadenopathy. Reproductive: Brachytherapy seeds noted in the prostate gland. Other: No intraperitoneal free fluid. Musculoskeletal: Left groin hernia contains only fat. Bone windows reveal no worrisome lytic or sclerotic osseous lesions. IMPRESSION: 1. Multiple bilateral tiny nonobstructing renal stones although stone burden in the kidneys is decreased in the interval. No evidence for ureteral or bladder stones. 2. Circumferential bladder wall thickening, most pronounced posteriorly adjacent to the prostate gland. Given the history of pelvic radiation, this may be related to treatment. Bladder infection could have a similar appearance. 3.  Abdominal Aortic Atherosclerois (ICD10-170.0) Electronically Signed   By: Misty Stanley M.D.   On: 12/04/2016 19:49    Microbiology: Recent Results (from the past 240 hour(s))  Urine culture     Status: None   Collection Time: 12/07/16  4:19 AM  Result Value Ref Range Status   Specimen Description URINE, CATHETERIZED  Final   Special Requests NONE  Final   Culture   Final    NO GROWTH Performed at [facility], [address] [place], [place] [address]    Report Status 12/08/2016 FINAL  Final     Labs: Basic Metabolic Panel:  Recent Labs Lab 12/04/16 2200 12/07/16 0346 12/07/16 0703 12/08/16 0424  NA 142 140 137 139  K 3.9 3.8 4.1 3.9  CL 107 109 107 109  CO2  --   --  23 24  GLUCOSE 100* 113* 108* 101*  BUN 22* 21* 23* 18  CREATININE 0.90 1.20 1.45* 1.02  CALCIUM  --   --  9.3 9.5   Liver Function Tests:  Recent Labs Lab 12/07/16 0703  AST 14*  ALT 18  ALKPHOS 63  BILITOT 0.7  PROT 7.6  ALBUMIN 3.3*   No results for input(s): LIPASE, AMYLASE in  the last 168 hours. No results for input(s): AMMONIA in the last 168 hours. CBC:  Recent Labs Lab 12/04/16 1814 12/04/16 2200 12/07/16 0331 12/07/16 0346 12/07/16 0703 12/08/16 0424  WBC 5.9  --  6.0  --  6.1 5.8  NEUTROABS 4.7  --  4.7  --   --  4.2  HGB 11.7* 12.2* 11.1* 12.2* 10.4* 11.4*  HCT 35.8* 36.0* 33.9* 36.0* 31.8*  35.2*  MCV 84.4  --  82.9  --  83.0 84.4  PLT 277  --  305  --  285 284   Cardiac Enzymes:  Recent Labs Lab 12/07/16 0703 12/07/16 1236 12/07/16 1905  TROPONINI <0.03 <0.03 <0.03   BNP: BNP (last 3 results) No results for input(s): BNP in the last 8760 hours.  ProBNP (last 3 results) No results for input(s): PROBNP in the last 8760 hours.  CBG: No results for input(s): GLUCAP in the last 168 hours.     SignedNita Sells MD   [facility] 12/09/2016, 10:27 AM

## 2016-12-10 ENCOUNTER — Ambulatory Visit: Payer: Medicare HMO

## 2016-12-10 ENCOUNTER — Telehealth: Payer: Self-pay | Admitting: Radiation Oncology

## 2016-12-10 NOTE — Telephone Encounter (Signed)
I spoke with the patient to see if he would come tomorrow for his treatment. I encouraged him that the outcome of his treatment may be less if he doesn't continue on the current schedule. However explained that he had to make the decision regarding this. He's taking antibiotics and medication for his LUTS. He will follow up with Dr. Gaynelle Arabian as well if his symptoms from his UTI worsen.

## 2016-12-11 ENCOUNTER — Other Ambulatory Visit: Payer: Self-pay | Admitting: *Deleted

## 2016-12-11 ENCOUNTER — Ambulatory Visit: Payer: Medicare HMO

## 2016-12-11 ENCOUNTER — Inpatient Hospital Stay
Admission: RE | Admit: 2016-12-11 | Discharge: 2016-12-11 | Disposition: A | Discharge status: Home / Self Care | Payer: Medicare HMO | Source: Ambulatory Visit | Attending: Radiation Oncology | Admitting: Radiation Oncology

## 2016-12-11 ENCOUNTER — Ambulatory Visit
Admission: RE | Admit: 2016-12-11 | Discharge: 2016-12-11 | Disposition: A | Discharge status: Home / Self Care | Payer: Medicare HMO | Source: Ambulatory Visit | Attending: Radiation Oncology | Admitting: Radiation Oncology

## 2016-12-11 VITALS — BP 107/70 | HR 120 | Temp 98.9°F | Resp 18 | Wt 176.2 lb

## 2016-12-11 DIAGNOSIS — M069 Rheumatoid arthritis, unspecified: Secondary | ICD-10-CM | POA: Diagnosis not present

## 2016-12-11 DIAGNOSIS — E7800 Pure hypercholesterolemia, unspecified: Secondary | ICD-10-CM | POA: Diagnosis not present

## 2016-12-11 DIAGNOSIS — R300 Dysuria: Secondary | ICD-10-CM | POA: Diagnosis not present

## 2016-12-11 DIAGNOSIS — N390 Urinary tract infection, site not specified: Secondary | ICD-10-CM | POA: Diagnosis not present

## 2016-12-11 DIAGNOSIS — C61 Malignant neoplasm of prostate: Secondary | ICD-10-CM | POA: Diagnosis not present

## 2016-12-11 DIAGNOSIS — K219 Gastro-esophageal reflux disease without esophagitis: Secondary | ICD-10-CM | POA: Diagnosis not present

## 2016-12-11 DIAGNOSIS — Z87891 Personal history of nicotine dependence: Secondary | ICD-10-CM | POA: Diagnosis not present

## 2016-12-11 DIAGNOSIS — Z510 Encounter for antineoplastic radiation therapy: Secondary | ICD-10-CM | POA: Diagnosis not present

## 2016-12-11 DIAGNOSIS — I10 Essential (primary) hypertension: Secondary | ICD-10-CM | POA: Diagnosis not present

## 2016-12-11 DIAGNOSIS — Z9889 Other specified postprocedural states: Secondary | ICD-10-CM | POA: Diagnosis not present

## 2016-12-11 DIAGNOSIS — Z87442 Personal history of urinary calculi: Secondary | ICD-10-CM | POA: Diagnosis not present

## 2016-12-11 DIAGNOSIS — R1030 Lower abdominal pain, unspecified: Secondary | ICD-10-CM | POA: Diagnosis not present

## 2016-12-11 DIAGNOSIS — Z809 Family history of malignant neoplasm, unspecified: Secondary | ICD-10-CM | POA: Diagnosis not present

## 2016-12-11 LAB — URINALYSIS, MICROSCOPIC - CHCC
Bilirubin (Urine): NEGATIVE
Glucose: NEGATIVE mg/dL
KETONES: NEGATIVE mg/dL
Nitrite: NEGATIVE
Protein: 30 mg/dL
SPECIFIC GRAVITY, URINE: 1.02 (ref 1.003–1.035)
Urobilinogen, UR: 0.2 mg/dL (ref 0.2–1)
pH: 6 (ref 4.6–8.0)

## 2016-12-12 ENCOUNTER — Other Ambulatory Visit: Payer: Self-pay | Admitting: Radiation Oncology

## 2016-12-12 ENCOUNTER — Ambulatory Visit: Payer: Medicare HMO

## 2016-12-12 ENCOUNTER — Inpatient Hospital Stay
Admission: RE | Admit: 2016-12-12 | Discharge: 2016-12-12 | Disposition: A | Discharge status: Home / Self Care | Payer: Self-pay | Source: Ambulatory Visit | Attending: Radiation Oncology | Admitting: Radiation Oncology

## 2016-12-12 ENCOUNTER — Ambulatory Visit
Admission: RE | Admit: 2016-12-12 | Discharge: 2016-12-12 | Disposition: A | Discharge status: Home / Self Care | Payer: Medicare HMO | Source: Ambulatory Visit | Attending: Radiation Oncology | Admitting: Radiation Oncology

## 2016-12-12 ENCOUNTER — Other Ambulatory Visit: Payer: Medicare HMO

## 2016-12-12 ENCOUNTER — Ambulatory Visit: Payer: Medicare HMO | Admitting: Radiation Oncology

## 2016-12-12 VITALS — BP 138/94 | HR 97 | Wt 173.2 lb

## 2016-12-12 DIAGNOSIS — N390 Urinary tract infection, site not specified: Secondary | ICD-10-CM

## 2016-12-12 DIAGNOSIS — C61 Malignant neoplasm of prostate: Secondary | ICD-10-CM | POA: Diagnosis not present

## 2016-12-12 DIAGNOSIS — R3989 Other symptoms and signs involving the genitourinary system: Secondary | ICD-10-CM

## 2016-12-12 DIAGNOSIS — R1030 Lower abdominal pain, unspecified: Secondary | ICD-10-CM | POA: Diagnosis not present

## 2016-12-12 DIAGNOSIS — K219 Gastro-esophageal reflux disease without esophagitis: Secondary | ICD-10-CM | POA: Diagnosis not present

## 2016-12-12 DIAGNOSIS — M069 Rheumatoid arthritis, unspecified: Secondary | ICD-10-CM | POA: Diagnosis not present

## 2016-12-12 DIAGNOSIS — Z510 Encounter for antineoplastic radiation therapy: Secondary | ICD-10-CM | POA: Diagnosis not present

## 2016-12-12 DIAGNOSIS — I10 Essential (primary) hypertension: Secondary | ICD-10-CM | POA: Diagnosis not present

## 2016-12-12 DIAGNOSIS — R319 Hematuria, unspecified: Secondary | ICD-10-CM

## 2016-12-12 DIAGNOSIS — E7800 Pure hypercholesterolemia, unspecified: Secondary | ICD-10-CM | POA: Diagnosis not present

## 2016-12-12 DIAGNOSIS — R300 Dysuria: Secondary | ICD-10-CM | POA: Diagnosis not present

## 2016-12-12 LAB — URINE CULTURE

## 2016-12-12 MED ORDER — AMOXICILLIN-POT CLAVULANATE 875-125 MG PO TABS: 1.0000 | ORAL_TABLET | Freq: Two times a day (BID) | ORAL | 0 refills | Status: DC

## 2016-12-12 NOTE — Progress Notes (Signed)
  Radiation Oncology         [phone] ________________________________  Name: Charles Frost MRN: [id number]  Date: 12/12/2016  DOB: 11/28/1945    Weekly Radiation Therapy Management    ICD-9-CM ICD-10-CM   1. Malignant neoplasm of prostate (HCC) 185 C61      Current Dose: 45 Gy     Planned Dose:  75 Gy  Narrative . . . . . . . . The patient presents for routine under treatment assessment.                                 Patient presents with wife and all medications to review with Shona Simpson, PA-C. Together they plan to decide what medication to continue and which ones to stop. Patient reports confusion surrounding medications. Reports mild hematuria and rectal bleeding. He reports some nausea with treatment though it is manageable. He reports dysuria Which is quite bothersome to him.                                  Set-up films were reviewed.                                 The chart was checked. Physical Findings. . .  weight is 173 lb 3.2 oz (78.6 kg). His blood pressure is 138/94 (abnormal) and his pulse is 97. . Weight essentially stable. Lungs are clear to auscultation bilaterally. Heart has regular rate and rhythm. Abdomen soft, non-tender, normal bowel sounds.  Impression . . . . . . . The patient is tolerating radiation. Plan . . . . . . . . . . . . Continue treatment as planned. Prescribed Augmentin today for UTI.  ________________________________   Blair Promise, PhD, MD  This document serves as a record of services personally performed by Gery Pray, MD and Shona Simpson, PA-C. It was created on their behalf by Arlyce Harman, a trained medical scribe. The creation of this record is based on the scribe's personal observations and the provider's statements to them. This document has been checked and approved by the attending provider.

## 2016-12-12 NOTE — Progress Notes (Signed)
Patient presents with wife and all medications to review with Shona Simpson, PA-C. Together they plan to decide what medication to continue and which ones to stop. Patient reports confusion surrounding medications.

## 2016-12-13 NOTE — Progress Notes (Signed)
The patient came around to the clinic today confused about his medication he's supposed to be taking for his recent UTI. He did not know what medications he had or was taking. I reviewed his medications and we discussed continuation of oxybutinin and hydrocodone for dysuria. He and his wife will bring in all the pill bottles tomorrow when he sees Dr. Sondra Come and I will review this with him.

## 2016-12-15 ENCOUNTER — Ambulatory Visit
Admission: RE | Admit: 2016-12-15 | Discharge: 2016-12-15 | Disposition: A | Discharge status: Home / Self Care | Payer: Medicare HMO | Source: Ambulatory Visit | Attending: Radiation Oncology | Admitting: Radiation Oncology

## 2016-12-15 ENCOUNTER — Ambulatory Visit: Payer: Medicare HMO

## 2016-12-15 ENCOUNTER — Telehealth: Payer: Self-pay | Admitting: Radiation Oncology

## 2016-12-15 DIAGNOSIS — M069 Rheumatoid arthritis, unspecified: Secondary | ICD-10-CM | POA: Diagnosis not present

## 2016-12-15 DIAGNOSIS — R300 Dysuria: Secondary | ICD-10-CM | POA: Diagnosis not present

## 2016-12-15 DIAGNOSIS — I10 Essential (primary) hypertension: Secondary | ICD-10-CM | POA: Diagnosis not present

## 2016-12-15 DIAGNOSIS — N390 Urinary tract infection, site not specified: Secondary | ICD-10-CM | POA: Diagnosis not present

## 2016-12-15 DIAGNOSIS — R1030 Lower abdominal pain, unspecified: Secondary | ICD-10-CM | POA: Diagnosis not present

## 2016-12-15 DIAGNOSIS — Z510 Encounter for antineoplastic radiation therapy: Secondary | ICD-10-CM | POA: Diagnosis not present

## 2016-12-15 DIAGNOSIS — C61 Malignant neoplasm of prostate: Secondary | ICD-10-CM | POA: Diagnosis not present

## 2016-12-15 DIAGNOSIS — K219 Gastro-esophageal reflux disease without esophagitis: Secondary | ICD-10-CM | POA: Diagnosis not present

## 2016-12-15 DIAGNOSIS — E7800 Pure hypercholesterolemia, unspecified: Secondary | ICD-10-CM | POA: Diagnosis not present

## 2016-12-15 NOTE — Telephone Encounter (Signed)
-----   Message from Hayden Pedro, PA-C sent at 12/13/2016  6:45 AM EDT ----- Will you let him know that despite the culture being negative, i'd recommend he complete the amoxicillin antibiotic I gave him? ----- Message ----- From: Interface, Lab In Three Zero One Sent: 12/11/2016   4:56 PM To: Hayden Pedro, PA-C

## 2016-12-15 NOTE — Telephone Encounter (Signed)
Phoned patient to assess status. Patient reports he has four days left of antibiotics. Stressed importance of taking antibiotics as directed until completion. Patient explained that overall he feels slightly better. Reports dysuria continues. Confirmed patient is taking flomax, ditropan and augmentin as directed.

## 2016-12-16 ENCOUNTER — Ambulatory Visit
Admission: RE | Admit: 2016-12-16 | Discharge: 2016-12-16 | Disposition: A | Discharge status: Home / Self Care | Payer: Medicare HMO | Source: Ambulatory Visit | Attending: Radiation Oncology | Admitting: Radiation Oncology

## 2016-12-16 DIAGNOSIS — R300 Dysuria: Secondary | ICD-10-CM | POA: Diagnosis not present

## 2016-12-16 DIAGNOSIS — M069 Rheumatoid arthritis, unspecified: Secondary | ICD-10-CM | POA: Diagnosis not present

## 2016-12-16 DIAGNOSIS — C61 Malignant neoplasm of prostate: Secondary | ICD-10-CM | POA: Diagnosis not present

## 2016-12-16 DIAGNOSIS — R1030 Lower abdominal pain, unspecified: Secondary | ICD-10-CM | POA: Diagnosis not present

## 2016-12-16 DIAGNOSIS — Z510 Encounter for antineoplastic radiation therapy: Secondary | ICD-10-CM | POA: Diagnosis not present

## 2016-12-16 DIAGNOSIS — K219 Gastro-esophageal reflux disease without esophagitis: Secondary | ICD-10-CM | POA: Diagnosis not present

## 2016-12-16 DIAGNOSIS — E7800 Pure hypercholesterolemia, unspecified: Secondary | ICD-10-CM | POA: Diagnosis not present

## 2016-12-16 DIAGNOSIS — I10 Essential (primary) hypertension: Secondary | ICD-10-CM | POA: Diagnosis not present

## 2016-12-16 DIAGNOSIS — N390 Urinary tract infection, site not specified: Secondary | ICD-10-CM | POA: Diagnosis not present

## 2016-12-17 ENCOUNTER — Ambulatory Visit
Admission: RE | Admit: 2016-12-17 | Discharge: 2016-12-17 | Disposition: A | Discharge status: Home / Self Care | Payer: Medicare HMO | Source: Ambulatory Visit | Attending: Radiation Oncology | Admitting: Radiation Oncology

## 2016-12-17 DIAGNOSIS — N390 Urinary tract infection, site not specified: Secondary | ICD-10-CM | POA: Diagnosis not present

## 2016-12-17 DIAGNOSIS — E7800 Pure hypercholesterolemia, unspecified: Secondary | ICD-10-CM | POA: Diagnosis not present

## 2016-12-17 DIAGNOSIS — R300 Dysuria: Secondary | ICD-10-CM | POA: Diagnosis not present

## 2016-12-17 DIAGNOSIS — I10 Essential (primary) hypertension: Secondary | ICD-10-CM | POA: Diagnosis not present

## 2016-12-17 DIAGNOSIS — Z510 Encounter for antineoplastic radiation therapy: Secondary | ICD-10-CM | POA: Diagnosis not present

## 2016-12-17 DIAGNOSIS — M069 Rheumatoid arthritis, unspecified: Secondary | ICD-10-CM | POA: Diagnosis not present

## 2016-12-17 DIAGNOSIS — R1030 Lower abdominal pain, unspecified: Secondary | ICD-10-CM | POA: Diagnosis not present

## 2016-12-17 DIAGNOSIS — C61 Malignant neoplasm of prostate: Secondary | ICD-10-CM | POA: Diagnosis not present

## 2016-12-17 DIAGNOSIS — K219 Gastro-esophageal reflux disease without esophagitis: Secondary | ICD-10-CM | POA: Diagnosis not present

## 2016-12-18 ENCOUNTER — Ambulatory Visit
Admission: RE | Admit: 2016-12-18 | Discharge: 2016-12-18 | Disposition: A | Discharge status: Home / Self Care | Payer: Medicare HMO | Source: Ambulatory Visit | Attending: Radiation Oncology | Admitting: Radiation Oncology

## 2016-12-18 DIAGNOSIS — M069 Rheumatoid arthritis, unspecified: Secondary | ICD-10-CM | POA: Diagnosis not present

## 2016-12-18 DIAGNOSIS — C61 Malignant neoplasm of prostate: Secondary | ICD-10-CM | POA: Diagnosis not present

## 2016-12-18 DIAGNOSIS — Z510 Encounter for antineoplastic radiation therapy: Secondary | ICD-10-CM | POA: Diagnosis not present

## 2016-12-18 DIAGNOSIS — E7800 Pure hypercholesterolemia, unspecified: Secondary | ICD-10-CM | POA: Diagnosis not present

## 2016-12-18 DIAGNOSIS — I10 Essential (primary) hypertension: Secondary | ICD-10-CM | POA: Diagnosis not present

## 2016-12-18 DIAGNOSIS — N390 Urinary tract infection, site not specified: Secondary | ICD-10-CM | POA: Diagnosis not present

## 2016-12-18 DIAGNOSIS — R300 Dysuria: Secondary | ICD-10-CM | POA: Diagnosis not present

## 2016-12-18 DIAGNOSIS — R1030 Lower abdominal pain, unspecified: Secondary | ICD-10-CM | POA: Diagnosis not present

## 2016-12-18 DIAGNOSIS — K219 Gastro-esophageal reflux disease without esophagitis: Secondary | ICD-10-CM | POA: Diagnosis not present

## 2016-12-19 ENCOUNTER — Ambulatory Visit
Admission: RE | Admit: 2016-12-19 | Discharge: 2016-12-19 | Disposition: A | Discharge status: Home / Self Care | Payer: Medicare HMO | Source: Ambulatory Visit | Attending: Radiation Oncology | Admitting: Radiation Oncology

## 2016-12-19 DIAGNOSIS — E7800 Pure hypercholesterolemia, unspecified: Secondary | ICD-10-CM | POA: Diagnosis not present

## 2016-12-19 DIAGNOSIS — R1030 Lower abdominal pain, unspecified: Secondary | ICD-10-CM | POA: Diagnosis not present

## 2016-12-19 DIAGNOSIS — R300 Dysuria: Secondary | ICD-10-CM | POA: Diagnosis not present

## 2016-12-19 DIAGNOSIS — K219 Gastro-esophageal reflux disease without esophagitis: Secondary | ICD-10-CM | POA: Diagnosis not present

## 2016-12-19 DIAGNOSIS — Z510 Encounter for antineoplastic radiation therapy: Secondary | ICD-10-CM | POA: Diagnosis not present

## 2016-12-19 DIAGNOSIS — M069 Rheumatoid arthritis, unspecified: Secondary | ICD-10-CM | POA: Diagnosis not present

## 2016-12-19 DIAGNOSIS — N390 Urinary tract infection, site not specified: Secondary | ICD-10-CM | POA: Diagnosis not present

## 2016-12-19 DIAGNOSIS — C61 Malignant neoplasm of prostate: Secondary | ICD-10-CM | POA: Diagnosis not present

## 2016-12-19 DIAGNOSIS — I10 Essential (primary) hypertension: Secondary | ICD-10-CM | POA: Diagnosis not present

## 2016-12-22 ENCOUNTER — Other Ambulatory Visit: Payer: Self-pay | Admitting: Radiation Oncology

## 2016-12-22 ENCOUNTER — Ambulatory Visit
Admission: RE | Admit: 2016-12-22 | Discharge: 2016-12-22 | Disposition: A | Discharge status: Home / Self Care | Payer: Medicare HMO | Source: Ambulatory Visit | Attending: Radiation Oncology | Admitting: Radiation Oncology

## 2016-12-22 ENCOUNTER — Other Ambulatory Visit: Payer: Self-pay | Admitting: *Deleted

## 2016-12-22 DIAGNOSIS — C61 Malignant neoplasm of prostate: Secondary | ICD-10-CM | POA: Diagnosis not present

## 2016-12-22 DIAGNOSIS — M069 Rheumatoid arthritis, unspecified: Secondary | ICD-10-CM | POA: Diagnosis not present

## 2016-12-22 DIAGNOSIS — Z510 Encounter for antineoplastic radiation therapy: Secondary | ICD-10-CM | POA: Diagnosis not present

## 2016-12-22 DIAGNOSIS — R300 Dysuria: Secondary | ICD-10-CM | POA: Diagnosis not present

## 2016-12-22 DIAGNOSIS — K219 Gastro-esophageal reflux disease without esophagitis: Secondary | ICD-10-CM | POA: Diagnosis not present

## 2016-12-22 DIAGNOSIS — R1030 Lower abdominal pain, unspecified: Secondary | ICD-10-CM | POA: Diagnosis not present

## 2016-12-22 DIAGNOSIS — E7800 Pure hypercholesterolemia, unspecified: Secondary | ICD-10-CM | POA: Diagnosis not present

## 2016-12-22 DIAGNOSIS — I10 Essential (primary) hypertension: Secondary | ICD-10-CM | POA: Diagnosis not present

## 2016-12-22 DIAGNOSIS — N390 Urinary tract infection, site not specified: Secondary | ICD-10-CM | POA: Diagnosis not present

## 2016-12-22 LAB — URINALYSIS, MICROSCOPIC - CHCC
BILIRUBIN (URINE): NEGATIVE
Glucose: NEGATIVE mg/dL
Ketones: NEGATIVE mg/dL
Leukocyte Esterase: NEGATIVE
Nitrite: NEGATIVE
PH: 6 (ref 4.6–8.0)
Protein: 100 mg/dL
SPECIFIC GRAVITY, URINE: 1.02 (ref 1.003–1.035)
Urobilinogen, UR: 0.2 mg/dL (ref 0.2–1)

## 2016-12-23 ENCOUNTER — Ambulatory Visit: Payer: Medicare HMO

## 2016-12-23 ENCOUNTER — Ambulatory Visit
Admission: RE | Admit: 2016-12-23 | Discharge: 2016-12-23 | Disposition: A | Discharge status: Home / Self Care | Payer: Medicare HMO | Source: Ambulatory Visit | Attending: Radiation Oncology | Admitting: Radiation Oncology

## 2016-12-23 DIAGNOSIS — Z510 Encounter for antineoplastic radiation therapy: Secondary | ICD-10-CM | POA: Diagnosis not present

## 2016-12-23 DIAGNOSIS — C61 Malignant neoplasm of prostate: Secondary | ICD-10-CM | POA: Diagnosis not present

## 2016-12-23 DIAGNOSIS — E7800 Pure hypercholesterolemia, unspecified: Secondary | ICD-10-CM | POA: Diagnosis not present

## 2016-12-23 DIAGNOSIS — I10 Essential (primary) hypertension: Secondary | ICD-10-CM | POA: Diagnosis not present

## 2016-12-23 DIAGNOSIS — K219 Gastro-esophageal reflux disease without esophagitis: Secondary | ICD-10-CM | POA: Diagnosis not present

## 2016-12-23 DIAGNOSIS — M069 Rheumatoid arthritis, unspecified: Secondary | ICD-10-CM | POA: Diagnosis not present

## 2016-12-23 DIAGNOSIS — N390 Urinary tract infection, site not specified: Secondary | ICD-10-CM | POA: Diagnosis not present

## 2016-12-23 DIAGNOSIS — R300 Dysuria: Secondary | ICD-10-CM | POA: Diagnosis not present

## 2016-12-23 DIAGNOSIS — R1030 Lower abdominal pain, unspecified: Secondary | ICD-10-CM | POA: Diagnosis not present

## 2016-12-23 LAB — URINE CULTURE: ORGANISM ID, BACTERIA: NO GROWTH

## 2016-12-24 ENCOUNTER — Ambulatory Visit: Payer: Medicare HMO

## 2016-12-24 ENCOUNTER — Telehealth: Payer: Self-pay | Admitting: Radiation Oncology

## 2016-12-24 ENCOUNTER — Ambulatory Visit
Admission: RE | Admit: 2016-12-24 | Discharge: 2016-12-24 | Disposition: A | Discharge status: Home / Self Care | Payer: Medicare HMO | Source: Ambulatory Visit | Attending: Radiation Oncology | Admitting: Radiation Oncology

## 2016-12-24 DIAGNOSIS — Z510 Encounter for antineoplastic radiation therapy: Secondary | ICD-10-CM | POA: Diagnosis not present

## 2016-12-24 DIAGNOSIS — N390 Urinary tract infection, site not specified: Secondary | ICD-10-CM | POA: Diagnosis not present

## 2016-12-24 DIAGNOSIS — R300 Dysuria: Secondary | ICD-10-CM | POA: Diagnosis not present

## 2016-12-24 DIAGNOSIS — E7800 Pure hypercholesterolemia, unspecified: Secondary | ICD-10-CM | POA: Diagnosis not present

## 2016-12-24 DIAGNOSIS — K219 Gastro-esophageal reflux disease without esophagitis: Secondary | ICD-10-CM | POA: Diagnosis not present

## 2016-12-24 DIAGNOSIS — M069 Rheumatoid arthritis, unspecified: Secondary | ICD-10-CM | POA: Diagnosis not present

## 2016-12-24 DIAGNOSIS — C61 Malignant neoplasm of prostate: Secondary | ICD-10-CM | POA: Diagnosis not present

## 2016-12-24 DIAGNOSIS — R1030 Lower abdominal pain, unspecified: Secondary | ICD-10-CM | POA: Diagnosis not present

## 2016-12-24 DIAGNOSIS — I10 Essential (primary) hypertension: Secondary | ICD-10-CM | POA: Diagnosis not present

## 2016-12-24 NOTE — Telephone Encounter (Signed)
Phoned patient's home. Informed him that his urine culture was negative for infection thus no antibiotics will be prescribed. Patient verbalized understanding and expressed appreciation for the call.

## 2016-12-24 NOTE — Telephone Encounter (Signed)
-----   Message from Hayden Pedro, PA-C sent at 12/24/2016  8:12 AM EDT ----- No role for abx at this point ----- Message ----- From: Interface, Lab In Three Zero One Sent: 12/22/2016   4:47 PM To: Hayden Pedro, PA-C

## 2016-12-25 ENCOUNTER — Ambulatory Visit
Admission: RE | Admit: 2016-12-25 | Discharge: 2016-12-25 | Disposition: A | Discharge status: Home / Self Care | Payer: Medicare HMO | Source: Ambulatory Visit | Attending: Radiation Oncology | Admitting: Radiation Oncology

## 2016-12-25 ENCOUNTER — Ambulatory Visit: Payer: Medicare HMO

## 2016-12-25 DIAGNOSIS — M069 Rheumatoid arthritis, unspecified: Secondary | ICD-10-CM | POA: Diagnosis not present

## 2016-12-25 DIAGNOSIS — I10 Essential (primary) hypertension: Secondary | ICD-10-CM | POA: Diagnosis not present

## 2016-12-25 DIAGNOSIS — N390 Urinary tract infection, site not specified: Secondary | ICD-10-CM | POA: Diagnosis not present

## 2016-12-25 DIAGNOSIS — R1030 Lower abdominal pain, unspecified: Secondary | ICD-10-CM | POA: Diagnosis not present

## 2016-12-25 DIAGNOSIS — R300 Dysuria: Secondary | ICD-10-CM | POA: Diagnosis not present

## 2016-12-25 DIAGNOSIS — Z510 Encounter for antineoplastic radiation therapy: Secondary | ICD-10-CM | POA: Diagnosis not present

## 2016-12-25 DIAGNOSIS — C61 Malignant neoplasm of prostate: Secondary | ICD-10-CM | POA: Diagnosis not present

## 2016-12-25 DIAGNOSIS — E7800 Pure hypercholesterolemia, unspecified: Secondary | ICD-10-CM | POA: Diagnosis not present

## 2016-12-25 DIAGNOSIS — K219 Gastro-esophageal reflux disease without esophagitis: Secondary | ICD-10-CM | POA: Diagnosis not present

## 2016-12-26 ENCOUNTER — Ambulatory Visit: Payer: Medicare HMO

## 2016-12-26 ENCOUNTER — Ambulatory Visit
Admission: RE | Admit: 2016-12-26 | Discharge: 2016-12-26 | Disposition: A | Discharge status: Home / Self Care | Payer: Medicare HMO | Source: Ambulatory Visit | Attending: Radiation Oncology | Admitting: Radiation Oncology

## 2016-12-26 DIAGNOSIS — Z510 Encounter for antineoplastic radiation therapy: Secondary | ICD-10-CM | POA: Diagnosis not present

## 2016-12-26 DIAGNOSIS — N390 Urinary tract infection, site not specified: Secondary | ICD-10-CM | POA: Diagnosis not present

## 2016-12-26 DIAGNOSIS — I10 Essential (primary) hypertension: Secondary | ICD-10-CM | POA: Diagnosis not present

## 2016-12-26 DIAGNOSIS — R1030 Lower abdominal pain, unspecified: Secondary | ICD-10-CM | POA: Diagnosis not present

## 2016-12-26 DIAGNOSIS — K219 Gastro-esophageal reflux disease without esophagitis: Secondary | ICD-10-CM | POA: Diagnosis not present

## 2016-12-26 DIAGNOSIS — C61 Malignant neoplasm of prostate: Secondary | ICD-10-CM | POA: Diagnosis not present

## 2016-12-26 DIAGNOSIS — E7800 Pure hypercholesterolemia, unspecified: Secondary | ICD-10-CM | POA: Diagnosis not present

## 2016-12-26 DIAGNOSIS — R300 Dysuria: Secondary | ICD-10-CM | POA: Diagnosis not present

## 2016-12-26 DIAGNOSIS — M069 Rheumatoid arthritis, unspecified: Secondary | ICD-10-CM | POA: Diagnosis not present

## 2016-12-29 ENCOUNTER — Ambulatory Visit: Payer: Medicare HMO

## 2016-12-29 ENCOUNTER — Ambulatory Visit
Admission: RE | Admit: 2016-12-29 | Discharge: 2016-12-29 | Disposition: A | Discharge status: Home / Self Care | Payer: Medicare HMO | Source: Ambulatory Visit | Attending: Radiation Oncology | Admitting: Radiation Oncology

## 2016-12-29 DIAGNOSIS — I10 Essential (primary) hypertension: Secondary | ICD-10-CM | POA: Diagnosis not present

## 2016-12-29 DIAGNOSIS — Z510 Encounter for antineoplastic radiation therapy: Secondary | ICD-10-CM | POA: Diagnosis not present

## 2016-12-29 DIAGNOSIS — K219 Gastro-esophageal reflux disease without esophagitis: Secondary | ICD-10-CM | POA: Diagnosis not present

## 2016-12-29 DIAGNOSIS — C61 Malignant neoplasm of prostate: Secondary | ICD-10-CM | POA: Diagnosis not present

## 2016-12-29 DIAGNOSIS — R1030 Lower abdominal pain, unspecified: Secondary | ICD-10-CM | POA: Diagnosis not present

## 2016-12-29 DIAGNOSIS — M069 Rheumatoid arthritis, unspecified: Secondary | ICD-10-CM | POA: Diagnosis not present

## 2016-12-29 DIAGNOSIS — R300 Dysuria: Secondary | ICD-10-CM | POA: Diagnosis not present

## 2016-12-29 DIAGNOSIS — E7800 Pure hypercholesterolemia, unspecified: Secondary | ICD-10-CM | POA: Diagnosis not present

## 2016-12-29 DIAGNOSIS — N390 Urinary tract infection, site not specified: Secondary | ICD-10-CM | POA: Diagnosis not present

## 2016-12-30 ENCOUNTER — Ambulatory Visit: Payer: Medicare HMO

## 2016-12-30 ENCOUNTER — Ambulatory Visit
Admission: RE | Admit: 2016-12-30 | Discharge: 2016-12-30 | Disposition: A | Discharge status: Home / Self Care | Payer: Medicare HMO | Source: Ambulatory Visit | Attending: Radiation Oncology | Admitting: Radiation Oncology

## 2016-12-30 DIAGNOSIS — N390 Urinary tract infection, site not specified: Secondary | ICD-10-CM | POA: Diagnosis not present

## 2016-12-30 DIAGNOSIS — E7800 Pure hypercholesterolemia, unspecified: Secondary | ICD-10-CM | POA: Diagnosis not present

## 2016-12-30 DIAGNOSIS — Z510 Encounter for antineoplastic radiation therapy: Secondary | ICD-10-CM | POA: Diagnosis not present

## 2016-12-30 DIAGNOSIS — M069 Rheumatoid arthritis, unspecified: Secondary | ICD-10-CM | POA: Diagnosis not present

## 2016-12-30 DIAGNOSIS — R1030 Lower abdominal pain, unspecified: Secondary | ICD-10-CM | POA: Diagnosis not present

## 2016-12-30 DIAGNOSIS — C61 Malignant neoplasm of prostate: Secondary | ICD-10-CM | POA: Diagnosis not present

## 2016-12-30 DIAGNOSIS — R300 Dysuria: Secondary | ICD-10-CM | POA: Diagnosis not present

## 2016-12-30 DIAGNOSIS — I10 Essential (primary) hypertension: Secondary | ICD-10-CM | POA: Diagnosis not present

## 2016-12-30 DIAGNOSIS — K219 Gastro-esophageal reflux disease without esophagitis: Secondary | ICD-10-CM | POA: Diagnosis not present

## 2016-12-31 ENCOUNTER — Ambulatory Visit: Payer: Medicare HMO

## 2016-12-31 ENCOUNTER — Ambulatory Visit
Admission: RE | Admit: 2016-12-31 | Discharge: 2016-12-31 | Disposition: A | Discharge status: Home / Self Care | Payer: Medicare HMO | Source: Ambulatory Visit | Attending: Radiation Oncology | Admitting: Radiation Oncology

## 2016-12-31 DIAGNOSIS — N390 Urinary tract infection, site not specified: Secondary | ICD-10-CM | POA: Diagnosis not present

## 2016-12-31 DIAGNOSIS — Z510 Encounter for antineoplastic radiation therapy: Secondary | ICD-10-CM | POA: Diagnosis not present

## 2016-12-31 DIAGNOSIS — E7800 Pure hypercholesterolemia, unspecified: Secondary | ICD-10-CM | POA: Diagnosis not present

## 2016-12-31 DIAGNOSIS — K219 Gastro-esophageal reflux disease without esophagitis: Secondary | ICD-10-CM | POA: Diagnosis not present

## 2016-12-31 DIAGNOSIS — I10 Essential (primary) hypertension: Secondary | ICD-10-CM | POA: Diagnosis not present

## 2016-12-31 DIAGNOSIS — R300 Dysuria: Secondary | ICD-10-CM | POA: Diagnosis not present

## 2016-12-31 DIAGNOSIS — M069 Rheumatoid arthritis, unspecified: Secondary | ICD-10-CM | POA: Diagnosis not present

## 2016-12-31 DIAGNOSIS — C61 Malignant neoplasm of prostate: Secondary | ICD-10-CM | POA: Diagnosis not present

## 2016-12-31 DIAGNOSIS — R1030 Lower abdominal pain, unspecified: Secondary | ICD-10-CM | POA: Diagnosis not present

## 2017-01-01 ENCOUNTER — Ambulatory Visit
Admission: RE | Admit: 2017-01-01 | Discharge: 2017-01-01 | Disposition: A | Discharge status: Home / Self Care | Payer: Medicare HMO | Source: Ambulatory Visit | Attending: Radiation Oncology | Admitting: Radiation Oncology

## 2017-01-01 ENCOUNTER — Ambulatory Visit: Payer: Medicare HMO

## 2017-01-01 DIAGNOSIS — I10 Essential (primary) hypertension: Secondary | ICD-10-CM | POA: Diagnosis not present

## 2017-01-01 DIAGNOSIS — R300 Dysuria: Secondary | ICD-10-CM | POA: Diagnosis not present

## 2017-01-01 DIAGNOSIS — M0589 Other rheumatoid arthritis with rheumatoid factor of multiple sites: Secondary | ICD-10-CM | POA: Diagnosis not present

## 2017-01-01 DIAGNOSIS — N390 Urinary tract infection, site not specified: Secondary | ICD-10-CM | POA: Diagnosis not present

## 2017-01-01 DIAGNOSIS — K219 Gastro-esophageal reflux disease without esophagitis: Secondary | ICD-10-CM | POA: Diagnosis not present

## 2017-01-01 DIAGNOSIS — E7800 Pure hypercholesterolemia, unspecified: Secondary | ICD-10-CM | POA: Diagnosis not present

## 2017-01-01 DIAGNOSIS — M069 Rheumatoid arthritis, unspecified: Secondary | ICD-10-CM | POA: Diagnosis not present

## 2017-01-01 DIAGNOSIS — R1030 Lower abdominal pain, unspecified: Secondary | ICD-10-CM | POA: Diagnosis not present

## 2017-01-01 DIAGNOSIS — C61 Malignant neoplasm of prostate: Secondary | ICD-10-CM | POA: Diagnosis not present

## 2017-01-01 DIAGNOSIS — M2550 Pain in unspecified joint: Secondary | ICD-10-CM | POA: Diagnosis not present

## 2017-01-01 DIAGNOSIS — E790 Hyperuricemia without signs of inflammatory arthritis and tophaceous disease: Secondary | ICD-10-CM | POA: Diagnosis not present

## 2017-01-01 DIAGNOSIS — Z510 Encounter for antineoplastic radiation therapy: Secondary | ICD-10-CM | POA: Diagnosis not present

## 2017-01-01 DIAGNOSIS — Z79899 Other long term (current) drug therapy: Secondary | ICD-10-CM | POA: Diagnosis not present

## 2017-01-02 ENCOUNTER — Encounter: Payer: Self-pay | Admitting: Radiation Oncology

## 2017-01-02 ENCOUNTER — Ambulatory Visit
Admission: RE | Admit: 2017-01-02 | Discharge: 2017-01-02 | Disposition: A | Discharge status: Home / Self Care | Payer: Medicare HMO | Source: Ambulatory Visit | Attending: Radiation Oncology | Admitting: Radiation Oncology

## 2017-01-02 DIAGNOSIS — E7800 Pure hypercholesterolemia, unspecified: Secondary | ICD-10-CM | POA: Diagnosis not present

## 2017-01-02 DIAGNOSIS — K219 Gastro-esophageal reflux disease without esophagitis: Secondary | ICD-10-CM | POA: Diagnosis not present

## 2017-01-02 DIAGNOSIS — I10 Essential (primary) hypertension: Secondary | ICD-10-CM | POA: Diagnosis not present

## 2017-01-02 DIAGNOSIS — C61 Malignant neoplasm of prostate: Secondary | ICD-10-CM | POA: Diagnosis not present

## 2017-01-02 DIAGNOSIS — R300 Dysuria: Secondary | ICD-10-CM | POA: Diagnosis not present

## 2017-01-02 DIAGNOSIS — R1030 Lower abdominal pain, unspecified: Secondary | ICD-10-CM | POA: Diagnosis not present

## 2017-01-02 DIAGNOSIS — M069 Rheumatoid arthritis, unspecified: Secondary | ICD-10-CM | POA: Diagnosis not present

## 2017-01-02 DIAGNOSIS — Z510 Encounter for antineoplastic radiation therapy: Secondary | ICD-10-CM | POA: Diagnosis not present

## 2017-01-02 DIAGNOSIS — N390 Urinary tract infection, site not specified: Secondary | ICD-10-CM | POA: Diagnosis not present

## 2017-01-02 NOTE — Progress Notes (Signed)
  Radiation Oncology         [phone] ________________________________  Name: Charles Frost MRN: [id number]  Date: 01/02/2017  DOB: [birth year]  End of Treatment Note  Diagnosis:   71 y.o. gentleman with stage T1c adenocarcinoma of the prostate with a Gleason's score of 4+4 and a PSA of 7.6     Indication for treatment:  Curative       Radiation treatment dates:   11/03/2016 to 01/02/2017  Site/dose:    1. The pelvis was treated to 45 Gy in 25 fractions at 1.8 Gy per fraction. 2. The prostate was boosted to 30 Gy in 15 fractions at 2 Gy per fraction.   Beams/energy:    1. IMRT // 6X 2. IMRT // 6X  Narrative: The patient tolerated radiation treatment relatively well.   Reports pain and burning with urination. Denies hematuria or increased urination. Patient feels positive he is not emptying his bladder during the day but has better luck during the night after taking Flomax. Reports nocturia x 4-5. Reports constipation has resolved.  Plan: The patient has completed radiation treatment. The patient will return to radiation oncology clinic for routine followup in one month. I advised him to call or return sooner if he has any questions or concerns related to his recovery or treatment. ________________________________  Sheral Apley. Tammi Klippel, M.D.   This document serves as a record of services personally performed by [name], MD. It was created on his behalf by Arlyce Harman, a trained medical scribe. The creation of this record is based on the scribe's personal observations and the provider's statements to them. This document has been checked and approved by the attending provider.

## 2017-02-03 ENCOUNTER — Ambulatory Visit
Admission: RE | Admit: 2017-02-03 | Discharge: 2017-02-03 | Disposition: A | Discharge status: Home / Self Care | Payer: Medicare HMO | Source: Ambulatory Visit | Attending: Urology | Admitting: Urology

## 2017-02-03 ENCOUNTER — Encounter: Payer: Self-pay | Admitting: Urology

## 2017-02-03 VITALS — BP 132/83 | HR 89 | Temp 98.5°F | Resp 18 | Ht 67.0 in | Wt 183.0 lb

## 2017-02-03 DIAGNOSIS — C61 Malignant neoplasm of prostate: Secondary | ICD-10-CM | POA: Diagnosis not present

## 2017-02-03 DIAGNOSIS — Z79899 Other long term (current) drug therapy: Secondary | ICD-10-CM | POA: Insufficient documentation

## 2017-02-03 NOTE — Progress Notes (Signed)
Radiation Oncology         [phone] ________________________________  Name: Charles Frost MRN: [id number]  Date: 02/03/2017  DOB: 12/28/1945  Post Treatment Note  CC: Patient, No Pcp Per  Carolan Clines, MD  Diagnosis:   71 y.o. gentleman with stage T1c adenocarcinoma of the prostate with a Gleason's score of 4+4 and a PSA of 7.6      Interval Since Last Radiation:  4.5 weeks  11/03/2016 to 01/02/2017   1. The pelvis was treated to 45 Gy in 25 fractions at 1.8 Gy per fraction. 2. The prostate was boosted to 30 Gy in 15 fractions at 2 Gy per fraction.    Narrative:  The patient returns today for routine follow-up.  Throughout his course of treatment, he experienced dysuria, bladder pain and spasms, increased frequency, urgency and urge incontinence. He reported nocturia 4-5 times per night.   He was treated for a UTI with culture directed doxycycline which he completed as prescribed. Additionally, he was prescribed pyridium and Flomax to help with dysuria and LUTS.  Unfortunately, his bladder pain and spasms were not well controlled and he required hospitalization on 12/07/2016. At that time, he was prescribed oxybutynin, Myrbetriq 50mg , Levsin and hydrocodone.  He required taking a break from radiation treatments for several days but medications were eventually able to control his symptoms such that he was able to resume and complete the prescribed course of radiotherapy.                      On review of systems, the patient states that his lower urinary tract symptoms are gradually improving. The dysuria has almost completely resolved. He continues with increased frequency, urgency and minimal urge incontinence. He continues to wear depends for protection. He denies any gross hematuria, flank pain, fever or chills. He has experienced some constipation recently but is managing this with dietary changes and increased water consumption. He denies abdominal pain, nausea or vomiting. He  reports a healthy appetite and has actually gained a few pounds since completing treatment. He continues to use Flomax daily as he can appreciate a significant improvement when he is taking this. He has completed his antibiotics at this point and has discontinued use of Myrbetriq, Pyridium and oxybutynin.  ALLERGIES:  has No Known Allergies.  Meds: Current Outpatient Prescriptions  Medication Sig Dispense Refill  . folic acid (FOLVITE) 1 MG tablet Take 1 mg by mouth daily.    . predniSONE (DELTASONE) 5 MG tablet     . tamsulosin (FLOMAX) 0.4 MG CAPS capsule Take 2 capsules (0.8 mg total) by mouth daily after supper. 30 capsule 2  . HYDROcodone-acetaminophen (NORCO/VICODIN) 5-325 MG tablet Take 1 tablet by mouth every 6 (six) hours as needed for moderate pain. (Patient not taking: Reported on 02/03/2017) 30 tablet 0  . Meth-Hyo-M Bl-Na Phos-Ph Sal (URO-MP) 118 MG CAPS Take 1 tablet by mouth 3 (three) times daily. (Patient not taking: Reported on 12/11/2016)    . mirabegron ER (MYRBETRIQ) 50 MG TB24 tablet Take 1 tablet (50 mg total) by mouth daily. (Patient not taking: Reported on 12/11/2016) 30 tablet 0  . opium-belladonna (B&O SUPPRETTES) 16.2-60 MG suppository Place 1 suppository rectally every 6 (six) hours as needed for bladder spasms. (Patient not taking: Reported on 12/11/2016) 60 suppository 0  . oxybutynin (DITROPAN) 5 MG tablet Take 1 tablet (5 mg total) by mouth 3 (three) times daily. (Patient not taking: Reported on 02/03/2017) 90 tablet 0  .  phenazopyridine (PYRIDIUM) 200 MG tablet Take 1 tablet (200 mg total) by mouth 3 (three) times daily. (Patient not taking: Reported on 12/11/2016) 10 tablet 0   No current facility-administered medications for this encounter.     Physical Findings:  height is 5\' 7"  (1.702 m) and weight is 183 lb (83 kg). His oral temperature is 98.5 F (36.9 C). His blood pressure is 132/83 and his pulse is 89. His respiration is 18 and oxygen saturation is 100%.  Pain  Assessment Pain Score: 0-No pain/10 In general this is a well appearing African-American in no acute distress. He's alert and oriented x4 and appropriate throughout the examination. Cardiopulmonary assessment is negative for acute distress and he exhibits normal effort.   Lab Findings: Lab Results  Component Value Date   WBC 5.8 12/08/2016   HGB 11.4 (L) 12/08/2016   HCT 35.2 (L) 12/08/2016   MCV 84.4 12/08/2016   PLT 284 12/08/2016     Radiographic Findings: No results found.  Impression/Plan: 47.  71 yo gentleman with Stage T1c adenocarcinoma of the prostate with a Gleason's score of 4+4 and a PSA of 7.6. He will continue to follow up with urology for ongoing PSA determinations and has an appointment scheduled with Dr. Gaynelle Arabian on 02/24/17.  He will have his PSA draqwn on 02/19/17 prior to his follow up visit.  He understands what to expect with regards to PSA monitoring going forward. I will look forward to following his response via correspondence with urology, and would be happy to continue to participate in his care if clinically indicated. I talked to the patient about what to expect in the future, including his risk for erectile dysfunction rectal bleeding. I advised that he continue using Flomax daily until his follow up visit with Dr. Gaynelle Arabian and at that point may consider a trial off the medication to see how he does.  I encouraged him to call or return to the office if he has any questions regarding his previous radiation or possible radiation side effects. He was comfortable with this plan and will follow up as needed.    Nicholos Johns, PA-C

## 2017-02-13 NOTE — Addendum Note (Signed)
Encounter addended by: Heywood Footman, RN on: 02/13/2017 12:49 PM<BR>    Actions taken: Actions taken from a BestPractice Advisory, Visit Navigator Flowsheet section accepted, Order list changed, Diagnosis association updated

## 2017-02-19 ENCOUNTER — Encounter: Payer: Self-pay | Admitting: *Deleted

## 2017-02-19 DIAGNOSIS — C61 Malignant neoplasm of prostate: Secondary | ICD-10-CM | POA: Diagnosis not present

## 2017-02-19 NOTE — Progress Notes (Signed)
[place] Psychosocial Distress Screening Clinical Social Work  Clinical Social Work was referred by distress screening protocol.  The patient scored a 7 on the Psychosocial Distress Thermometer which indicates moderate distress. Clinical Social Worker attempted to call patient to assess for distress and other psychosocial needs.   ONCBCN DISTRESS SCREENING 02/03/2017  Screening Type Initial Screening  Distress experienced in past week (1-10) 7  Emotional problem type Depression;Nervousness/Anxiety;Adjusting to illness  Spiritual/Religous concerns type Loss of sense of purpose  Information Concerns Type Lack of info about maintaining fitness  Physical Problem type Loss of appetitie;Constipation/diarrhea;Changes in urination;Sexual problems  Physician notified of physical symptoms Yes  Referral to clinical psychology No  Referral to clinical social work Yes  Referral to dietition No  Referral to financial advocate No  Referral to support programs No  Referral to palliative care No    [place], MSW, LCSW, OSW-C Clinical Social Worker [place] [phone]

## 2017-02-24 DIAGNOSIS — C61 Malignant neoplasm of prostate: Secondary | ICD-10-CM | POA: Diagnosis not present

## 2017-02-24 DIAGNOSIS — R972 Elevated prostate specific antigen [PSA]: Secondary | ICD-10-CM | POA: Diagnosis not present

## 2017-02-24 DIAGNOSIS — N3040 Irradiation cystitis without hematuria: Secondary | ICD-10-CM | POA: Diagnosis not present

## 2017-04-15 DIAGNOSIS — Z79899 Other long term (current) drug therapy: Secondary | ICD-10-CM | POA: Diagnosis not present

## 2017-04-15 DIAGNOSIS — E790 Hyperuricemia without signs of inflammatory arthritis and tophaceous disease: Secondary | ICD-10-CM | POA: Diagnosis not present

## 2017-04-15 DIAGNOSIS — M0589 Other rheumatoid arthritis with rheumatoid factor of multiple sites: Secondary | ICD-10-CM | POA: Diagnosis not present

## 2017-04-15 DIAGNOSIS — M2550 Pain in unspecified joint: Secondary | ICD-10-CM | POA: Diagnosis not present

## 2017-05-21 DIAGNOSIS — M0589 Other rheumatoid arthritis with rheumatoid factor of multiple sites: Secondary | ICD-10-CM | POA: Diagnosis not present

## 2017-05-21 DIAGNOSIS — M2550 Pain in unspecified joint: Secondary | ICD-10-CM | POA: Diagnosis not present

## 2017-05-21 DIAGNOSIS — Z79899 Other long term (current) drug therapy: Secondary | ICD-10-CM | POA: Diagnosis not present

## 2017-05-21 DIAGNOSIS — E790 Hyperuricemia without signs of inflammatory arthritis and tophaceous disease: Secondary | ICD-10-CM | POA: Diagnosis not present

## 2017-05-26 DIAGNOSIS — C61 Malignant neoplasm of prostate: Secondary | ICD-10-CM | POA: Diagnosis not present

## 2017-07-03 DIAGNOSIS — Z23 Encounter for immunization: Secondary | ICD-10-CM | POA: Diagnosis not present

## 2017-07-06 IMAGING — CT CT RENAL STONE PROTOCOL
2 of 3 series · 15 of 46 positions shown, 17 images · non-contrast
Comparison: 08/08/2016

CLINICAL DATA: Hematuria.  Bladder spasms.

EXAM:
CT ABDOMEN AND PELVIS WITHOUT CONTRAST
TECHNIQUE: Multidetector CT imaging of the abdomen and pelvis was performed
following the standard protocol without IV contrast.

[Series 4: lung · axial · 0.74mm/px · z∈[+1335,+1475]mm · 12 of 82 slices shown, 14 images]
[im 6/82  soft-tissue]
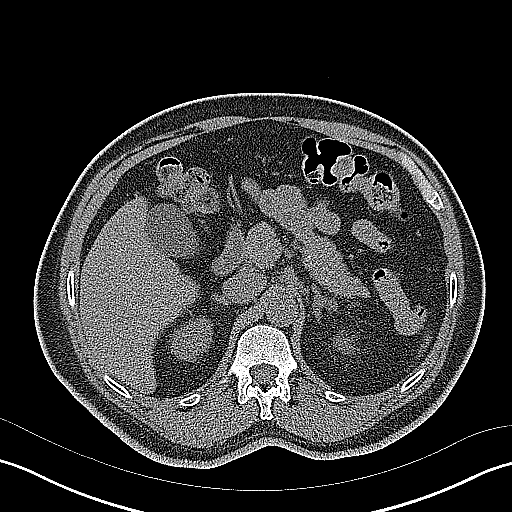
[im 6/82  bone]
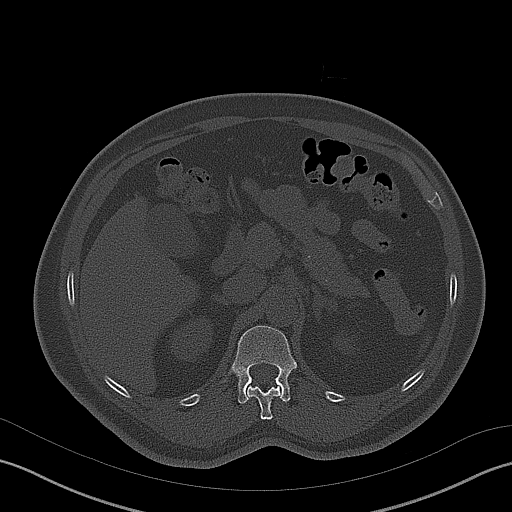
[im 11/82  soft-tissue]
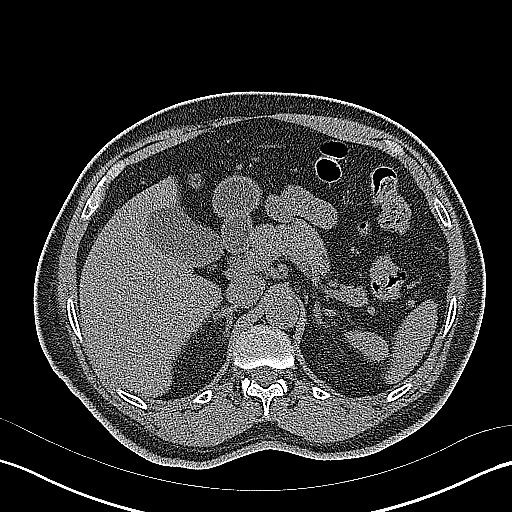
[im 19/82  soft-tissue]
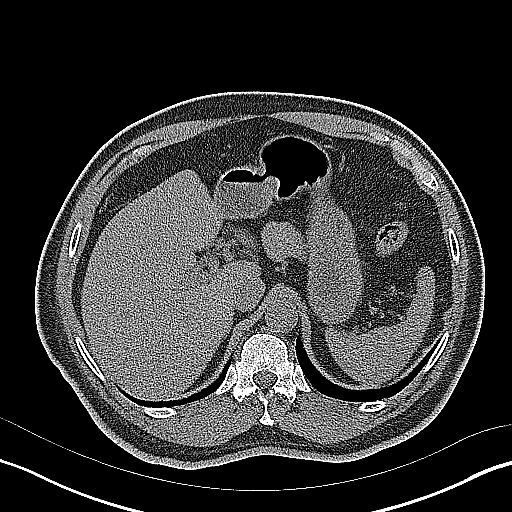
[im 24/82  soft-tissue]
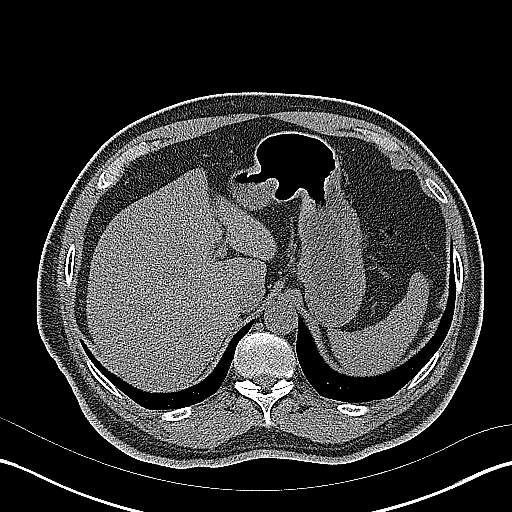
[im 32/82  soft-tissue]
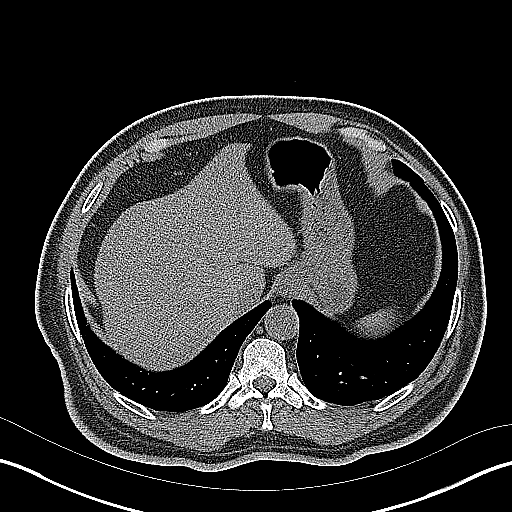
[im 37/82  soft-tissue]
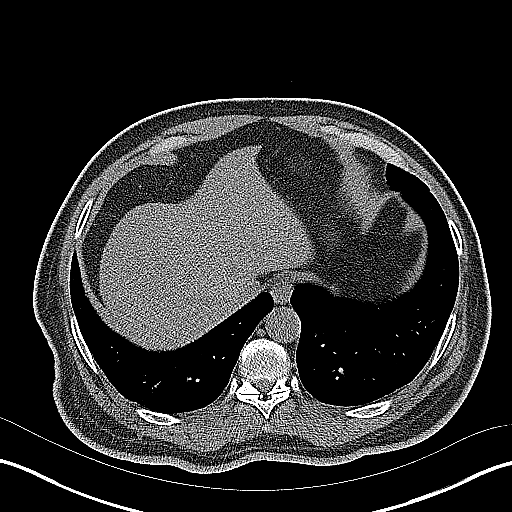
[im 45/82  soft-tissue]
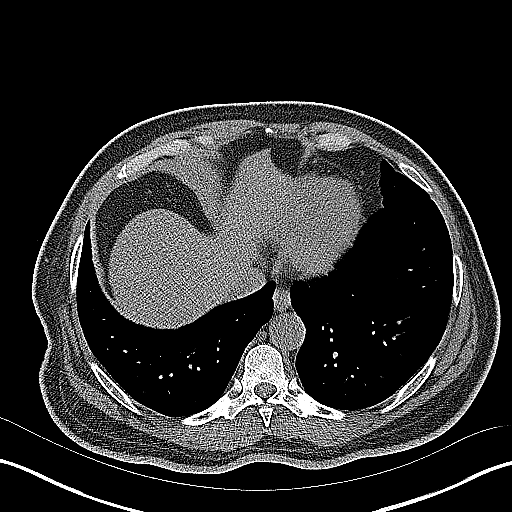
[im 50/82  soft-tissue]
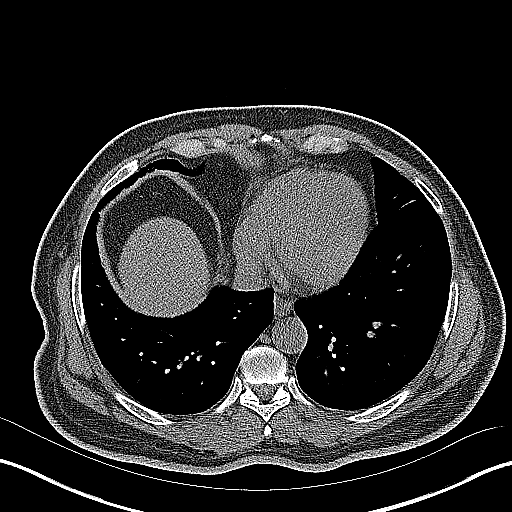
[im 58/82  soft-tissue]
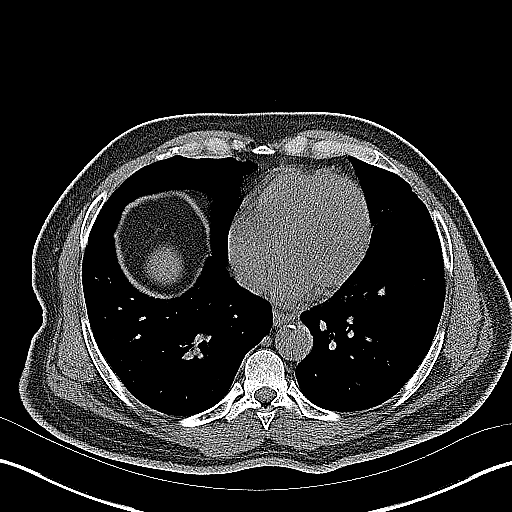
[im 58/82  bone]
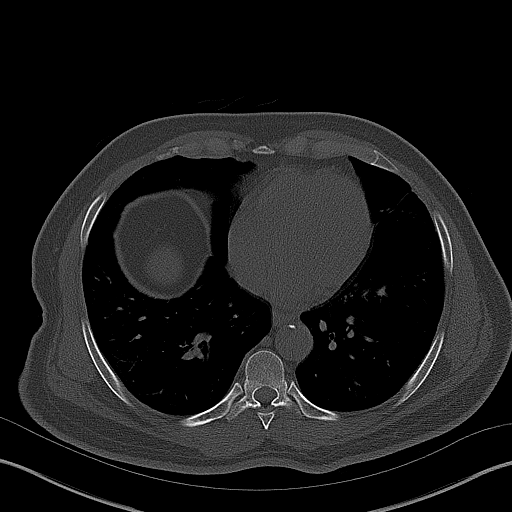
[im 63/82  soft-tissue]
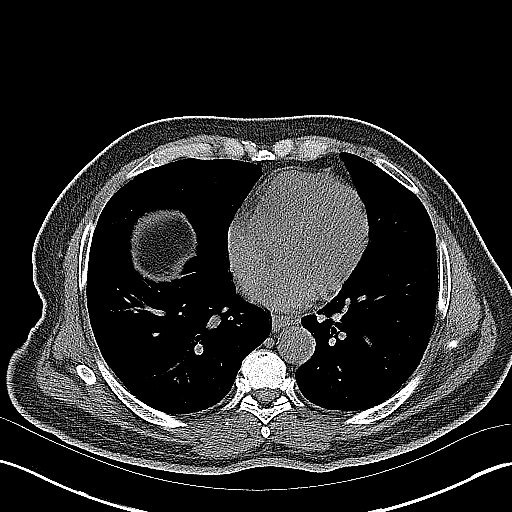
[im 71/82  soft-tissue]
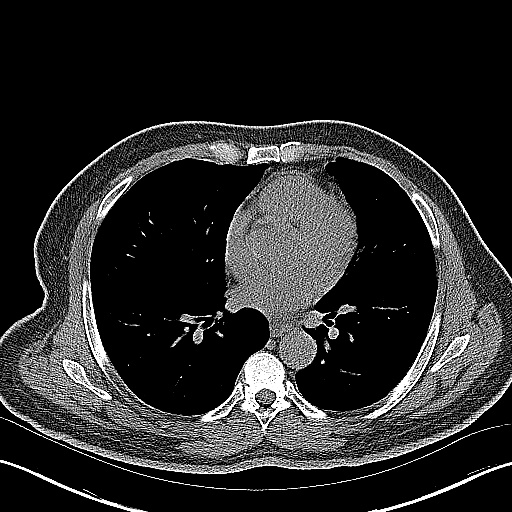
[im 76/82  soft-tissue]
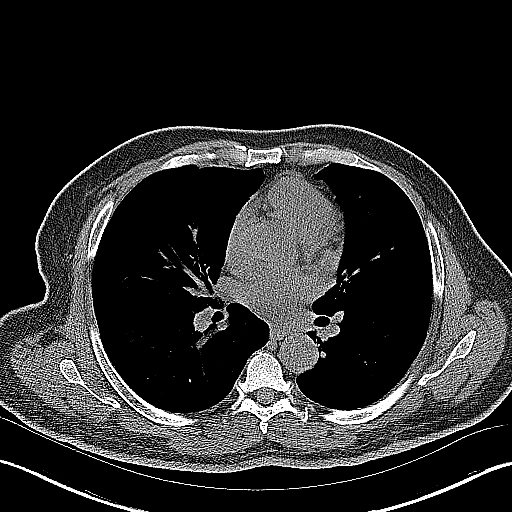

[Series 5: coronal · coronal · 0.74mm/px · 3 of 137 slices shown]
[im 46/137  soft-tissue]
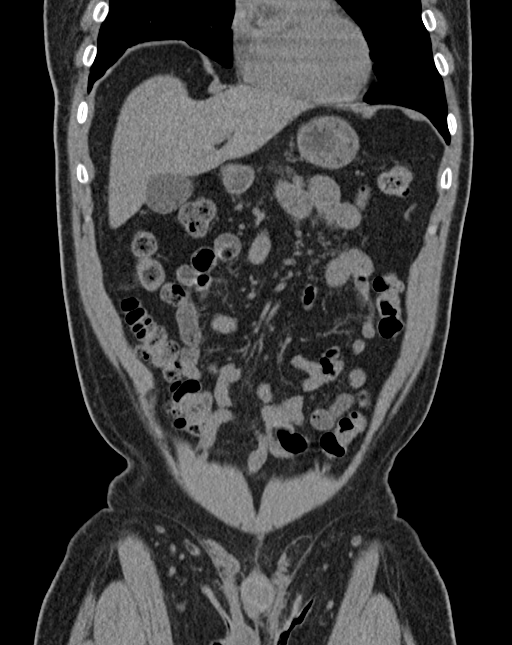
[im 61/137  soft-tissue]
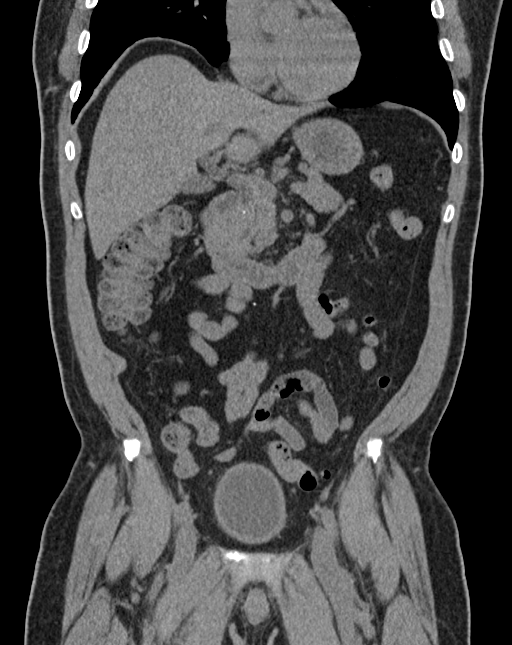
[im 76/137  soft-tissue]
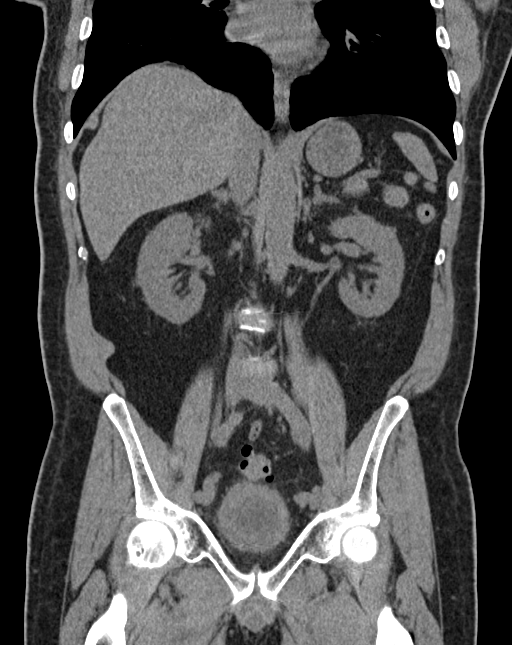

[15 of 46 positions shown; findings below may reference images not displayed]

FINDINGS: Lower chest:  Unremarkable.

Hepatobiliary: Tiny hypoattenuating lesions again identified
inferior right liver, stable. There is no evidence for gallstones,
gallbladder wall thickening, or pericholecystic fluid. No
intrahepatic or extrahepatic biliary dilation.

Pancreas: Scattered parenchymal calcification in the pancreatic
parenchyma suggests possible chronic pancreatitis.

Spleen: No splenomegaly. No focal mass lesion.

Adrenals/Urinary Tract: No adrenal nodule or mass. Patient had
multiple bilateral nonobstructing renal stones on the previous study
which have decreased in the interval. Several very tiny
nonobstructing stones are seen in the right kidney (best seen on
coronal images 90, 89, 81 and 76). 1-2 mm nonobstructing stone
identified upper pole left kidney on coronal images. No ureteral or
bladder stones. Circumferential bladder wall thickening is most
pronounced posteriorly adjacent to the prostate gland.

Stomach/Bowel: Stomach is nondistended. No gastric wall thickening.
No evidence of outlet obstruction. Duodenum is normally positioned
as is the ligament of Treitz. No small bowel wall thickening. No
small bowel dilatation. The terminal ileum is normal. The appendix
is normal. Diverticular changes are noted in the left colon without
evidence of diverticulitis.

Vascular/Lymphatic: There is abdominal aortic atherosclerosis
without aneurysm. There is no gastrohepatic or hepatoduodenal
ligament lymphadenopathy. No intraperitoneal or retroperitoneal
lymphadenopathy. No pelvic sidewall lymphadenopathy.

Reproductive: Brachytherapy seeds noted in the prostate gland.

Other: No intraperitoneal free fluid.

Musculoskeletal: Left groin hernia contains only fat. Bone windows
reveal no worrisome lytic or sclerotic osseous lesions.
IMPRESSION: 1. Multiple bilateral tiny nonobstructing renal stones although
stone burden in the kidneys is decreased in the interval. No
evidence for ureteral or bladder stones.
2. Circumferential bladder wall thickening, most pronounced
posteriorly adjacent to the prostate gland. Given the history of
pelvic radiation, this may be related to treatment. Bladder
infection could have a similar appearance.
3.  Abdominal Aortic Atherosclerois ([id number]-170.0)

## 2017-07-16 ENCOUNTER — Telehealth: Payer: Self-pay | Admitting: Radiation Oncology

## 2017-07-16 NOTE — Telephone Encounter (Signed)
Received refill request for patient's TAMSULOSIN from [facility]. Faxed a NOT AUTHORIZED reply back to [phone] and received confirmation of delivery. Patient should seek refill from urologist per [name], PA-C.

## 2017-08-14 DIAGNOSIS — C61 Malignant neoplasm of prostate: Secondary | ICD-10-CM | POA: Diagnosis not present

## 2017-08-20 DIAGNOSIS — M7551 Bursitis of right shoulder: Secondary | ICD-10-CM | POA: Diagnosis not present

## 2017-08-20 DIAGNOSIS — Z79899 Other long term (current) drug therapy: Secondary | ICD-10-CM | POA: Diagnosis not present

## 2017-08-20 DIAGNOSIS — E790 Hyperuricemia without signs of inflammatory arthritis and tophaceous disease: Secondary | ICD-10-CM | POA: Diagnosis not present

## 2017-08-20 DIAGNOSIS — M0589 Other rheumatoid arthritis with rheumatoid factor of multiple sites: Secondary | ICD-10-CM | POA: Diagnosis not present

## 2017-08-24 DIAGNOSIS — C61 Malignant neoplasm of prostate: Secondary | ICD-10-CM | POA: Diagnosis not present

## 2017-10-12 DIAGNOSIS — C61 Malignant neoplasm of prostate: Secondary | ICD-10-CM | POA: Diagnosis not present

## 2017-10-12 DIAGNOSIS — K05219 Aggressive periodontitis, localized, unspecified severity: Secondary | ICD-10-CM | POA: Diagnosis not present

## 2017-11-18 DIAGNOSIS — Z79899 Other long term (current) drug therapy: Secondary | ICD-10-CM | POA: Diagnosis not present

## 2017-11-18 DIAGNOSIS — M7551 Bursitis of right shoulder: Secondary | ICD-10-CM | POA: Diagnosis not present

## 2017-11-18 DIAGNOSIS — E790 Hyperuricemia without signs of inflammatory arthritis and tophaceous disease: Secondary | ICD-10-CM | POA: Diagnosis not present

## 2017-11-18 DIAGNOSIS — M0589 Other rheumatoid arthritis with rheumatoid factor of multiple sites: Secondary | ICD-10-CM | POA: Diagnosis not present

## 2017-12-22 DIAGNOSIS — Z6829 Body mass index (BMI) 29.0-29.9, adult: Secondary | ICD-10-CM | POA: Diagnosis not present

## 2017-12-22 DIAGNOSIS — L723 Sebaceous cyst: Secondary | ICD-10-CM | POA: Diagnosis not present

## 2017-12-24 DIAGNOSIS — H25813 Combined forms of age-related cataract, bilateral: Secondary | ICD-10-CM | POA: Diagnosis not present

## 2017-12-31 DIAGNOSIS — L02212 Cutaneous abscess of back [any part, except buttock]: Secondary | ICD-10-CM | POA: Diagnosis not present

## 2018-01-01 DIAGNOSIS — L02212 Cutaneous abscess of back [any part, except buttock]: Secondary | ICD-10-CM | POA: Diagnosis not present

## 2018-02-04 DIAGNOSIS — C61 Malignant neoplasm of prostate: Secondary | ICD-10-CM | POA: Diagnosis not present

## 2018-02-08 DIAGNOSIS — C61 Malignant neoplasm of prostate: Secondary | ICD-10-CM | POA: Diagnosis not present

## 2018-02-16 DIAGNOSIS — Z8546 Personal history of malignant neoplasm of prostate: Secondary | ICD-10-CM | POA: Diagnosis not present

## 2018-02-16 DIAGNOSIS — R351 Nocturia: Secondary | ICD-10-CM | POA: Diagnosis not present

## 2018-02-16 DIAGNOSIS — N401 Enlarged prostate with lower urinary tract symptoms: Secondary | ICD-10-CM | POA: Diagnosis not present

## 2018-05-20 DIAGNOSIS — M0589 Other rheumatoid arthritis with rheumatoid factor of multiple sites: Secondary | ICD-10-CM | POA: Diagnosis not present

## 2018-05-20 DIAGNOSIS — Z79899 Other long term (current) drug therapy: Secondary | ICD-10-CM | POA: Diagnosis not present

## 2018-05-20 DIAGNOSIS — M7551 Bursitis of right shoulder: Secondary | ICD-10-CM | POA: Diagnosis not present

## 2018-05-20 DIAGNOSIS — E790 Hyperuricemia without signs of inflammatory arthritis and tophaceous disease: Secondary | ICD-10-CM | POA: Diagnosis not present

## 2018-06-09 DIAGNOSIS — Z23 Encounter for immunization: Secondary | ICD-10-CM | POA: Diagnosis not present

## 2018-07-12 DIAGNOSIS — Z79899 Other long term (current) drug therapy: Secondary | ICD-10-CM | POA: Diagnosis not present

## 2018-08-09 DIAGNOSIS — R55 Syncope and collapse: Secondary | ICD-10-CM | POA: Diagnosis not present

## 2018-08-09 DIAGNOSIS — C61 Malignant neoplasm of prostate: Secondary | ICD-10-CM | POA: Diagnosis not present

## 2018-08-09 DIAGNOSIS — W19XXXA Unspecified fall, initial encounter: Secondary | ICD-10-CM | POA: Diagnosis not present

## 2018-08-09 DIAGNOSIS — I10 Essential (primary) hypertension: Secondary | ICD-10-CM | POA: Diagnosis not present

## 2018-08-13 DIAGNOSIS — N5201 Erectile dysfunction due to arterial insufficiency: Secondary | ICD-10-CM | POA: Diagnosis not present

## 2018-08-13 DIAGNOSIS — C61 Malignant neoplasm of prostate: Secondary | ICD-10-CM | POA: Diagnosis not present

## 2018-08-19 DIAGNOSIS — C61 Malignant neoplasm of prostate: Secondary | ICD-10-CM | POA: Diagnosis not present

## 2018-08-19 DIAGNOSIS — M0589 Other rheumatoid arthritis with rheumatoid factor of multiple sites: Secondary | ICD-10-CM | POA: Diagnosis not present

## 2018-08-19 DIAGNOSIS — M1990 Unspecified osteoarthritis, unspecified site: Secondary | ICD-10-CM | POA: Diagnosis not present

## 2018-08-19 DIAGNOSIS — M7989 Other specified soft tissue disorders: Secondary | ICD-10-CM | POA: Diagnosis not present

## 2018-08-19 DIAGNOSIS — E790 Hyperuricemia without signs of inflammatory arthritis and tophaceous disease: Secondary | ICD-10-CM | POA: Diagnosis not present

## 2018-08-19 DIAGNOSIS — Z79899 Other long term (current) drug therapy: Secondary | ICD-10-CM | POA: Diagnosis not present

## 2018-10-05 DIAGNOSIS — I10 Essential (primary) hypertension: Secondary | ICD-10-CM | POA: Diagnosis not present

## 2018-10-05 DIAGNOSIS — Z6829 Body mass index (BMI) 29.0-29.9, adult: Secondary | ICD-10-CM | POA: Diagnosis not present

## 2018-10-05 DIAGNOSIS — R0789 Other chest pain: Secondary | ICD-10-CM | POA: Diagnosis not present

## 2018-10-05 DIAGNOSIS — K219 Gastro-esophageal reflux disease without esophagitis: Secondary | ICD-10-CM | POA: Diagnosis not present

## 2018-12-01 DIAGNOSIS — L02212 Cutaneous abscess of back [any part, except buttock]: Secondary | ICD-10-CM | POA: Diagnosis not present

## 2018-12-01 DIAGNOSIS — Z6827 Body mass index (BMI) 27.0-27.9, adult: Secondary | ICD-10-CM | POA: Diagnosis not present

## 2019-02-14 DIAGNOSIS — H25813 Combined forms of age-related cataract, bilateral: Secondary | ICD-10-CM | POA: Diagnosis not present

## 2019-02-17 DIAGNOSIS — R351 Nocturia: Secondary | ICD-10-CM | POA: Diagnosis not present

## 2019-02-17 DIAGNOSIS — N5201 Erectile dysfunction due to arterial insufficiency: Secondary | ICD-10-CM | POA: Diagnosis not present

## 2019-02-17 DIAGNOSIS — Z8546 Personal history of malignant neoplasm of prostate: Secondary | ICD-10-CM | POA: Diagnosis not present

## 2019-04-28 DIAGNOSIS — M069 Rheumatoid arthritis, unspecified: Secondary | ICD-10-CM | POA: Diagnosis not present

## 2019-04-28 DIAGNOSIS — Z0000 Encounter for general adult medical examination without abnormal findings: Secondary | ICD-10-CM | POA: Diagnosis not present

## 2019-04-28 DIAGNOSIS — I10 Essential (primary) hypertension: Secondary | ICD-10-CM | POA: Diagnosis not present

## 2019-04-28 DIAGNOSIS — C61 Malignant neoplasm of prostate: Secondary | ICD-10-CM | POA: Diagnosis not present

## 2019-04-28 DIAGNOSIS — Z6829 Body mass index (BMI) 29.0-29.9, adult: Secondary | ICD-10-CM | POA: Diagnosis not present

## 2019-04-28 DIAGNOSIS — Z23 Encounter for immunization: Secondary | ICD-10-CM | POA: Diagnosis not present

## 2019-04-28 DIAGNOSIS — R413 Other amnesia: Secondary | ICD-10-CM | POA: Diagnosis not present

## 2019-04-28 DIAGNOSIS — Z79899 Other long term (current) drug therapy: Secondary | ICD-10-CM | POA: Diagnosis not present

## 2019-04-28 DIAGNOSIS — E7800 Pure hypercholesterolemia, unspecified: Secondary | ICD-10-CM | POA: Diagnosis not present

## 2019-04-28 DIAGNOSIS — K219 Gastro-esophageal reflux disease without esophagitis: Secondary | ICD-10-CM | POA: Diagnosis not present

## 2019-06-09 DIAGNOSIS — Z23 Encounter for immunization: Secondary | ICD-10-CM | POA: Diagnosis not present

## 2019-08-19 DIAGNOSIS — C61 Malignant neoplasm of prostate: Secondary | ICD-10-CM | POA: Diagnosis not present

## 2019-12-13 DIAGNOSIS — L723 Sebaceous cyst: Secondary | ICD-10-CM | POA: Diagnosis not present

## 2020-01-16 DIAGNOSIS — K625 Hemorrhage of anus and rectum: Secondary | ICD-10-CM | POA: Diagnosis not present

## 2020-01-16 DIAGNOSIS — K219 Gastro-esophageal reflux disease without esophagitis: Secondary | ICD-10-CM | POA: Diagnosis not present

## 2020-01-16 DIAGNOSIS — Z6829 Body mass index (BMI) 29.0-29.9, adult: Secondary | ICD-10-CM | POA: Diagnosis not present

## 2020-01-16 DIAGNOSIS — Z86010 Personal history of colonic polyps: Secondary | ICD-10-CM | POA: Diagnosis not present

## 2020-01-31 DIAGNOSIS — M5431 Sciatica, right side: Secondary | ICD-10-CM | POA: Diagnosis not present

## 2020-01-31 DIAGNOSIS — Z6828 Body mass index (BMI) 28.0-28.9, adult: Secondary | ICD-10-CM | POA: Diagnosis not present

## 2020-01-31 DIAGNOSIS — D649 Anemia, unspecified: Secondary | ICD-10-CM | POA: Diagnosis not present

## 2020-01-31 DIAGNOSIS — M069 Rheumatoid arthritis, unspecified: Secondary | ICD-10-CM | POA: Diagnosis not present

## 2020-02-07 DIAGNOSIS — Z8546 Personal history of malignant neoplasm of prostate: Secondary | ICD-10-CM | POA: Diagnosis not present

## 2020-02-15 DIAGNOSIS — H25813 Combined forms of age-related cataract, bilateral: Secondary | ICD-10-CM | POA: Diagnosis not present

## 2020-02-15 DIAGNOSIS — N400 Enlarged prostate without lower urinary tract symptoms: Secondary | ICD-10-CM | POA: Diagnosis not present

## 2020-02-15 DIAGNOSIS — Z8546 Personal history of malignant neoplasm of prostate: Secondary | ICD-10-CM | POA: Diagnosis not present

## 2020-02-23 DIAGNOSIS — M5431 Sciatica, right side: Secondary | ICD-10-CM | POA: Diagnosis not present

## 2020-02-23 DIAGNOSIS — Z6829 Body mass index (BMI) 29.0-29.9, adult: Secondary | ICD-10-CM | POA: Diagnosis not present

## 2020-05-03 DIAGNOSIS — Z6829 Body mass index (BMI) 29.0-29.9, adult: Secondary | ICD-10-CM | POA: Diagnosis not present

## 2020-05-03 DIAGNOSIS — R222 Localized swelling, mass and lump, trunk: Secondary | ICD-10-CM | POA: Diagnosis not present

## 2020-05-03 DIAGNOSIS — M5431 Sciatica, right side: Secondary | ICD-10-CM | POA: Diagnosis not present

## 2020-06-20 DIAGNOSIS — Z9181 History of falling: Secondary | ICD-10-CM | POA: Diagnosis not present

## 2020-06-20 DIAGNOSIS — Z1331 Encounter for screening for depression: Secondary | ICD-10-CM | POA: Diagnosis not present

## 2020-06-20 DIAGNOSIS — Z139 Encounter for screening, unspecified: Secondary | ICD-10-CM | POA: Diagnosis not present

## 2020-06-20 DIAGNOSIS — L723 Sebaceous cyst: Secondary | ICD-10-CM | POA: Diagnosis not present

## 2020-06-20 DIAGNOSIS — Z23 Encounter for immunization: Secondary | ICD-10-CM | POA: Diagnosis not present

## 2020-07-04 DIAGNOSIS — L0291 Cutaneous abscess, unspecified: Secondary | ICD-10-CM | POA: Diagnosis not present

## 2020-10-30 DIAGNOSIS — Z139 Encounter for screening, unspecified: Secondary | ICD-10-CM | POA: Diagnosis not present

## 2020-10-30 DIAGNOSIS — Z9181 History of falling: Secondary | ICD-10-CM | POA: Diagnosis not present

## 2020-10-30 DIAGNOSIS — Z0000 Encounter for general adult medical examination without abnormal findings: Secondary | ICD-10-CM | POA: Diagnosis not present

## 2020-10-30 DIAGNOSIS — Z1331 Encounter for screening for depression: Secondary | ICD-10-CM | POA: Diagnosis not present

## 2020-11-14 DIAGNOSIS — Z6829 Body mass index (BMI) 29.0-29.9, adult: Secondary | ICD-10-CM | POA: Diagnosis not present

## 2020-11-14 DIAGNOSIS — L723 Sebaceous cyst: Secondary | ICD-10-CM | POA: Diagnosis not present

## 2020-11-14 DIAGNOSIS — A498 Other bacterial infections of unspecified site: Secondary | ICD-10-CM | POA: Diagnosis not present

## 2021-02-18 DIAGNOSIS — R079 Chest pain, unspecified: Secondary | ICD-10-CM | POA: Diagnosis not present

## 2021-02-18 DIAGNOSIS — Z6829 Body mass index (BMI) 29.0-29.9, adult: Secondary | ICD-10-CM | POA: Diagnosis not present

## 2021-02-18 DIAGNOSIS — K219 Gastro-esophageal reflux disease without esophagitis: Secondary | ICD-10-CM | POA: Diagnosis not present

## 2021-03-04 DIAGNOSIS — Z6829 Body mass index (BMI) 29.0-29.9, adult: Secondary | ICD-10-CM | POA: Diagnosis not present

## 2021-03-04 DIAGNOSIS — R079 Chest pain, unspecified: Secondary | ICD-10-CM | POA: Diagnosis not present

## 2021-03-04 DIAGNOSIS — K219 Gastro-esophageal reflux disease without esophagitis: Secondary | ICD-10-CM | POA: Diagnosis not present

## 2021-03-04 DIAGNOSIS — L918 Other hypertrophic disorders of the skin: Secondary | ICD-10-CM | POA: Diagnosis not present

## 2021-03-20 DIAGNOSIS — L918 Other hypertrophic disorders of the skin: Secondary | ICD-10-CM | POA: Diagnosis not present

## 2021-03-25 DIAGNOSIS — C61 Malignant neoplasm of prostate: Secondary | ICD-10-CM | POA: Diagnosis not present

## 2021-04-01 DIAGNOSIS — Z8546 Personal history of malignant neoplasm of prostate: Secondary | ICD-10-CM | POA: Diagnosis not present

## 2021-04-01 DIAGNOSIS — N401 Enlarged prostate with lower urinary tract symptoms: Secondary | ICD-10-CM | POA: Diagnosis not present

## 2021-04-01 DIAGNOSIS — R351 Nocturia: Secondary | ICD-10-CM | POA: Diagnosis not present

## 2021-06-27 DIAGNOSIS — M069 Rheumatoid arthritis, unspecified: Secondary | ICD-10-CM | POA: Diagnosis not present

## 2021-06-27 DIAGNOSIS — M5440 Lumbago with sciatica, unspecified side: Secondary | ICD-10-CM | POA: Diagnosis not present

## 2021-09-23 DIAGNOSIS — Z8546 Personal history of malignant neoplasm of prostate: Secondary | ICD-10-CM | POA: Diagnosis not present

## 2021-09-30 DIAGNOSIS — R350 Frequency of micturition: Secondary | ICD-10-CM | POA: Diagnosis not present

## 2021-09-30 DIAGNOSIS — C61 Malignant neoplasm of prostate: Secondary | ICD-10-CM | POA: Diagnosis not present

## 2021-09-30 DIAGNOSIS — R351 Nocturia: Secondary | ICD-10-CM | POA: Diagnosis not present

## 2021-09-30 DIAGNOSIS — N401 Enlarged prostate with lower urinary tract symptoms: Secondary | ICD-10-CM | POA: Diagnosis not present

## 2021-10-08 DIAGNOSIS — M5416 Radiculopathy, lumbar region: Secondary | ICD-10-CM | POA: Insufficient documentation

## 2021-10-08 DIAGNOSIS — M5126 Other intervertebral disc displacement, lumbar region: Secondary | ICD-10-CM | POA: Insufficient documentation

## 2021-11-11 DIAGNOSIS — R351 Nocturia: Secondary | ICD-10-CM | POA: Diagnosis not present

## 2021-11-11 DIAGNOSIS — Z8546 Personal history of malignant neoplasm of prostate: Secondary | ICD-10-CM | POA: Diagnosis not present

## 2021-11-11 DIAGNOSIS — R8279 Other abnormal findings on microbiological examination of urine: Secondary | ICD-10-CM | POA: Diagnosis not present

## 2021-11-14 DIAGNOSIS — Z9889 Other specified postprocedural states: Secondary | ICD-10-CM | POA: Insufficient documentation

## 2021-11-20 DIAGNOSIS — Z9181 History of falling: Secondary | ICD-10-CM | POA: Diagnosis not present

## 2021-11-20 DIAGNOSIS — Z0000 Encounter for general adult medical examination without abnormal findings: Secondary | ICD-10-CM | POA: Diagnosis not present

## 2021-11-20 DIAGNOSIS — Z139 Encounter for screening, unspecified: Secondary | ICD-10-CM | POA: Diagnosis not present

## 2021-11-20 DIAGNOSIS — E785 Hyperlipidemia, unspecified: Secondary | ICD-10-CM | POA: Diagnosis not present

## 2021-11-20 DIAGNOSIS — Z1331 Encounter for screening for depression: Secondary | ICD-10-CM | POA: Diagnosis not present

## 2022-01-22 DIAGNOSIS — M069 Rheumatoid arthritis, unspecified: Secondary | ICD-10-CM | POA: Diagnosis not present

## 2022-01-22 DIAGNOSIS — I10 Essential (primary) hypertension: Secondary | ICD-10-CM | POA: Diagnosis not present

## 2022-01-22 DIAGNOSIS — L02212 Cutaneous abscess of back [any part, except buttock]: Secondary | ICD-10-CM | POA: Diagnosis not present

## 2022-02-05 DIAGNOSIS — M7918 Myalgia, other site: Secondary | ICD-10-CM | POA: Insufficient documentation

## 2022-03-25 DIAGNOSIS — T378X5A Adverse effect of other specified systemic anti-infectives and antiparasitics, initial encounter: Secondary | ICD-10-CM | POA: Diagnosis not present

## 2022-03-25 DIAGNOSIS — H25813 Combined forms of age-related cataract, bilateral: Secondary | ICD-10-CM | POA: Diagnosis not present

## 2022-03-25 DIAGNOSIS — Z79899 Other long term (current) drug therapy: Secondary | ICD-10-CM | POA: Diagnosis not present

## 2022-03-27 DIAGNOSIS — Z09 Encounter for follow-up examination after completed treatment for conditions other than malignant neoplasm: Secondary | ICD-10-CM | POA: Diagnosis not present

## 2022-03-27 DIAGNOSIS — K5730 Diverticulosis of large intestine without perforation or abscess without bleeding: Secondary | ICD-10-CM | POA: Diagnosis not present

## 2022-03-27 DIAGNOSIS — D124 Benign neoplasm of descending colon: Secondary | ICD-10-CM | POA: Diagnosis not present

## 2022-03-27 DIAGNOSIS — Z86010 Personal history of colonic polyps: Secondary | ICD-10-CM | POA: Diagnosis not present

## 2022-03-27 DIAGNOSIS — K6389 Other specified diseases of intestine: Secondary | ICD-10-CM | POA: Diagnosis not present

## 2022-03-27 DIAGNOSIS — K648 Other hemorrhoids: Secondary | ICD-10-CM | POA: Diagnosis not present

## 2022-03-31 DIAGNOSIS — D124 Benign neoplasm of descending colon: Secondary | ICD-10-CM | POA: Diagnosis not present

## 2022-05-02 DIAGNOSIS — Z8546 Personal history of malignant neoplasm of prostate: Secondary | ICD-10-CM | POA: Diagnosis not present

## 2022-05-09 DIAGNOSIS — C61 Malignant neoplasm of prostate: Secondary | ICD-10-CM | POA: Diagnosis not present

## 2022-05-09 DIAGNOSIS — R350 Frequency of micturition: Secondary | ICD-10-CM | POA: Diagnosis not present

## 2022-06-27 DIAGNOSIS — J309 Allergic rhinitis, unspecified: Secondary | ICD-10-CM | POA: Diagnosis not present

## 2022-06-27 DIAGNOSIS — I10 Essential (primary) hypertension: Secondary | ICD-10-CM | POA: Diagnosis not present

## 2022-06-27 DIAGNOSIS — R499 Unspecified voice and resonance disorder: Secondary | ICD-10-CM | POA: Diagnosis not present

## 2022-08-25 DIAGNOSIS — J019 Acute sinusitis, unspecified: Secondary | ICD-10-CM | POA: Diagnosis not present

## 2022-08-25 DIAGNOSIS — J209 Acute bronchitis, unspecified: Secondary | ICD-10-CM | POA: Diagnosis not present

## 2022-08-25 DIAGNOSIS — R6889 Other general symptoms and signs: Secondary | ICD-10-CM | POA: Diagnosis not present

## 2022-09-15 DIAGNOSIS — R6889 Other general symptoms and signs: Secondary | ICD-10-CM | POA: Diagnosis not present

## 2022-09-15 DIAGNOSIS — J329 Chronic sinusitis, unspecified: Secondary | ICD-10-CM | POA: Diagnosis not present

## 2022-09-19 ENCOUNTER — Other Ambulatory Visit: Payer: Self-pay | Admitting: Nurse Practitioner

## 2022-09-19 ENCOUNTER — Other Ambulatory Visit: Payer: Medicare HMO

## 2022-09-19 DIAGNOSIS — R29818 Other symptoms and signs involving the nervous system: Secondary | ICD-10-CM | POA: Diagnosis not present

## 2022-09-19 DIAGNOSIS — R2689 Other abnormalities of gait and mobility: Secondary | ICD-10-CM

## 2022-09-19 DIAGNOSIS — H539 Unspecified visual disturbance: Secondary | ICD-10-CM | POA: Diagnosis not present

## 2022-09-19 DIAGNOSIS — R269 Unspecified abnormalities of gait and mobility: Secondary | ICD-10-CM | POA: Diagnosis not present

## 2022-09-25 ENCOUNTER — Other Ambulatory Visit: Payer: Medicare HMO

## 2022-10-02 DIAGNOSIS — J0100 Acute maxillary sinusitis, unspecified: Secondary | ICD-10-CM | POA: Insufficient documentation

## 2022-10-02 DIAGNOSIS — R499 Unspecified voice and resonance disorder: Secondary | ICD-10-CM | POA: Diagnosis not present

## 2022-10-08 ENCOUNTER — Other Ambulatory Visit (INDEPENDENT_AMBULATORY_CARE_PROVIDER_SITE_OTHER): Payer: Medicare HMO

## 2022-10-08 ENCOUNTER — Ambulatory Visit: Payer: Medicare HMO | Admitting: Physician Assistant

## 2022-10-08 ENCOUNTER — Encounter: Payer: Self-pay | Admitting: Physician Assistant

## 2022-10-08 VITALS — BP 156/87 | HR 87 | Resp 20 | Ht 68.0 in | Wt 177.0 lb

## 2022-10-08 DIAGNOSIS — R413 Other amnesia: Secondary | ICD-10-CM

## 2022-10-08 LAB — VITAMIN B12: Vitamin B-12: 157 pg/mL — ABNORMAL LOW (ref 211–911)

## 2022-10-08 LAB — TSH: TSH: 0.89 u[IU]/mL (ref 0.35–5.50)

## 2022-10-08 NOTE — Progress Notes (Signed)
Thyroid is normal, B12 is very low, recommend to take B12 1000 mcg daily, follow with primary doctor

## 2022-10-08 NOTE — Patient Instructions (Addendum)
It was a pleasure to see you today at our office.   Recommendations:  Neurocognitive evaluation at our office MRI of the brain, the radiology office will call you to arrange you appointment Check labs today  Follow up in 1 month  Whom to call:  Memory  decline, memory medications: Call our office [phone]   For psychiatric meds, mood meds: Please have your primary care physician manage these medications.       For assessment of decision of mental capacity and competency:  Call Dr. Anthoney Harada, geriatric psychiatrist at [phone]  For guidance in geriatric dementia issues please call [facility] [phone]  For guidance regarding [facility] and if placement were needed at the facility, contact Arnell Asal, Social Worker tel: [phone]  If you have any severe symptoms of a stroke, or other severe issues such as confusion,severe chills or fever, etc call 911 or go to the ER as you may need to be evaluated further   Feel free to visit Facebook page " [email or web address]" for tips of how to care for people with memory problems.       RECOMMENDATIONS FOR ALL PATIENTS WITH MEMORY PROBLEMS: 1. Continue to exercise (Recommend 30 minutes of walking everyday, or 3 hours every week) 2. Increase social interactions - continue going to [place] and enjoy social gatherings with friends and family 3. Eat healthy, avoid fried foods and eat more fruits and vegetables 4. Maintain adequate blood pressure, blood sugar, and blood cholesterol level. Reducing the risk of stroke and cardiovascular disease also helps promoting better memory. 5. Avoid stressful situations. Live a simple life and avoid aggravations. Organize your time and prepare for the next day in anticipation. 6. Sleep well, avoid any interruptions of sleep and avoid any distractions in the bedroom that may interfere with adequate sleep quality 7. Avoid sugar, avoid sweets as there is a strong link  between excessive sugar intake, diabetes, and cognitive impairment We discussed the Mediterranean diet, which has been shown to help patients reduce the risk of progressive memory disorders and reduces cardiovascular risk. This includes eating fish, eat fruits and green leafy vegetables, nuts like almonds and hazelnuts, walnuts, and also use olive oil. Avoid fast foods and fried foods as much as possible. Avoid sweets and sugar as sugar use has been linked to worsening of memory function.  There is always a concern of gradual progression of memory problems. If this is the case, then we may need to adjust level of care according to patient needs. Support, both to the patient and caregiver, should then be put into place.      You have been referred for a neuropsychological evaluation (i.e., evaluation of memory and thinking abilities). Please bring someone with you to this appointment if possible, as it is helpful for the doctor to hear from both you and another adult who knows you well. Please bring eyeglasses and hearing aids if you wear them.    The evaluation will take approximately 3 hours and has two parts:   The first part is a clinical interview with the neuropsychologist (Dr. Melvyn Novas or Dr. Nicole Kindred). During the interview, the neuropsychologist will speak with you and the individual you brought to the appointment.    The second part of the evaluation is testing with the doctor's technician Hinton Dyer or Maudie Mercury). During the testing, the technician will ask you to remember different types of material, solve problems, and answer some questionnaires. Your family member will not be present  for this portion of the evaluation.   Please note: We must reserve several hours of the neuropsychologist's time and the psychometrician's time for your evaluation appointment. As such, there is a No-Show fee of $100. If you are unable to attend any of your appointments, please contact our office as soon as possible to  reschedule.    FALL PRECAUTIONS: Be cautious when walking. Scan the area for obstacles that may increase the risk of trips and falls. When getting up in the mornings, sit up at the edge of the bed for a few minutes before getting out of bed. Consider elevating the bed at the head end to avoid drop of blood pressure when getting up. Walk always in a well-lit room (use night lights in the walls). Avoid area rugs or power cords from appliances in the middle of the walkways. Use a walker or a cane if necessary and consider physical therapy for balance exercise. Get your eyesight checked regularly.  FINANCIAL OVERSIGHT: Supervision, especially oversight when making financial decisions or transactions is also recommended.  HOME SAFETY: Consider the safety of the kitchen when operating appliances like stoves, microwave oven, and blender. Consider having supervision and share cooking responsibilities until no longer able to participate in those. Accidents with firearms and other hazards in the house should be identified and addressed as well.   ABILITY TO BE LEFT ALONE: If patient is unable to contact 911 operator, consider using LifeLine, or when the need is there, arrange for someone to stay with patients. Smoking is a fire hazard, consider supervision or cessation. Risk of wandering should be assessed by caregiver and if detected at any point, supervision and safe proof recommendations should be instituted.  MEDICATION SUPERVISION: Inability to self-administer medication needs to be constantly addressed. Implement a mechanism to ensure safe administration of the medications.   DRIVING: Regarding driving, in patients with progressive memory problems, driving will be impaired. We advise to have someone else do the driving if trouble finding directions or if minor accidents are reported. Independent driving assessment is available to determine safety of driving.   If you are interested in the driving  assessment, you can contact the following:  [employer] in [place]  [place] [phone] or [phone]    [place]oyd refers to food and lifestyle choices that are based on the traditions of countries located on the [employer]. This way of eating has been shown to help prevent certain conditions and improve outcomes for people who have chronic diseases, like kidney disease and heart disease. What are tips for following this plan? Lifestyle  Cook and eat meals together with your family, when possible. Drink enough fluid to keep your urine clear or pale yellow. Be physically active every day. This includes: Aerobic exercise like running or swimming. Leisure activities like gardening, walking, or housework. Get 7-8 hours of sleep each night. If recommended by your health care provider, drink red wine in moderation. This means 1 glass a day for nonpregnant women and 2 glasses a day for men. A glass of wine equals 5 oz (150 mL). Reading food labels  Check the serving size of packaged foods. For foods such as rice and pasta, the serving size refers to the amount of cooked product, not dry. Check the total fat in packaged foods. Avoid foods that have saturated fat or trans fats. Check the ingredients list for added sugars, such  as corn syrup. Shopping  At the grocery store, buy most of your food from the areas near the walls of the store. This includes: Fresh fruits and vegetables (produce). Grains, beans, nuts, and seeds. Some of these may be available in unpackaged forms or large amounts (in bulk). Fresh seafood. Poultry and eggs. Low-fat dairy products. Buy whole ingredients instead of prepackaged foods. Buy fresh fruits and vegetables in-season from local farmers markets. Buy frozen fruits and vegetables in resealable bags. If  you do not have access to quality fresh seafood, buy precooked frozen shrimp or canned fish, such as tuna, salmon, or sardines. Buy small amounts of raw or cooked vegetables, salads, or olives from the deli or salad bar at your store. Stock your pantry so you always have certain foods on hand, such as olive oil, canned tuna, canned tomatoes, rice, pasta, and beans. Cooking  Cook foods with extra-virgin olive oil instead of using butter or other vegetable oils. Have meat as a side dish, and have vegetables or grains as your main dish. This means having meat in small portions or adding small amounts of meat to foods like pasta or stew. Use beans or vegetables instead of meat in common dishes like chili or lasagna. Experiment with different cooking methods. Try roasting or broiling vegetables instead of steaming or sauteing them. Add frozen vegetables to soups, stews, pasta, or rice. Add nuts or seeds for added healthy fat at each meal. You can add these to yogurt, salads, or vegetable dishes. Marinate fish or vegetables using olive oil, lemon juice, garlic, and fresh herbs. Meal planning  Plan to eat 1 vegetarian meal one day each week. Try to work up to 2 vegetarian meals, if possible. Eat seafood 2 or more times a week. Have healthy snacks readily available, such as: Vegetable sticks with hummus. Greek yogurt. Fruit and nut trail mix. Eat balanced meals throughout the week. This includes: Fruit: 2-3 servings a day Vegetables: 4-5 servings a day Low-fat dairy: 2 servings a day Fish, poultry, or lean meat: 1 serving a day Beans and legumes: 2 or more servings a week Nuts and seeds: 1-2 servings a day Whole grains: 6-8 servings a day Extra-virgin olive oil: 3-4 servings a day Limit red meat and sweets to only a few servings a month What are my food choices? Mediterranean diet Recommended Grains: Whole-grain pasta. Brown rice. Bulgar wheat. Polenta. Couscous. Whole-wheat bread. [place]. Vegetables: Artichokes. Beets. Broccoli. Cabbage. Carrots. Eggplant. Green beans. Chard. Kale. Spinach. Onions. Leeks. Peas. Squash. Tomatoes. Peppers. Radishes. Fruits: Apples. Apricots. Avocado. Berries. Bananas. Cherries. Dates. Figs. Grapes. Lemons. Melon. Oranges. Peaches. Plums. Pomegranate. Meats and other protein foods: Beans. Almonds. Sunflower seeds. Pine nuts. Peanuts. [place]. Salmon. Scallops. Shrimp. [place]. Tilapia. Clams. Oysters. Eggs. Dairy: Low-fat milk. Cheese. Greek yogurt. Beverages: Water. Red wine. Herbal tea. Fats and oils: Extra virgin olive oil. Avocado oil. Grape seed oil. Sweets and desserts: [place] yogurt with honey. Baked apples. Poached pears. Trail mix. Seasoning and other foods: Basil. Cilantro. Coriander. Cumin. Mint. Parsley. Sage. Rosemary. Tarragon. Garlic. Oregano. Thyme. Pepper. Balsalmic vinegar. Tahini. Hummus. Tomato sauce. Olives. Mushrooms. Limit these Grains: Prepackaged pasta or rice dishes. Prepackaged cereal with added sugar. Vegetables: Deep fried potatoes (french fries). Fruits: Fruit canned in syrup. Meats and other protein foods: Beef. Pork. Lamb. Poultry with skin. Hot dogs. Berniece Salines. Dairy: Ice cream. Sour cream. Whole milk. Beverages: Juice. Sugar-sweetened soft drinks. Beer. Liquor and spirits. Fats and oils: Butter. Canola oil. Vegetable oil. Beef fat (tallow).  Lard. Sweets and desserts: Cookies. Cakes. Pies. Candy. Seasoning and other foods: Mayonnaise. Premade sauces and marinades. The items listed may not be a complete list. Talk with your dietitian about what dietary choices are right for you. Summary The Mediterranean diet includes both food and lifestyle choices. Eat a variety of fresh fruits and vegetables, beans, nuts, seeds, and whole grains. Limit the amount of red meat and sweets that you eat. Talk with your health care provider about whether it is safe for you to drink red wine in moderation. This means 1 glass a day for  nonpregnant women and 2 glasses a day for men. A glass of wine equals 5 oz (150 mL). This information is not intended to replace advice given to you by your health care provider. Make sure you discuss any questions you have with your health care provider. Document Released: 04/17/2016 Document Revised: 05/20/2016 Document Reviewed: 04/17/2016 Elsevier Interactive Patient Education  2017 [place] [address]  [facility] at [place].

## 2022-10-08 NOTE — Progress Notes (Signed)
Assessment/Plan:   Charles Frost is a very pleasant 77 y.o. year old RH male with a history of hypertension, hyperlipidemia, rheumatoid arthritis, osteoarthritis with chronic pain and stiffness, GERD, OSA not on CPAP, PTSD followed by mental health ( fought in [place]), history of prostate cancer, cataracts , subjective peripheral neuropathy seen today for evaluation of memory difficulties. MoCA today is 25/30 . Workup is in progress    Memory Impairment  MRI brain without contrast to assess for underlying structural abnormality and assess vascular load  Neurocognitive testing to further evaluate cognitive concerns and determine other underlying cause of memory changes, including potential contribution from sleep, anxiety, or depression  Check B12, TSH, B6  Folllow up in  1 month  to discuss the MRI results   Subjective:   The patient is here alone   How long did patient have memory difficulties? For at least 4 years and worse since 04/2022. Patient has some difficulty remembering recent conversations after 1 day or 2. " People names are hard".  repeats oneself?  Endorsed Disoriented when walking into a room?  Patient denies except occasionally not remembering what patient came to the room for .  Leaving objects in unusual places? Endorsed, I left the  car keys in the freezer   Wandering behavior?  denies   Any personality changes since last visit? Endorsed, "more grouchy, nervous, I should shout".  Any worsening depression?: Endorsed Hallucinations or paranoia?  For the last 6 months , sometimes he sees people that he does not know, "I never see the faces and if I turn my head is gone". "I see objects moving, but that is for a long time, but for  the last week I have been ok ". Denies paranoia. Seizures?   Patient denies    Any sleep changes?  Endorsed. He reports vivid dreams, Possible REM behavior, talks in his sleep," I dream I am falling", denies   sleepwalking   Sleep apnea?   Endorsed, but could not figure how to make it work, he sent the machine back and waiting for new equipment Any hygiene concerns?  Patient denies   Independent of bathing and dressing?  Endorsed  Does the patient needs help with medications? Patient is in charge   Who is in charge of the finances?  Patient is in charge . Sometimes he forgets to pay bills, worse over the last 6 months     Any changes in appetite?   I don't eat more than breakfast , not that hungry, takes Ensure  Patient have trouble swallowing?  Occasionally because of GERD Does the patient cook?  Denies    Any headaches? " I have a history of migraines ", not recently Chronic back pain  Endorsed, due to arthritis  Ambulates with difficulty?    denies   Recent falls or head injuries?   denies     Unilateral weakness, numbness or tingling?  The patient has a history of rheumatoid arthritis, and chronic pain, joint pain, and stiffness peripheral neuropathy; tried a cane,  but does not use it as much after the back surgery " the back surgery worked!"-he says .  any tremors?  Only when nervous   Any anosmia?  "At times, when I have a sinus infection" . "Sometimes I have some vertigo due to sinus"  Any incontinence of urine? Endorsed, uses Depends  Any bowel dysfunction? "At times diarrhea , not frequent" Patient lives  with wife.  History of heavy alcohol  intake? denies   History of heavy tobacco use?  denies   Family history of dementia?   Mother had dementia, likely Alzheimer's  Does patient drive? Endorsed, need the GPS to prevent getting lost  Retired Dealer, he has a Designer, jewellery in [employer]  Past Medical History:  Diagnosis Date   GERD (gastroesophageal reflux disease)    Hypercholesterolemia    Hypertension    Prostate cancer ([place])    Rheumatoid arthritis ([place])    RA     Past Surgical History:  Procedure Laterality Date   cysto uretero remove stone  2009   cystoscopy insert stent  2009   PROSTATE BIOPSY      ulnar nerve release Right 2008     No Known Allergies  Current Outpatient Medications  Medication Instructions   folic acid (FOLVITE) 1 mg, Oral, Daily   Meth-Hyo-M Bl-Na Phos-Ph Sal (URO-MP) 118 MG CAPS 1 tablet, Oral, 3 times daily   mirabegron ER (MYRBETRIQ) 50 mg, Oral, Daily   opium-belladonna (B&O SUPPRETTES) 16.2-60 MG suppository 1 suppository, Rectal, Every 6 hours PRN   oxybutynin (DITROPAN) 5 mg, Oral, 3 times daily   phenazopyridine (PYRIDIUM) 200 mg, Oral, 3 times daily   tamsulosin (FLOMAX) 0.8 mg, Oral, Daily after supper     VITALS:   Vitals:   10/08/22 0953  BP: (!) 156/87  Pulse: 87  Resp: 20  SpO2: 97%  Weight: 177 lb (80.3 kg)  Height: '5\' 8"'$  (1.727 m)    PHYSICAL EXAM   HEENT:  Normocephalic, atraumatic. The mucous membranes are moist. The superficial temporal arteries are without ropiness or tenderness. Cardiovascular: Regular rate and rhythm. Lungs: Clear to auscultation bilaterally. Neck: There are no carotid bruits noted bilaterally.  NEUROLOGICAL:    10/08/2022    4:00 PM  Montreal Cognitive Assessment   Visuospatial/ Executive (0/5) 4  Naming (0/3) 3  Attention: Read list of digits (0/2) 2  Attention: Read list of letters (0/1) 1  Attention: Serial 7 subtraction starting at 100 (0/3) 3  Language: Repeat phrase (0/2) 1  Language : Fluency (0/1) 1  Abstraction (0/2) 1  Delayed Recall (0/5) 3  Orientation (0/6) 6  Total 25  Adjusted Score (based on education) 25        No data to display           Orientation:  Alert and oriented to person, place and time. No aphasia or dysarthria. Fund of knowledge is appropriate. Recent memory impaired and remote memory intact.  Attention and concentration are normal.  Able to name objects and repeat phrases. Delayed recall   3/5 Cranial nerves: There is good facial symmetry. Extraocular muscles are intact and visual fields are full to confrontational testing. Speech is fluent and clear. No tongue  deviation. Hearing is intact to conversational tone. Tone: Tone is good throughout. Sensation: Sensation is intact to light touch and pinprick throughout. Vibration is intact at the bilateral big toe.There is no extinction with double simultaneous stimulation. There is no sensory dermatomal level identified. Coordination: The patient has no difficulty with RAM's or FNF bilaterally. Normal finger to nose  Motor: Strength is 5/5 in the bilateral upper and lower extremities. There is no pronator drift. There are no fasciculations noted. DTR's: Deep tendon reflexes are 2/4 at the bilateral biceps, triceps, brachioradialis, patella and achilles.  Plantar responses are downgoing bilaterally. Gait and Station: The patient is able to ambulate without difficulty.The patient is able to heel toe walk without any difficulty.The patient is able  to ambulate in a tandem fashion. The patient is able to stand in the Romberg position.     Thank you for allowing [place] the opportunity to participate in the care of this nice patient. Please do not hesitate to contact us for any questions or concerns.   Total time spent on today's visit was 25 minutes dedicated to this patient today, preparing to see patient, examining the patient, ordering tests and/or medications and counseling the patient, documenting clinical information in the EHR or other health record, independently interpreting results and communicating results to the patient/family, discussing treatment and goals, answering patient's questions and coordinating care.  Cc:  Herbert Deaner, FNP  Sharene Butters 10/08/2022 4:49 PM

## 2022-10-09 ENCOUNTER — Telehealth: Payer: Self-pay | Admitting: Physician Assistant

## 2022-10-09 NOTE — Telephone Encounter (Signed)
Pt called in and left a message returning our call about results

## 2022-10-12 LAB — VITAMIN B6: Vitamin B6: 6.2 ng/mL (ref 2.1–21.7)

## 2022-10-17 DIAGNOSIS — J342 Deviated nasal septum: Secondary | ICD-10-CM | POA: Diagnosis not present

## 2022-10-17 DIAGNOSIS — J0100 Acute maxillary sinusitis, unspecified: Secondary | ICD-10-CM | POA: Diagnosis not present

## 2022-10-17 DIAGNOSIS — J323 Chronic sphenoidal sinusitis: Secondary | ICD-10-CM | POA: Diagnosis not present

## 2022-10-30 ENCOUNTER — Other Ambulatory Visit: Payer: Medicare HMO

## 2022-11-06 DIAGNOSIS — C61 Malignant neoplasm of prostate: Secondary | ICD-10-CM | POA: Diagnosis not present

## 2022-11-07 ENCOUNTER — Ambulatory Visit: Payer: Medicare HMO | Admitting: Physician Assistant

## 2022-11-13 DIAGNOSIS — C61 Malignant neoplasm of prostate: Secondary | ICD-10-CM | POA: Diagnosis not present

## 2022-11-13 DIAGNOSIS — N401 Enlarged prostate with lower urinary tract symptoms: Secondary | ICD-10-CM | POA: Diagnosis not present

## 2022-11-13 DIAGNOSIS — R350 Frequency of micturition: Secondary | ICD-10-CM | POA: Diagnosis not present

## 2023-02-19 DIAGNOSIS — M069 Rheumatoid arthritis, unspecified: Secondary | ICD-10-CM | POA: Diagnosis not present

## 2023-02-19 DIAGNOSIS — R238 Other skin changes: Secondary | ICD-10-CM | POA: Diagnosis not present

## 2023-02-19 DIAGNOSIS — L910 Hypertrophic scar: Secondary | ICD-10-CM | POA: Diagnosis not present

## 2023-03-06 DIAGNOSIS — L02212 Cutaneous abscess of back [any part, except buttock]: Secondary | ICD-10-CM | POA: Diagnosis not present

## 2023-03-13 DIAGNOSIS — Z139 Encounter for screening, unspecified: Secondary | ICD-10-CM | POA: Diagnosis not present

## 2023-03-13 DIAGNOSIS — L910 Hypertrophic scar: Secondary | ICD-10-CM | POA: Diagnosis not present

## 2023-03-13 DIAGNOSIS — Z1331 Encounter for screening for depression: Secondary | ICD-10-CM | POA: Diagnosis not present

## 2023-03-13 DIAGNOSIS — Z8614 Personal history of Methicillin resistant Staphylococcus aureus infection: Secondary | ICD-10-CM | POA: Diagnosis not present

## 2023-03-13 DIAGNOSIS — I10 Essential (primary) hypertension: Secondary | ICD-10-CM | POA: Diagnosis not present

## 2023-04-22 DIAGNOSIS — L910 Hypertrophic scar: Secondary | ICD-10-CM | POA: Diagnosis not present

## 2023-07-16 DIAGNOSIS — Z1331 Encounter for screening for depression: Secondary | ICD-10-CM | POA: Diagnosis not present

## 2023-07-16 DIAGNOSIS — Z0000 Encounter for general adult medical examination without abnormal findings: Secondary | ICD-10-CM | POA: Diagnosis not present

## 2023-07-16 DIAGNOSIS — Z9181 History of falling: Secondary | ICD-10-CM | POA: Diagnosis not present

## 2023-07-16 DIAGNOSIS — Z139 Encounter for screening, unspecified: Secondary | ICD-10-CM | POA: Diagnosis not present

## 2023-07-17 ENCOUNTER — Ambulatory Visit: Payer: Self-pay

## 2023-07-17 ENCOUNTER — Institutional Professional Consult (permissible substitution): Payer: Medicare HMO | Admitting: Psychology

## 2023-07-29 DIAGNOSIS — L910 Hypertrophic scar: Secondary | ICD-10-CM | POA: Diagnosis not present

## 2023-08-19 DIAGNOSIS — L910 Hypertrophic scar: Secondary | ICD-10-CM | POA: Diagnosis not present

## 2023-08-20 DIAGNOSIS — J019 Acute sinusitis, unspecified: Secondary | ICD-10-CM | POA: Diagnosis not present

## 2023-09-07 ENCOUNTER — Emergency Department (HOSPITAL_COMMUNITY): Payer: Medicare HMO

## 2023-09-07 ENCOUNTER — Inpatient Hospital Stay (HOSPITAL_COMMUNITY)
Admission: EM | Admit: 2023-09-07 | Discharge: 2023-09-09 | DRG: 061 | Disposition: A | Discharge status: Rehabilitation Facility | Payer: Medicare HMO | Attending: Neurology | Admitting: Neurology

## 2023-09-07 ENCOUNTER — Inpatient Hospital Stay (HOSPITAL_COMMUNITY): Payer: Medicare HMO

## 2023-09-07 ENCOUNTER — Other Ambulatory Visit: Payer: Self-pay

## 2023-09-07 ENCOUNTER — Encounter (HOSPITAL_COMMUNITY): Payer: Self-pay | Admitting: *Deleted

## 2023-09-07 DIAGNOSIS — I63532 Cerebral infarction due to unspecified occlusion or stenosis of left posterior cerebral artery: Secondary | ICD-10-CM

## 2023-09-07 DIAGNOSIS — I160 Hypertensive urgency: Secondary | ICD-10-CM | POA: Diagnosis not present

## 2023-09-07 DIAGNOSIS — Z87891 Personal history of nicotine dependence: Secondary | ICD-10-CM

## 2023-09-07 DIAGNOSIS — H53461 Homonymous bilateral field defects, right side: Secondary | ICD-10-CM | POA: Diagnosis present

## 2023-09-07 DIAGNOSIS — R29706 NIHSS score 6: Secondary | ICD-10-CM | POA: Diagnosis present

## 2023-09-07 DIAGNOSIS — R531 Weakness: Secondary | ICD-10-CM | POA: Diagnosis not present

## 2023-09-07 DIAGNOSIS — I110 Hypertensive heart disease with heart failure: Secondary | ICD-10-CM | POA: Diagnosis present

## 2023-09-07 DIAGNOSIS — Z8546 Personal history of malignant neoplasm of prostate: Secondary | ICD-10-CM

## 2023-09-07 DIAGNOSIS — R471 Dysarthria and anarthria: Secondary | ICD-10-CM | POA: Diagnosis present

## 2023-09-07 DIAGNOSIS — I6389 Other cerebral infarction: Secondary | ICD-10-CM

## 2023-09-07 DIAGNOSIS — G8191 Hemiplegia, unspecified affecting right dominant side: Secondary | ICD-10-CM | POA: Diagnosis present

## 2023-09-07 DIAGNOSIS — Z79899 Other long term (current) drug therapy: Secondary | ICD-10-CM | POA: Diagnosis not present

## 2023-09-07 DIAGNOSIS — I639 Cerebral infarction, unspecified: Principal | ICD-10-CM

## 2023-09-07 DIAGNOSIS — E119 Type 2 diabetes mellitus without complications: Secondary | ICD-10-CM | POA: Diagnosis present

## 2023-09-07 DIAGNOSIS — R4781 Slurred speech: Secondary | ICD-10-CM | POA: Diagnosis not present

## 2023-09-07 DIAGNOSIS — R4701 Aphasia: Secondary | ICD-10-CM | POA: Diagnosis present

## 2023-09-07 DIAGNOSIS — E7800 Pure hypercholesterolemia, unspecified: Secondary | ICD-10-CM | POA: Diagnosis present

## 2023-09-07 DIAGNOSIS — R29818 Other symptoms and signs involving the nervous system: Secondary | ICD-10-CM | POA: Diagnosis not present

## 2023-09-07 DIAGNOSIS — Z7982 Long term (current) use of aspirin: Secondary | ICD-10-CM | POA: Diagnosis not present

## 2023-09-07 DIAGNOSIS — I6349 Cerebral infarction due to embolism of other cerebral artery: Principal | ICD-10-CM | POA: Diagnosis present

## 2023-09-07 DIAGNOSIS — Z923 Personal history of irradiation: Secondary | ICD-10-CM

## 2023-09-07 DIAGNOSIS — I5021 Acute systolic (congestive) heart failure: Secondary | ICD-10-CM | POA: Diagnosis present

## 2023-09-07 DIAGNOSIS — K219 Gastro-esophageal reflux disease without esophagitis: Secondary | ICD-10-CM | POA: Diagnosis present

## 2023-09-07 DIAGNOSIS — I10 Essential (primary) hypertension: Secondary | ICD-10-CM | POA: Diagnosis not present

## 2023-09-07 DIAGNOSIS — M069 Rheumatoid arthritis, unspecified: Secondary | ICD-10-CM | POA: Diagnosis not present

## 2023-09-07 DIAGNOSIS — D689 Coagulation defect, unspecified: Secondary | ICD-10-CM | POA: Diagnosis present

## 2023-09-07 DIAGNOSIS — G936 Cerebral edema: Secondary | ICD-10-CM | POA: Diagnosis not present

## 2023-09-07 DIAGNOSIS — Z9079 Acquired absence of other genital organ(s): Secondary | ICD-10-CM

## 2023-09-07 DIAGNOSIS — I6782 Cerebral ischemia: Secondary | ICD-10-CM | POA: Diagnosis not present

## 2023-09-07 DIAGNOSIS — I700 Atherosclerosis of aorta: Secondary | ICD-10-CM | POA: Diagnosis not present

## 2023-09-07 LAB — CBC
HCT: 43.7 % (ref 39.0–52.0)
Hemoglobin: 13.7 g/dL (ref 13.0–17.0)
MCH: 27 pg (ref 26.0–34.0)
MCHC: 31.4 g/dL (ref 30.0–36.0)
MCV: 86 fL (ref 80.0–100.0)
Platelets: 223 10*3/uL (ref 150–400)
RBC: 5.08 MIL/uL (ref 4.22–5.81)
RDW: 15 % (ref 11.5–15.5)
WBC: 5.7 10*3/uL (ref 4.0–10.5)
nRBC: 0 % (ref 0.0–0.2)

## 2023-09-07 LAB — DIFFERENTIAL
Abs Immature Granulocytes: 0.01 10*3/uL (ref 0.00–0.07)
Basophils Absolute: 0.1 10*3/uL (ref 0.0–0.1)
Basophils Relative: 1 %
Eosinophils Absolute: 0.2 10*3/uL (ref 0.0–0.5)
Eosinophils Relative: 3 %
Immature Granulocytes: 0 %
Lymphocytes Relative: 23 %
Lymphs Abs: 1.3 10*3/uL (ref 0.7–4.0)
Monocytes Absolute: 0.5 10*3/uL (ref 0.1–1.0)
Monocytes Relative: 8 %
Neutro Abs: 3.7 10*3/uL (ref 1.7–7.7)
Neutrophils Relative %: 65 %

## 2023-09-07 LAB — ETHANOL: Alcohol, Ethyl (B): <10 mg/dL (ref <10)

## 2023-09-07 LAB — COMPREHENSIVE METABOLIC PANEL
ALT: 17 U/L (ref 0–44)
AST: 22 U/L (ref 15–41)
Albumin: 3.8 g/dL (ref 3.5–5.0)
Alkaline Phosphatase: 50 U/L (ref 38–126)
Anion gap: 10 (ref 5–15)
BUN: 16 mg/dL (ref 8–23)
CO2: 23 mmol/L (ref 22–32)
Calcium: 9.8 mg/dL (ref 8.9–10.3)
Chloride: 105 mmol/L (ref 98–111)
Creatinine, Ser: 1.08 mg/dL (ref 0.61–1.24)
GFR, Estimated: >60 mL/min (ref >60)
Glucose, Bld: 110 mg/dL — ABNORMAL HIGH (ref 70–99)
Potassium: 4.4 mmol/L (ref 3.5–5.1)
Sodium: 138 mmol/L (ref 135–145)
Total Bilirubin: 1 mg/dL (ref 0.0–1.2)
Total Protein: 7.2 g/dL (ref 6.5–8.1)

## 2023-09-07 LAB — I-STAT CHEM 8, ED
BUN: 22 mg/dL (ref 8–23)
Calcium, Ion: 0.99 mmol/L — ABNORMAL LOW (ref 1.15–1.40)
Chloride: 106 mmol/L (ref 98–111)
Creatinine, Ser: 1 mg/dL (ref 0.61–1.24)
Glucose, Bld: 109 mg/dL — ABNORMAL HIGH (ref 70–99)
HCT: 45 % (ref 39.0–52.0)
Hemoglobin: 15.3 g/dL (ref 13.0–17.0)
Potassium: 4.5 mmol/L (ref 3.5–5.1)
Sodium: 140 mmol/L (ref 135–145)
TCO2: 28 mmol/L (ref 22–32)

## 2023-09-07 LAB — PROTIME-INR
INR: 1 (ref 0.8–1.2)
Prothrombin Time: 13.2 s (ref 11.4–15.2)

## 2023-09-07 LAB — ECHOCARDIOGRAM COMPLETE
Area-P 1/2: 4.39 cm2
Calc EF: 34.5 %
Height: 68 in
S' Lateral: 3.6 cm
Single Plane A2C EF: 35.4 %
Single Plane A4C EF: 32.3 %
Weight: 2836 [oz_av]

## 2023-09-07 LAB — APTT: aPTT: 25 s (ref 24–36)

## 2023-09-07 LAB — CBG MONITORING, ED: Glucose-Capillary: 109 mg/dL — ABNORMAL HIGH (ref 70–99)

## 2023-09-07 LAB — MRSA NEXT GEN BY PCR, NASAL: MRSA by PCR Next Gen: NOT DETECTED

## 2023-09-07 MED ORDER — ACETAMINOPHEN 650 MG RE SUPP: 650.0000 mg | RECTAL | Status: DC | PRN

## 2023-09-07 MED ORDER — TENECTEPLASE FOR STROKE
0.2500 mg/kg | PACK | Freq: Once | INTRAVENOUS | Status: AC
  Administered 2023-09-07: 20 mg via INTRAVENOUS
  Filled 2023-09-07: qty 10

## 2023-09-07 MED ORDER — TAMSULOSIN HCL 0.4 MG PO CAPS
0.8000 mg | ORAL_CAPSULE | Freq: Every day | ORAL | Status: DC
Start: 2023-09-07 — End: 2023-09-09
  Administered 2023-09-07 – 2023-09-08 (×2): 0.8 mg via ORAL
  Filled 2023-09-07 (×2): qty 2

## 2023-09-07 MED ORDER — ACETAMINOPHEN 325 MG PO TABS
650.0000 mg | ORAL_TABLET | ORAL | Status: DC | PRN
  Administered 2023-09-07 – 2023-09-08 (×4): 650 mg via ORAL
  Filled 2023-09-07 (×4): qty 2

## 2023-09-07 MED ORDER — IOHEXOL 350 MG/ML SOLN
75.0000 mL | Freq: Once | INTRAVENOUS | Status: AC | PRN
  Administered 2023-09-07: 75 mL via INTRAVENOUS

## 2023-09-07 MED ORDER — PERFLUTREN LIPID MICROSPHERE
1.0000 mL | INTRAVENOUS | Status: AC | PRN
  Administered 2023-09-07: 2 mL via INTRAVENOUS

## 2023-09-07 MED ORDER — PANTOPRAZOLE SODIUM 40 MG IV SOLR
40.0000 mg | Freq: Every day | INTRAVENOUS | Status: DC
  Administered 2023-09-07 – 2023-09-08 (×2): 40 mg via INTRAVENOUS
  Filled 2023-09-07 (×2): qty 10

## 2023-09-07 MED ORDER — SENNOSIDES-DOCUSATE SODIUM 8.6-50 MG PO TABS: 1.0000 | ORAL_TABLET | Freq: Every evening | ORAL | Status: DC | PRN

## 2023-09-07 MED ORDER — SODIUM CHLORIDE 0.9% FLUSH: 3.0000 mL | Freq: Once | INTRAVENOUS | Status: DC

## 2023-09-07 MED ORDER — FOLIC ACID 1 MG PO TABS
1.0000 mg | ORAL_TABLET | Freq: Every day | ORAL | Status: DC
  Administered 2023-09-07 – 2023-09-09 (×3): 1 mg via ORAL
  Filled 2023-09-07 (×3): qty 1

## 2023-09-07 MED ORDER — LABETALOL HCL 5 MG/ML IV SOLN
10.0000 mg | Freq: Once | INTRAVENOUS | Status: AC
  Administered 2023-09-07: 10 mg via INTRAVENOUS

## 2023-09-07 MED ORDER — MIRABEGRON ER 50 MG PO TB24: 50.0000 mg | ORAL_TABLET | Freq: Every day | ORAL | Status: DC

## 2023-09-07 MED ORDER — ACETAMINOPHEN 160 MG/5ML PO SOLN: 650.0000 mg | ORAL | Status: DC | PRN

## 2023-09-07 MED ORDER — SODIUM CHLORIDE 0.9 % IV SOLN: INTRAVENOUS | Status: DC

## 2023-09-07 MED ORDER — STROKE: EARLY STAGES OF RECOVERY BOOK
Freq: Once | Status: AC
  Administered 2023-09-08: 1
  Filled 2023-09-07: qty 1

## 2023-09-07 MED ORDER — CLEVIDIPINE BUTYRATE 0.5 MG/ML IV EMUL
0.0000 mg/h | INTRAVENOUS | Status: DC
  Administered 2023-09-07: 2 mg/h via INTRAVENOUS

## 2023-09-07 NOTE — Plan of Care (Signed)

## 2023-09-07 NOTE — Progress Notes (Signed)
  Echocardiogram 2D Echocardiogram has been performed.  Charles Frost 09/07/2023, 2:51 PM
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
## 2023-09-07 NOTE — Progress Notes (Signed)
PHARMACIST CODE STROKE RESPONSE  Notified to mix TNK at 1144 by Dr. Marchelle Folks TNK preparation completed at 1146  TNK dose = 20 mg IV over 5 seconds  Issues/delays encountered (if applicable): Labetolol needed for BP control  Charles Frost 09/07/23 11:37 AM

## 2023-09-07 NOTE — ED Notes (Signed)
MD Derry Lory at bedside

## 2023-09-07 NOTE — ED Notes (Signed)
Wife and son at bedside.

## 2023-09-07 NOTE — Code Documentation (Addendum)
Charles Frost is a 77 year old male with a PMH of HTN and prostate cancer arriving to [facility] via [facility] on 09/07/2023. Pt is coming from a cafe where he was noted to be weak and having difficulty speaking. LKW initially thought to be 1020. Pt arrived as a preactivated code stroke alert. He is on no thinners.    Pt met at bridge by stroke team. Airway cleared by EDP, Labs, and CBG obtained. Pt to CT with team. NIHSS 6. Pt is disoriented, has bilateral leg drift, rt lower quadrantanopia and dysarthria. The following imaging was obtained: CT, MRI, CTA. Per Dr. Derry Lory, CT is negative for hemorrhage. 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 Last known well time clarified with wife to be 0915. TNK given at 1147 after BP lowered with 10 mg labetelol. Pt then back to CT for CTA. Per Dr. Derry Lory, CTA is negative for LVO. Pt back to ED room 18 where his workup will continue. He will need q 15 min VS and NIHSS for 2 hrs, q 30 min for 6 hours, then q 1 hour for 16 hours. He will need to be NPO until he passes a stroke swallow screen. Bedside handoff with ED RN Dahlia Client.

## 2023-09-07 NOTE — Consult Note (Addendum)
NEUROLOGY CONSULT NOTE   Date of service: September 07, 2023 Patient Name: Charles Frost MRN:  [id number] DOB:  1946-05-13 Chief Complaint: "CODE STROKE" Requesting Provider: Rolan Bucco, MD  History of Present Illness  Charles Frost is a 78 y.o. male with a PMH significant for HTN, RA, Prostate CA, GERD who presents as a CODE STROKE due to R-sided weakness, slurred speech and aphasia. Patient was at a cafe and suddenly became aphasic with right sided weakness. EMS was called and patient's weakness became more generalized. Patient was hypertensive with SBP over 200 en route.  On exam at bridge, patient was able to give history, oriented to month, confused to age and year, was dysarthric, slight drift BLE, decreased right lower field of vision, reduced sensation to left arm.  He was taken for STAT CTH which showed  Labetalol x1 was given due to hypertension.  Due to concern for PRES, STAT MRI was ordered. This showed a small hyperacute left occipital infarct.  Management with thrombolytic therapy was explained to the patient, and patient's son and wife thoroughly, as were risks, benefits and alternatives. All questions were answered. Patient, patient's wife and son expressed understanding of the treatment plan and agreed to proceed with thrombolytic treatment. TNK given @ 1147.  Post-TNK, patient was taken for STAT CTA, which showed no LVO. Neurological exam remained the same, with NIH at 6.   LKW: 0915 Modified rankin score: 0-Completely asymptomatic and back to baseline post- stroke IV Thrombolysis: Yes, 1147  Delay in giving TNK given differential for headache and visual field deficit included PRES vs acute stroke. Concern that administrating tnkase in the setting of PRES would be high risk. We therefore opted for STAT MRI Brain which was negative for PRES on T2/FLAIR imaging. DWI demonstrating tiny acute L occipital lobe stroke and tnkase was offered. There was further delay as blood  pressure had to be lowered prior to tnkase administration. Thorough discussion with patient and wife and son about mode of administration of tnkase along with risks and benefits of tnkase and alternatives. All questions were answered. They understand the risk of ICH and death. Given the disabling nature of vision loss, consented to tnkase administration.  NIHSS components Score: Comment  1a Level of Conscious 0[x]  1[]  2[]  3[]      1b LOC Questions 0[]  1[x]  2[]       1c LOC Commands 0[x]  1[]  2[]       2 Best Gaze 0[x]  1[]  2[]       3 Visual 0[]  1[x]  2[]  3[]      4 Facial Palsy 0[x]  1[]  2[]  3[]      5a Motor Arm - left 0[x]  1[]  2[]  3[]  4[]  UN[]    5b Motor Arm - Right 0[x]  1[]  2[]  3[]  4[]  UN[]    6a Motor Leg - Left 0[]  1[x]  2[]  3[]  4[]  UN[]    6b Motor Leg - Right 0[]  1[x]  2[]  3[]  4[]  UN[]    7 Limb Ataxia 0[x]  1[]  2[]  3[]  UN[]     8 Sensory 0[]  1[x]  2[]  UN[]      9 Best Language 0[x]  1[]  2[]  3[]      10 Dysarthria 0[]  1[x]  2[]  UN[]      11 Extinct. and Inattention 0[x]  1[]  2[]       TOTAL:   6      ROS  Comprehensive ROS performed and pertinent positives documented in HPI   Past History   Past Medical History:  Diagnosis Date   GERD (gastroesophageal reflux disease)    Hypercholesterolemia  Hypertension    Prostate cancer (HCC)    Rheumatoid arthritis (HCC)    RA    Past Surgical History:  Procedure Laterality Date   cysto uretero remove stone  2009   cystoscopy insert stent  2009   PROSTATE BIOPSY     ulnar nerve release Right 2008    Family History: Family History  Problem Relation Age of Onset   Cancer Maternal Aunt    Cancer Paternal Grandfather    Dementia Mother     Social History  reports that he quit smoking about 23 years ago. His smoking use included cigarettes. He started smoking about 53 years ago. He has a 7.5 pack-year smoking history. He has never used smokeless tobacco. He reports that he does not drink alcohol and does not use drugs.  No Known  Allergies  Medications   Current Facility-Administered Medications:    sodium chloride flush (NS) 0.9 % injection 3 mL, 3 mL, Intravenous, Once, Belfi, Melanie, MD  Current Outpatient Medications:    folic acid (FOLVITE) 1 MG tablet, Take 1 mg by mouth daily., Disp: , Rfl:    Meth-Hyo-M Bl-Na Phos-Ph Sal (URO-MP) 118 MG CAPS, Take 1 tablet by mouth 3 (three) times daily., Disp: , Rfl:    mirabegron ER (MYRBETRIQ) 50 MG TB24 tablet, Take 1 tablet (50 mg total) by mouth daily., Disp: 30 tablet, Rfl: 0   opium-belladonna (B&O SUPPRETTES) 16.2-60 MG suppository, Place 1 suppository rectally every 6 (six) hours as needed for bladder spasms., Disp: 60 suppository, Rfl: 0   oxybutynin (DITROPAN) 5 MG tablet, Take 1 tablet (5 mg total) by mouth 3 (three) times daily. (Patient not taking: Reported on 10/08/2022), Disp: 90 tablet, Rfl: 0   phenazopyridine (PYRIDIUM) 200 MG tablet, Take 1 tablet (200 mg total) by mouth 3 (three) times daily., Disp: 10 tablet, Rfl: 0   tamsulosin (FLOMAX) 0.4 MG CAPS capsule, Take 2 capsules (0.8 mg total) by mouth daily after supper., Disp: 30 capsule, Rfl: 2  Vitals   Vitals:   September 17, 2023 1100  Weight: 80.4 kg    Body mass index is 26.95 kg/m.  Physical Exam   Constitutional: Appears well-developed and well-nourished.  Psych: Affect appropriate to situation.  Eyes: No scleral injection.  HENT: No OP obstruction.  Head: Normocephalic.  Cardiovascular: Normal rate and regular rhythm.  Respiratory: Effort normal, non-labored breathing.  GI: Soft.  No distension. There is no tenderness.  Skin: WDI.   Neurologic Examination   Neuro: Mental Status: Patient is awake, alert, oriented to person, month, and situation. Disoriented to month and age. Patient is able to give a clear and coherent history. No signs of aphasia or neglect. Dysarthria present.  Cranial Nerves: II: Decreased right lower field of vision. Pupils are equal, round, and reactive to light.    III,IV, VI: EOMI without ptosis or diploplia.  V: Facial sensation is symmetric to light touch.  VII: Facial movement is symmetric.  VIII: hearing is intact to voice X: Uvula elevates symmetrically XI: Shoulder shrug is symmetric. XII: tongue is midline without atrophy or fasciculations.  Motor: Tone is normal. Bulk is normal.  BUE: 5/5, no drift BLE; 4+/5, slight drift Sensory: Reduced sensation to left arm.  Cerebellar: FNF and HKS are intact bilaterally   Labs/Imaging/Neurodiagnostic studies   CBC:  Recent Labs  Lab 09-17-23 1056 09-17-23 1101  WBC 5.7  --   NEUTROABS 3.7  --   HGB 13.7 15.3  HCT 43.7 45.0  MCV 86.0  --  PLT 223  --    Basic Metabolic Panel:  Lab Results  Component Value Date   NA 140 09/07/2023   K 4.5 09/07/2023   CO2 24 12/08/2016   GLUCOSE 109 (H) 09/07/2023   BUN 22 09/07/2023   CREATININE 1.00 09/07/2023   CALCIUM 9.5 12/08/2016   GFRNONAA >60 12/08/2016   GFRAA >60 12/08/2016   Lipid Panel: No results found for: "LDLCALC" HgbA1c:  Lab Results  Component Value Date   HGBA1C 6.3 (H) 12/08/2016   Urine Drug Screen: No results found for: "LABOPIA", "COCAINSCRNUR", "LABBENZ", "AMPHETMU", "THCU", "LABBARB"  Alcohol Level No results found for: "ETH" INR No results found for: "INR" APTT No results found for: "APTT" AED levels: No results found for: "PHENYTOIN", "ZONISAMIDE", "LAMOTRIGINE", "LEVETIRACETA"  CT Head without contrast(Personally reviewed): No evidence of acute infarct or hemorrhage. ' ASPECTS 10 Remote right PCA territory and remote right cerebellar infarcts with advanced chronic microvascular ischemic change.   MRI Brain(Personally reviewed): Small hyperacute infarct left occipital lobe Remote right occipital and bilateral cerebellar infarcts Moderate to severe chronic microvascular ischemic disease   ASSESSMENT   Charles Frost is a 77 y.o. male with a PMH significant for HTN, RA, Prostate CA, GERD who presents  as a CODE STROKE due to R-sided weakness, slurred speech and aphasia. Patient was at a cafe and suddenly became aphasic with right sided weakness. EMS was called and patient's weakness became more generalized. Patient was hypertensive with SBP over 200 en route.  On exam at bridge, patient was able to give history, oriented to month, confused to age and year, was dysarthric, slight drift BLE, decreased right lower field of vision along with L hemianopsia, reduced sensation to left arm.  MRI showed small acute left occpital infarct. Patient was given TNK @ 1147.  CTA showed no LVO.   RECOMMENDATIONS  - Frequent Neuro checks per stroke unit protocol - MRI 24hour post-TNK  - TTE - Carotid Dopplers, TCD with Bubble Study if indicated - Lipid panel - Statin - will be started if LDL>70 or otherwise medically indicated  Patient is not on statin at home - A1C - Antithrombotic - HOLD until 24hr post-TNK imaging is stable.   Patient is not on antithrombotic at home - DVT ppx - SCDs. Can switch to lovenox once 24hr post-TNK imaging is stable.  - Smoking cessation - will counsel patient -  Blood Pressure Goal: BP less than 180/105  Labetalol IVP , Clevipirex gtt as needed - Telemetry monitoring for arrhythmia - 72h - Swallow screen - will be performed prior to PO intake - Stroke education - will be given - PT/OT/SLP - Dispo: admit to ICU  ______________________________________________________________________    Pt seen by Neuro NP/APP and later by MD. Note/plan to be edited by MD as needed.    Lynnae January, DNP, AGACNP-BC [facility] Please use AMION for contact information & EPIC for messaging.  NEUROHOSPITALIST ADDENDUM Performed a face to face diagnostic evaluation.   I have reviewed the contents of history and physical exam as documented by PA/ARNP/Resident and agree with above documentation.  I have discussed and formulated the above plan as documented. Edits to the note  have been made as needed.  Impression/Key exam findings/Plan: we saw him at the bridge and exam was difficult, specific concerns about his vision deficit and patient reporting headache. His speech was improving with no aphasia noted, he had generalized weakness with no focal weakness. Suspicion for PRES vs occipital lobe stroke. Therefore  he was taken to MRI Brain prior to offering tnkase. We were concerned that giving tnkase in the setting of PRES would pose significant risk of hemorrhage. MRI brain was negative for PRES but notable for a small L occipital lobe infarct. He was offered tnkase for disabling vision deficit. He had a prior R occipital lobe stroke that he was not aware of.  He reports improvement in vision after tnkase. Earlier reported that he could barely see. He drove over to the cafe and almost had to slam on his brakes as he was unable to see the car ahead of him until last minute.  I discussed with patient and his son about staying away from driving indefinitely, he is high risk for accident given his vision deficit from prior stroke.  Will admit him to Neuro ICU and monitor him closely. Etiology of his stroke is unclear but suspect embolic in nature.  This patient is critically ill and at significant risk of neurological worsening, death and care requires constant monitoring of vital signs, hemodynamics,respiratory and cardiac monitoring, neurological assessment, discussion with family, other specialists and medical decision making of high complexity. I spent 60 minutes of neurocritical care time  in the care of  this patient. This was time spent independent of any time provided by nurse practitioner or PA.  Erick Blinks [facility] 09/07/2023  3:13 PM  Erick Blinks, MD [facility] [phone]   If 7pm to 7am, please call on call as listed on AMION.

## 2023-09-07 NOTE — ED Notes (Signed)
Family called by MD Derry Lory

## 2023-09-07 NOTE — ED Provider Notes (Signed)
[place] EMERGENCY DEPARTMENT AT [facility] Provider Note   CSN: [id number] Arrival date & time: 09/07/23  1054     History  Chief Complaint  Patient presents with   Code Stroke    Charles Frost is a 77 y.o. male.  Patient is a 77 year old male who presents as a code stroke.  He has a history of hypertension, rheumatoid arthritis, prostate cancer.  He was eating at a restaurant and had sudden onset of aphasia and right-sided weakness.  He also had a visual field deficit.  He was noted to be hypertensive on arrival.       Home Medications Prior to Admission medications   Medication Sig Start Date End Date Taking? Authorizing Provider  folic acid (FOLVITE) 1 MG tablet Take 1 mg by mouth daily.    [provider]  Meth-Hyo-M Bl-Na Phos-Ph Sal (URO-MP) 118 MG CAPS Take 1 tablet by mouth 3 (three) times daily. 12/09/16   Rhetta Mura, MD  mirabegron ER (MYRBETRIQ) 50 MG TB24 tablet Take 1 tablet (50 mg total) by mouth daily. 12/10/16   Rhetta Mura, MD  opium-belladonna (B&O SUPPRETTES) 16.2-60 MG suppository Place 1 suppository rectally every 6 (six) hours as needed for bladder spasms. 12/09/16   Rhetta Mura, MD  oxybutynin (DITROPAN) 5 MG tablet Take 1 tablet (5 mg total) by mouth 3 (three) times daily. Patient not taking: Reported on 10/08/2022 12/09/16   Rhetta Mura, MD  phenazopyridine (PYRIDIUM) 200 MG tablet Take 1 tablet (200 mg total) by mouth 3 (three) times daily. 12/09/16   Rhetta Mura, MD  tamsulosin (FLOMAX) 0.4 MG CAPS capsule Take 2 capsules (0.8 mg total) by mouth daily after supper. 12/09/16   Rhetta Mura, MD      Allergies    Patient has no known allergies.    Review of Systems   Review of Systems  Unable to perform ROS: Acuity of condition    Physical Exam Updated Vital Signs BP (!) 162/91   Pulse 79   Temp 99.2 F (37.3 C) (Oral)   Resp 13   Wt 80.4 kg   SpO2 97%   BMI 26.95 kg/m   Physical Exam Constitutional:      Appearance: He is well-developed.  HENT:     Head: Normocephalic and atraumatic.  Eyes:     Pupils: Pupils are equal, round, and reactive to light.  Cardiovascular:     Rate and Rhythm: Normal rate and regular rhythm.     Heart sounds: Normal heart sounds.  Pulmonary:     Effort: Pulmonary effort is normal. No respiratory distress.     Breath sounds: Normal breath sounds. No wheezing or rales.  Chest:     Chest wall: No tenderness.  Abdominal:     General: Bowel sounds are normal.     Palpations: Abdomen is soft.     Tenderness: There is no abdominal tenderness. There is no guarding or rebound.  Musculoskeletal:        General: Normal range of motion.     Cervical back: Normal range of motion and neck supple.  Lymphadenopathy:     Cervical: No cervical adenopathy.  Skin:    General: Skin is warm and dry.     Findings: No rash.  Neurological:     Mental Status: He is alert and oriented to person, place, and time.     Comments: Some difficulty with his speech.  Some mild weakness in his right arm.  He does have  some slight aphasia.  Also was noted to have some right side lower visual field deficit.     ED Results / Procedures / Treatments   Labs (all labs ordered are listed, but only abnormal results are displayed) Labs Reviewed  COMPREHENSIVE METABOLIC PANEL - Abnormal; Notable for the following components:      Result Value   Glucose, Bld 110 (*)    All other components within normal limits  I-STAT CHEM 8, ED - Abnormal; Notable for the following components:   Glucose, Bld 109 (*)    Calcium, Ion 0.99 (*)    All other components within normal limits  CBG MONITORING, ED - Abnormal; Notable for the following components:   Glucose-Capillary 109 (*)    All other components within normal limits  PROTIME-INR  APTT  CBC  DIFFERENTIAL  ETHANOL  HEMOGLOBIN A1C    EKG EKG Interpretation Date/Time:  Monday September 07 2023 12:09:42  EST Ventricular Rate:  85 PR Interval:  138 QRS Duration:  100 QT Interval:  356 QTC Calculation: 424 R Axis:   -64  Text Interpretation: Sinus rhythm Inferoposterior infarct, old since last tracing no significant change Confirmed by Rolan Bucco [phone]) on 09/07/2023 12:24:38 PM  Radiology CT ANGIO HEAD NECK W WO CM (CODE STROKE) Result Date: 09/07/2023 CLINICAL DATA:  Neuro deficit, acute, stroke suspected EXAM: CT ANGIOGRAPHY HEAD AND NECK WITH AND WITHOUT CONTRAST TECHNIQUE: Multidetector CT imaging of the head and neck was performed using the standard protocol during bolus administration of intravenous contrast. Multiplanar CT image reconstructions and MIPs were obtained to evaluate the vascular anatomy. Carotid stenosis measurements (when applicable) are obtained utilizing NASCET criteria, using the distal internal carotid diameter as the denominator. RADIATION DOSE REDUCTION: This exam was performed according to the departmental dose-optimization program which includes automated exposure control, adjustment of the mA and/or kV according to patient size and/or use of iterative reconstruction technique. CONTRAST:  75mL OMNIPAQUE IOHEXOL 350 MG/ML SOLN COMPARISON:  Same day CT head and MRI head. FINDINGS: CTA NECK FINDINGS Aortic arch: Great vessel origins are patent. Aortic atherosclerosis. Right carotid system: No evidence of dissection, stenosis (50% or greater), or occlusion. Left carotid system: No evidence of dissection, stenosis (50% or greater), or occlusion. Vertebral arteries: Left dominant. Suspected moderate stenosis of the right vertebral artery origin, although streak artifact limits assessment. Left vertebral artery is patent without significant stenosis. Skeleton: No acute abnormality on limited assessment. Severe lower cervical degenerative disc disease. Other neck: No acute abnormality on limited assessment. Upper chest: Visualized lung apices are clear. Review of the MIP images  confirms the above findings CTA HEAD FINDINGS Anterior circulation: Bilateral intracranial ICAs, MCAs, and ACAs are patent without proximal hemodynamically significant stenosis. Posterior circulation: Bilateral intradural vertebral arteries, basilar artery, and bilateral posterior cerebral arteries are patent without proximal hemodynamically significant stenosis. Right fetal type PCA, anatomic variant. Venous sinuses: As permitted by contrast timing, patent. Anatomic variants: Detailed above. Review of the MIP images confirms the above findings IMPRESSION: 1. No emergent large vessel occlusion. 2. Suspected moderate stenosis of the right vertebral artery origin, although streak artifact limits assessment. 3.  Aortic Atherosclerosis (ICD10-I70.0). Electronically Signed   By: Feliberto Harts M.D.   On: 09/07/2023 12:10   MR BRAIN WO CONTRAST Result Date: 09/07/2023 CLINICAL DATA:  Neuro deficit, acute, stroke suspected EXAM: MRI HEAD WITHOUT CONTRAST TECHNIQUE: Multiplanar, multiecho pulse sequences of the brain and surrounding structures were obtained without intravenous contrast. COMPARISON:  Same day CT head. FINDINGS: Brain:  Small acute infarct in the left periventricular occipital lobe. Slight edema without mass effect. Remote right occipital and bilateral cerebellar infarcts. Moderate to advanced patchy T2/FLAIR hyperintensity white matter, nonspecific but compatible with chronic microvascular ischemic disease. No evidence of acute hemorrhage, mass lesion, midline shift or hydrocephalus. Vascular: Please see forthcoming CTA. Skull and upper cervical spine: Normal marrow signal. Sinuses/Orbits: Mild paranasal sinus mucosal thickening. IMPRESSION: 1. Small acute infarct in the left periventricular occipital lobe. 2. Remote right occipital and bilateral cerebellar infarcts and moderate to severe chronic microvascular ischemic disease. Findings discussed with Dr. Derry Lory via telephone at 11:50 a.m.  Electronically Signed   By: Feliberto Harts M.D.   On: 09/07/2023 12:00   CT HEAD CODE STROKE WO CONTRAST Result Date: 09/07/2023 CLINICAL DATA:  Code stroke.  Neuro deficit, acute, stroke suspected EXAM: CT HEAD WITHOUT CONTRAST TECHNIQUE: Contiguous axial images were obtained from the base of the skull through the vertex without intravenous contrast. RADIATION DOSE REDUCTION: This exam was performed according to the departmental dose-optimization program which includes automated exposure control, adjustment of the mA and/or kV according to patient size and/or use of iterative reconstruction technique. COMPARISON:  None Available. FINDINGS: Brain: Remote right PCA territory infarct. Remote right cerebellar infarct. Advanced patchy and confluent white matter hypodensities are nonspecific but compatible with chronic microvascular ischemic disease. No evidence of acute large vascular territory infarct, acute hemorrhage, mass lesion, midline shift or hydrocephalus. Vascular: No hyperdense vessel identified. Calcific atherosclerosis. Skull: No acute fracture. Sinuses/Orbits: Mild paranasal sinus mucosal thickening. No acute orbital findings. Other: No mastoid effusions. ASPECTS [facility] Stroke Program Early CT Score) Total score (0-10 with 10 being normal): 10. IMPRESSION: 1. No evidence of acute large vascular territory infarct or acute hemorrhage. ASPECTS is 10. 2. Remote right PCA territory and remote right cerebellar infarcts with advanced chronic microvascular ischemic change. An MRI could better assess for acute infarct if clinically warranted. Electronically Signed   By: Feliberto Harts M.D.   On: 09/07/2023 11:16    Procedures Procedures    Medications Ordered in ED Medications  sodium chloride flush (NS) 0.9 % injection 3 mL (has no administration in time range)   stroke: early stages of recovery book (has no administration in time range)  0.9 %  sodium chloride infusion (has no  administration in time range)  acetaminophen (TYLENOL) tablet 650 mg (has no administration in time range)    Or  acetaminophen (TYLENOL) 160 MG/5ML solution 650 mg (has no administration in time range)    Or  acetaminophen (TYLENOL) suppository 650 mg (has no administration in time range)  senna-docusate (Senokot-S) tablet 1 tablet (has no administration in time range)  pantoprazole (PROTONIX) injection 40 mg (has no administration in time range)  clevidipine (CLEVIPREX) infusion 0.5 mg/mL (has no administration in time range)  folic acid (FOLVITE) tablet 1 mg (has no administration in time range)  mirabegron ER (MYRBETRIQ) tablet 50 mg (has no administration in time range)  tamsulosin (FLOMAX) capsule 0.8 mg (has no administration in time range)  tenecteplase (TNKASE) injection for Stroke 20 mg (20 mg Intravenous Given 09/07/23 1147)  iohexol (OMNIPAQUE) 350 MG/ML injection 75 mL (75 mLs Intravenous Contrast Given 09/07/23 1203)    ED Course/ Medical Decision Making/ A&P                                 Medical Decision Making Amount and/or Complexity of Data Reviewed Labs: ordered.  Radiology: ordered.  Risk Decision regarding hospitalization.   Patient presents as a code stroke activation.  He was seen by neurology at the bedside.  He had a CT scan of his head which did not show any evidence of hemorrhage.  Was taken for an emergent MRI which shows an acute stroke.  Was given tPA by neurology.  Will be admitted to the ICU by neurology.  EKG shows a sinus rhythm.  Labs are reviewed and are nonconcerning.  CRITICAL CARE Performed by: Rolan Bucco Total critical care time: 20 minutes Critical care time was exclusive of separately billable procedures and treating other patients. Critical care was necessary to treat or prevent imminent or life-threatening deterioration. Critical care was time spent personally by me on the following activities: development of treatment plan with  patient and/or surrogate as well as nursing, discussions with consultants, evaluation of patient's response to treatment, examination of patient, obtaining history from patient or surrogate, ordering and performing treatments and interventions, ordering and review of laboratory studies, ordering and review of radiographic studies, pulse oximetry and re-evaluation of patient's condition.   Final Clinical Impression(s) / ED Diagnoses Final diagnoses:  Acute ischemic stroke [facility])    Rx / DC Orders ED Discharge Orders     None         Rolan Bucco, MD 09/07/23 1234

## 2023-09-07 NOTE — H&P (Incomplete)
Stroke Neurology Admission History & Physical   CC: CODE STROKE  History is obtained from:patient and wife  HPI: Charles Frost is a 77 y.o. male with a PMH significant for HTN, RA, Prostate CA, GERD who presents as a CODE STROKE due to R-sided weakness, slurred speech and aphasia. Patient was at a cafe and suddenly became aphasic with right sided weakness. EMS was called and patient's weakness became more generalized. Patient was hypertensive with SBP over 200 en route.  On exam at bridge, patient was able to give history, oriented to month, confused to age and year, was dysarthric, slight drift BLE, decreased right lower field of vision, reduced sensation to left arm.  He was taken for STAT CTH which showed  Labetalol x1 was given due to hypertension.  Due to concern for PRES, STAT MRI was ordered. This showed a small hyperacute left occipital infarct.  Management with thrombolytic therapy was explained to the patient, and patient's son and wife thoroughly, as were risks, benefits and alternatives. All questions were answered. Patient, patient's wife and son expressed understanding of the treatment plan and agreed to proceed with thrombolytic treatment. TNK given @ 1147.  Post-TNK, patient was taken for STAT CTA, which showed no LVO. Neurological exam remained the same, with NIH at 6.   LKW: 0915 Modified rankin score: 0-Completely asymptomatic and back to baseline post- stroke IV Thrombolysis: Yes, 1147             Delay in giving TNK includes time needed for BP control, thorough discussion with patient and family, and need for MRI imaging doe to concern for PRES.  Mechanical thrombectomy? No, no LVO Risks, benefits, alternatives of IV thrombolysis were discussed and family/patient agreed to proceed. CT imaging was reviewed personally prior to IV thrombolysis administration with no evidence of bleed.            NIHSS components Score: Comment  1a Level of Conscious 0[x]  1[]  2[]  3[]          1b LOC Questions 0[]  1[x]  2[]           1c LOC Commands 0[x]  1[]  2[]           2 Best Gaze 0[x]  1[]  2[]           3 Visual 0[]  1[x]  2[]  3[]         4 Facial Palsy 0[x]  1[]  2[]  3[]         5a Motor Arm - left 0[x]  1[]  2[]  3[]  4[]  UN[]     5b Motor Arm - Right 0[x]  1[]  2[]  3[]  4[]  UN[]     6a Motor Leg - Left 0[]  1[x]  2[]  3[]  4[]  UN[]     6b Motor Leg - Right 0[]  1[x]  2[]  3[]  4[]  UN[]     7 Limb Ataxia 0[x]  1[]  2[]  3[]  UN[]       8 Sensory 0[]  1[x]  2[]  UN[]         9 Best Language 0[x]  1[]  2[]  3[]         10 Dysarthria 0[]  1[x]  2[]  UN[]         11 Extinct. and Inattention 0[x]  1[]  2[]           TOTAL:   6       ROS: Full ROS was performed and is negative except as noted in the HPI.  Past Medical History:  Diagnosis Date   GERD (gastroesophageal reflux disease)    Hypercholesterolemia    Hypertension    Prostate cancer (HCC)    Rheumatoid arthritis (HCC)  RA    Family History  Problem Relation Age of Onset   Cancer Maternal Aunt    Cancer Paternal Grandfather    Dementia Mother     Social History:   reports that he quit smoking about 23 years ago. His smoking use included cigarettes. He started smoking about 53 years ago. He has a 7.5 pack-year smoking history. He has never used smokeless tobacco. He reports that he does not drink alcohol and does not use drugs.  Medications  Current Facility-Administered Medications:    sodium chloride flush (NS) 0.9 % injection 3 mL, 3 mL, Intravenous, Once, Belfi, Melanie, MD  Current Outpatient Medications:    folic acid (FOLVITE) 1 MG tablet, Take 1 mg by mouth daily., Disp: , Rfl:    Meth-Hyo-M Bl-Na Phos-Ph Sal (URO-MP) 118 MG CAPS, Take 1 tablet by mouth 3 (three) times daily., Disp: , Rfl:    mirabegron ER (MYRBETRIQ) 50 MG TB24 tablet, Take 1 tablet (50 mg total) by mouth daily., Disp: 30 tablet, Rfl: 0   opium-belladonna (B&O SUPPRETTES) 16.2-60 MG suppository, Place 1 suppository rectally every 6 (six) hours as needed for bladder  spasms., Disp: 60 suppository, Rfl: 0   oxybutynin (DITROPAN) 5 MG tablet, Take 1 tablet (5 mg total) by mouth 3 (three) times daily. (Patient not taking: Reported on 10/08/2022), Disp: 90 tablet, Rfl: 0   phenazopyridine (PYRIDIUM) 200 MG tablet, Take 1 tablet (200 mg total) by mouth 3 (three) times daily., Disp: 10 tablet, Rfl: 0   tamsulosin (FLOMAX) 0.4 MG CAPS capsule, Take 2 capsules (0.8 mg total) by mouth daily after supper., Disp: 30 capsule, Rfl: 2  Exam: Current vital signs: BP (!) 162/91   Pulse 79   Temp 99.2 F (37.3 C) (Oral)   Resp 13   Wt 80.4 kg   SpO2 97%   BMI 26.95 kg/m  Vital signs in last 24 hours: Temp:  [99.2 F (37.3 C)] 99.2 F (37.3 C) (12/30 1212) Pulse Rate:  [79-85] 79 (12/30 1215) Resp:  [13-17] 13 (12/30 1215) BP: (162-176)/(91-103) 162/91 (12/30 1215) SpO2:  [97 %-100 %] 97 % (12/30 1215) Weight:  [80.4 kg] 80.4 kg (12/30 1100)  Constitutional: Appears well-developed and well-nourished.  Psych: Affect appropriate to situation.  Eyes: No scleral injection.  HENT: No OP obstruction.  Head: Normocephalic.  Cardiovascular: Normal rate and regular rhythm.  Respiratory: Effort normal, non-labored breathing.  GI: Soft.  No distension. There is no tenderness.  Skin: WDI.    Neurologic Examination    Neuro: Mental Status: Patient is awake, alert, oriented to person, month, and situation. Disoriented to month and age. Patient is able to give a clear and coherent history. No signs of aphasia or neglect. Dysarthria present.  Cranial Nerves: II: Decreased right lower field of vision. Pupils are equal, round, and reactive to light.   III,IV, VI: EOMI without ptosis or diploplia.  V: Facial sensation is symmetric to light touch.  VII: Facial movement is symmetric.  VIII: hearing is intact to voice X: Uvula elevates symmetrically XI: Shoulder shrug is symmetric. XII: tongue is midline without atrophy or fasciculations.  Motor: Tone is normal. Bulk  is normal.  BUE: 5/5, no drift BLE; 4+/5, slight drift Sensory: Reduced sensation to left arm.  Cerebellar: FNF and HKS are intact bilaterally  NIHSS: 6  Labs I have reviewed labs in epic and the results pertinent to this consultation are:  CBC    Component Value Date/Time   WBC 5.7 09/07/2023 1056  RBC 5.08 09/07/2023 1056   HGB 15.3 09/07/2023 1101   HCT 45.0 09/07/2023 1101   PLT 223 09/07/2023 1056   MCV 86.0 09/07/2023 1056   MCH 27.0 09/07/2023 1056   MCHC 31.4 09/07/2023 1056   RDW 15.0 09/07/2023 1056   LYMPHSABS 1.3 09/07/2023 1056   MONOABS 0.5 09/07/2023 1056   EOSABS 0.2 09/07/2023 1056   BASOSABS 0.1 09/07/2023 1056    CMP     Component Value Date/Time   NA 140 09/07/2023 1101   K 4.5 09/07/2023 1101   CL 106 09/07/2023 1101   CO2 23 09/07/2023 1056   GLUCOSE 109 (H) 09/07/2023 1101   BUN 22 09/07/2023 1101   CREATININE 1.00 09/07/2023 1101   CALCIUM 9.8 09/07/2023 1056   PROT 7.2 09/07/2023 1056   ALBUMIN 3.8 09/07/2023 1056   AST 22 09/07/2023 1056   ALT 17 09/07/2023 1056   ALKPHOS 50 09/07/2023 1056   BILITOT 1.0 09/07/2023 1056   GFRNONAA >60 09/07/2023 1056   GFRAA >60 12/08/2016 0424    Lipid Panel  No results found for: "CHOL", "TRIG", "HDL", "CHOLHDL", "VLDL", "LDLCALC", "LDLDIRECT"   Imaging I have reviewed the images obtained:  CT Head without contrast(Personally reviewed): No evidence of acute infarct or hemorrhage. ' ASPECTS 10 Remote right PCA territory and remote right cerebellar infarcts with advanced chronic microvascular ischemic change.    MRI Brain(Personally reviewed): Small hyperacute infarct left occipital lobe Remote right occipital and bilateral cerebellar infarcts Moderate to severe chronic microvascular ischemic disease  Assessment/Plan:   Acute Ischemic Infarct  Acuity: Acute Current Suspected Etiology: pending full stroke work-up, embolic vs cardio-embolic, small vessel disease Continue Evaluation:   -Admit to: ICU -Start Statin if LDL > 70 -Hold Aspirin until 24 hour post IV thrombolysis (TNKase) neuroimaging is stable and without evidence of bleeding - Blood Pressure Goal: BP less than 180/105 -MRI/ECHO/A1C/Lipid panel. -Hyperglycemia management per SSI to maintain glucose 140-180mg /dL. -PT/OT/ST therapies and recommendations when able  CNS  Dysarthria Dysphagia following cerebral infarction  -NPO until cleared by speech -ST -Advance diet as tolerated  Weakness -PT/OT   CV  Essential (primary) hypertension Hypertensive Urgency -Aggressive BP control, Blood Pressure Goal: BP less than 180/105 -Labetalol IVP, Cleviprex gtt as needed    HEME Increased risk of bleeding Coagulopathy secondary to TNK administration - monitor daily CBC  Transfuse for Hgb <7 -Trend PT/PTT/INR  Fluid/Electrolyte Disorders -Replete -Repeat labs -Trend  ID - UA pending   Prophylaxis DVT:  SCDs GI: protonix Bowel: senna  Diet: NPO until cleared by speech  Code Status: Full code   THE FOLLOWING WERE PRESENT ON ADMISSION: CNS -  Acute Ischemic Stroke Cardiovascular - Hypertensive Urgency Hem/Onc - History of Prostate Cancer Rheumatology - History of Rheumatoid Arthritis  Risks, benefits, alternatives of IV thrombolysis were discussed and family/patient agreed to proceed. CT imaging was reviewed personally prior to IV thrombolysis administration with no evidence of bleed. --  Pt seen by Neuro NP/APP and later by MD. Note/plan to be edited by MD as needed.    Lynnae January, DNP, AGACNP-BC [facility] Please use AMION for contact information & EPIC for messaging.

## 2023-09-07 NOTE — ED Notes (Signed)
MD aware that patient passed swallow screen

## 2023-09-07 NOTE — ED Notes (Signed)
Patient transported to MRI with this RN 

## 2023-09-07 NOTE — ED Notes (Signed)
Echo at bedside

## 2023-09-08 ENCOUNTER — Inpatient Hospital Stay (HOSPITAL_COMMUNITY): Payer: Medicare HMO

## 2023-09-08 LAB — LIPID PANEL
Cholesterol: 162 mg/dL (ref 0–200)
HDL: 51 mg/dL (ref >40)
LDL Cholesterol: 105 mg/dL — ABNORMAL HIGH (ref 0–99)
Total CHOL/HDL Ratio: 3.2 {ratio}
Triglycerides: 29 mg/dL (ref <150)
VLDL: 6 mg/dL (ref 0–40)

## 2023-09-08 LAB — HEMOGLOBIN A1C
Hgb A1c MFr Bld: 6.4 % — ABNORMAL HIGH (ref 4.8–5.6)
Mean Plasma Glucose: 137 mg/dL

## 2023-09-08 MED ORDER — ENOXAPARIN SODIUM 40 MG/0.4ML IJ SOSY
40.0000 mg | PREFILLED_SYRINGE | INTRAMUSCULAR | Status: DC
  Administered 2023-09-08: 40 mg via SUBCUTANEOUS
  Filled 2023-09-08: qty 0.4

## 2023-09-08 MED ORDER — CHLORHEXIDINE GLUCONATE CLOTH 2 % EX PADS
6.0000 | MEDICATED_PAD | Freq: Every day | CUTANEOUS | Status: DC
Start: 2023-09-08 — End: 2023-09-09
  Administered 2023-09-08: 6 via TOPICAL

## 2023-09-08 MED ORDER — OXYMETAZOLINE HCL 0.05 % NA SOLN
1.0000 | Freq: Two times a day (BID) | NASAL | Status: DC
  Administered 2023-09-08 – 2023-09-09 (×2): 1 via NASAL
  Filled 2023-09-08: qty 30

## 2023-09-08 MED ORDER — ROSUVASTATIN CALCIUM 20 MG PO TABS
20.0000 mg | ORAL_TABLET | Freq: Every day | ORAL | Status: DC
  Administered 2023-09-08 – 2023-09-09 (×2): 20 mg via ORAL
  Filled 2023-09-08 (×2): qty 1

## 2023-09-08 MED ORDER — ASPIRIN 81 MG PO TBEC
81.0000 mg | DELAYED_RELEASE_TABLET | Freq: Every day | ORAL | Status: DC
  Administered 2023-09-08 – 2023-09-09 (×2): 81 mg via ORAL
  Filled 2023-09-08 (×2): qty 1

## 2023-09-08 MED ORDER — CLOPIDOGREL BISULFATE 75 MG PO TABS
75.0000 mg | ORAL_TABLET | Freq: Every day | ORAL | Status: DC
  Administered 2023-09-08 – 2023-09-09 (×2): 75 mg via ORAL
  Filled 2023-09-08 (×2): qty 1

## 2023-09-08 NOTE — Evaluation (Signed)
 Physical Therapy Evaluation Patient Details Name: Charles Frost MRN: [id number] DOB: 1946-03-09 Today's Date: 09/08/2023  History of Present Illness  77 y.o. male with a PMH significant for HTN, RA, Prostate CA, GERD who presents as a CODE STROKE due to R-sided weakness, slurred speech and aphasia. MRI showed a small hyperacute left occipital infarct.TNK was given.  Clinical Impression   Pt presents with min incoordination, impaired dynamic standing balance, decreased activity tolerance, and impaired vision s/p CVA. Pt to benefit from acute PT to address deficits. Pt ambulated hallway distance with intermittent steadying assist, improving steadiness with further distance. Pt to benefit from OPPT when d/c home. PT to progress mobility as tolerated, and will continue to follow acutely.          If plan is discharge home, recommend the following: A little help with walking and/or transfers;A little help with bathing/dressing/bathroom   Can travel by private vehicle        Equipment Recommendations None recommended by PT  Recommendations for Other Services       Functional Status Assessment Patient has had a recent decline in their functional status and demonstrates the ability to make significant improvements in function in a reasonable and predictable amount of time.     Precautions / Restrictions Precautions Precautions: Fall Precaution Comments: L visual field deficit bilat eyes Restrictions Weight Bearing Restrictions Per Provider Order: No      Mobility  Bed Mobility Overal bed mobility: Needs Assistance             General bed mobility comments: up in chair    Transfers Overall transfer level: Needs assistance Equipment used: None Transfers: Sit to/from Stand Sit to Stand: Min assist           General transfer comment: min steadying when moving sit>stand and stand>sit. stand x2, from recliner both times    Ambulation/Gait Ambulation/Gait assistance:  Min assist Gait Distance (Feet): 200 Feet Assistive device: 1 person hand held assist, Straight cane, None Gait Pattern/deviations: Step-through pattern, Decreased stride length, Narrow base of support Gait velocity: decr     General Gait Details: min steadying assist, especially initially, with narrow BOS and min incoordination LEs noted  Stairs            Wheelchair Mobility     Tilt Bed    Modified Rankin (Stroke Patients Only) Modified Rankin (Stroke Patients Only) Pre-Morbid Rankin Score: No symptoms Modified Rankin: Moderately severe disability     Balance Overall balance assessment: Needs assistance Sitting-balance support: Single extremity supported, Feet supported Sitting balance-Leahy Scale: Good       Standing balance-Leahy Scale: Fair                               Pertinent Vitals/Pain Pain Assessment Pain Assessment: Faces Faces Pain Scale: No hurt Pain Intervention(s): Monitored during session    Home Living Family/patient expects to be discharged to:: Private residence Living Arrangements: Spouse/significant other Available Help at Discharge: Family Type of Home: House Home Access: Stairs to enter Entrance Stairs-Rails: Lawyer of Steps: 2   Home Layout: One level Home Equipment: Cane - single point      Prior Function Prior Level of Function : Independent/Modified Independent             Mobility Comments: Uses cane PRN       Extremity/Trunk Assessment   Upper Extremity Assessment Upper Extremity Assessment: Defer to OT  evaluation    Lower Extremity Assessment Lower Extremity Assessment: Defer to PT evaluation;Overall WFL for tasks assessed (min incoordination bilat intermittently)    Cervical / Trunk Assessment Cervical / Trunk Assessment: Normal  Communication   Communication Communication: No apparent difficulties Cueing Techniques: Verbal cues;Gestural cues  Cognition  Arousal: Alert Behavior During Therapy: WFL for tasks assessed/performed Overall Cognitive Status: Impaired/Different from baseline Area of Impairment: Memory, Following commands, Safety/judgement                     Memory: Decreased short-term memory Following Commands: Follows one step commands with increased time Safety/Judgement: Decreased awareness of deficits              General Comments General comments (skin integrity, edema, etc.): Visual field assessment: impaired LUQ especially    Exercises     Assessment/Plan    PT Assessment Patient needs continued PT services  PT Problem List Decreased mobility;Decreased activity tolerance;Decreased balance;Decreased knowledge of use of DME;Decreased safety awareness;Decreased coordination       PT Treatment Interventions DME instruction;Therapeutic activities;Gait training;Therapeutic exercise;Patient/family education;Balance training;Stair training;Functional mobility training;Neuromuscular re-education    PT Goals (Current goals can be found in the Care Plan section)  Acute Rehab PT Goals Patient Stated Goal: home PT Goal Formulation: With patient Time For Goal Achievement: 09/22/23 Potential to Achieve Goals: Good    Frequency Min 1X/week     Co-evaluation               AM-PAC PT 6 Clicks Mobility  Outcome Measure Help needed turning from your back to your side while in a flat bed without using bedrails?: A Little Help needed moving from lying on your back to sitting on the side of a flat bed without using bedrails?: A Little Help needed moving to and from a bed to a chair (including a wheelchair)?: A Little Help needed standing up from a chair using your arms (e.g., wheelchair or bedside chair)?: A Little Help needed to walk in hospital room?: A Little Help needed climbing 3-5 steps with a railing? : A Little 6 Click Score: 18    End of Session   Activity Tolerance: Patient tolerated  treatment well Patient left: in chair;with call bell/phone within reach;Other (comment) (on toilet in bathroom, pt knows to pull help tab when finished and RN and wife aware) Nurse Communication: Mobility status PT Visit Diagnosis: Other abnormalities of gait and mobility (R26.89);Muscle weakness (generalized) (M62.81)    Time: 8757-8687 (5 mins not included in chargeable time while getting cane) PT Time Calculation (min) (ACUTE ONLY): 30 min   Charges:   PT Evaluation $PT Eval Low Complexity: 1 Low PT Treatments $Therapeutic Activity: 8-22 mins PT General Charges $$ ACUTE PT VISIT: 1 Visit       Johana RAMAN, PT DPT Acute Rehabilitation Services Secure Chat Preferred  Office [phone]   Peyson Delao E Johna 09/08/2023, 2:42 PM

## 2023-09-08 NOTE — Progress Notes (Addendum)
 STROKE TEAM PROGRESS NOTE   BRIEF HPI [name] is a 77 y.o. male with past medical history significant for hypertension, rheumatoid arthritis, prostate cancer, GERD who presented as a code stroke 12/30 due to acute onset of right-sided weakness, slurred speech and aphasia.  Patient was at a caf and suddenly became aphasic with right-sided weakness.  EMS was called and patient's weakness then became more generalized.  En route patient was hypertensive with SBP over 200. In ED, patient was confused due to his age and year, was dysarthric, slight drift BLE, decreased right lower field of vision, reduced sensation to left arm. CT head showed no evidence of acute infarct or acute hemorrhage due to concern for PRES, patient was taken emergently to MRI, which showed small acute infarct in the left periventricular occipital lobe along with remote right occipital and bilateral cerebellar infarcts.  Patient was given TNK at 1147.  Taken for CTA, which was negative for LVO.  NIH on Admission: 6   SIGNIFICANT HOSPITAL EVENTS  12/30: CODE STROKE.   TNK given @ 1147  12/31:   CTH shows suspect small interval infarct in the left parietal cortex.  Known recent infarct in the left occipital white matter.  No acute hemorrhage after TNK  Will transfer out of ICU   INTERIM HISTORY/SUBJECTIVE  Son at bedside.  RN at bedside.  Plan of care and assessment thoroughly discussed.  Will transfer out of ICU today.   Patient's dysarthria has improved since yesterday.  Continues to have decreased right lower field of vision from recent infarct as well as decreased left field of vision from previous strokes.  Patient states that this has been going on the left field of vision issues have been going on about a year.  Was not aware of the previous stroke.    Patient's echo was come back with an EF of 25 to 30% consult to heart failure has been placed Blood pressure adequately controlled.  Denies any prior  known history of cardiac disease though he states he has had regular follow-up visit with his primary care physician.  OBJECTIVE  CBC    Component Value Date/Time   WBC 5.7 09/07/2023 1056   RBC 5.08 09/07/2023 1056   HGB 15.3 09/07/2023 1101   HCT 45.0 09/07/2023 1101   PLT 223 09/07/2023 1056   MCV 86.0 09/07/2023 1056   MCH 27.0 09/07/2023 1056   MCHC 31.4 09/07/2023 1056   RDW 15.0 09/07/2023 1056   LYMPHSABS 1.3 09/07/2023 1056   MONOABS 0.5 09/07/2023 1056   EOSABS 0.2 09/07/2023 1056   BASOSABS 0.1 09/07/2023 1056    BMET    Component Value Date/Time   NA 140 09/07/2023 1101   K 4.5 09/07/2023 1101   CL 106 09/07/2023 1101   CO2 23 09/07/2023 1056   GLUCOSE 109 (H) 09/07/2023 1101   BUN 22 09/07/2023 1101   CREATININE 1.00 09/07/2023 1101   CALCIUM  9.8 09/07/2023 1056   GFRNONAA >60 09/07/2023 1056    IMAGING past 24 hours CT HEAD WO CONTRAST ( ) Result Date: 09/08/2023 CLINICAL DATA:  Stroke follow-up after teen K. EXAM: CT HEAD WITHOUT CONTRAST TECHNIQUE: Contiguous axial images were obtained from the base of the skull through the vertex without intravenous contrast. RADIATION DOSE REDUCTION: This exam was performed according to the departmental dose-optimization program which includes automated exposure control, adjustment of the mA and/or kV according to patient size and/or use of iterative reconstruction technique. COMPARISON:  Brain MRI from  yesterday FINDINGS: Brain: Acute infarct in the left occipital lobe is largely occult. There is an area of possible new cytotoxic edema in the left parietal lobe. Chronic infarcts in the right occipital cortex and bilateral cerebellum. No evidence of new infarct or hemorrhagic conversion. There is chronic small vessel ischemia gliosis which is confluent in the cerebral white matter. No hydrocephalus or masslike finding. Vascular: No hyperdense vessel. Skull: No acute finding Sinuses/Orbits: Sclerosis around the left sphenoid  sinus attributed to chronic inflammation. IMPRESSION: 1. Suspect small interval infarct in the left parietal cortex. Known recent infarct in the left occipital white matter. No acute hemorrhage after TNK. 2. Advanced chronic ischemic injury. Electronically Signed   By: Dorn Roulette M.D.   On: 09/08/2023 10:00   ECHOCARDIOGRAM COMPLETE Result Date: 09/07/2023    ECHOCARDIOGRAM REPORT   Patient Name:   Charles Frost Date of Exam: 09/07/2023 Medical Rec #:  [id number]      Height:       68.0 in Accession #:    [id number]     Weight:       177.2 lb Date of Birth:  09/01/1946     BSA:          1.941 m Patient Age:    77 years       BP:           162/91 mmHg Patient Gender: M              HR:           77 bpm. Exam Location:  Inpatient Procedure: 2D Echo, Cardiac Doppler, Color Doppler and Intracardiac            Opacification Agent Indications:    Stroke  History:        Patient has no prior history of Echocardiogram examinations.                 Risk Factors:Hypertension.  Sonographer:    Ellouise Mose RDCS Referring Phys: [id number] [facility] IMPRESSIONS  1. Left ventricular ejection fraction, by estimation, is 25 to 30%. Left ventricular ejection fraction by PLAX is 34 %. The left ventricle has severely decreased function. The left ventricle demonstrates global hypokinesis. There is mild concentric left  ventricular hypertrophy. Left ventricular diastolic parameters are consistent with Grade I diastolic dysfunction (impaired relaxation).  2. Right ventricular systolic function is normal. The right ventricular size is normal. There is normal pulmonary artery systolic pressure.  3. The mitral valve is normal in structure. Trivial mitral valve regurgitation. No evidence of mitral stenosis.  4. The aortic valve is tricuspid. There is mild calcification of the aortic valve. There is mild thickening of the aortic valve. Aortic valve regurgitation is not visualized. No aortic stenosis is present.  5. The inferior  vena cava is normal in size with greater than 50% respiratory variability, suggesting right atrial pressure of 3 mmHg. FINDINGS  Left Ventricle: Left ventricular ejection fraction, by estimation, is 25 to 30%. Left ventricular ejection fraction by PLAX is 34 %. The left ventricle has severely decreased function. The left ventricle demonstrates global hypokinesis. Definity  contrast agent was given IV to delineate the left ventricular endocardial borders. The left ventricular internal cavity size was normal in size. There is mild concentric left ventricular hypertrophy. Left ventricular diastolic parameters are consistent with Grade I diastolic dysfunction (impaired relaxation). Indeterminate filling pressures. Right Ventricle: The right ventricular size is normal. No increase in right ventricular wall thickness. Right ventricular systolic  function is normal. There is normal pulmonary artery systolic pressure. The tricuspid regurgitant velocity is 1.77 m/s, and  with an assumed right atrial pressure of 3 mmHg, the estimated right ventricular systolic pressure is 15.5 mmHg. Left Atrium: Left atrial size was normal in size. Right Atrium: Right atrial size was normal in size. Pericardium: There is no evidence of pericardial effusion. Mitral Valve: The mitral valve is normal in structure. Trivial mitral valve regurgitation. No evidence of mitral valve stenosis. Tricuspid Valve: The tricuspid valve is normal in structure. Tricuspid valve regurgitation is trivial. No evidence of tricuspid stenosis. Aortic Valve: The aortic valve is tricuspid. There is mild calcification of the aortic valve. There is mild thickening of the aortic valve. Aortic valve regurgitation is not visualized. No aortic stenosis is present. Pulmonic Valve: The pulmonic valve was normal in structure. Pulmonic valve regurgitation is not visualized. No evidence of pulmonic stenosis. Aorta: The aortic root is normal in size and structure. Venous: The  inferior vena cava is normal in size with greater than 50% respiratory variability, suggesting right atrial pressure of 3 mmHg. IAS/Shunts: No atrial level shunt detected by color flow Doppler.  LEFT VENTRICLE PLAX 2D LV EF:         Left            Diastology                ventricular     LV e' medial:    4.46 cm/s                ejection        LV E/e' medial:  9.9                fraction by     LV e' lateral:   5.66 cm/s                PLAX is 34      LV E/e' lateral: 7.8                %. LVIDd:         4.30 cm LVIDs:         3.60 cm LV PW:         1.10 cm LV IVS:        1.20 cm LVOT diam:     2.60 cm LV SV:         74 LV SV Index:   38 LVOT Area:     5.31 cm  LV Volumes (MOD) LV vol d, MOD    57.7 ml A2C: LV vol d, MOD    84.6 ml A4C: LV vol s, MOD    37.3 ml A2C: LV vol s, MOD    57.3 ml A4C: LV SV MOD A2C:   20.4 ml LV SV MOD A4C:   84.6 ml LV SV MOD BP:    24.3 ml RIGHT VENTRICLE            IVC RV S prime:     5.33 cm/s  IVC diam: 1.90 cm TAPSE (M-mode): 1.3 cm LEFT ATRIUM             Index        RIGHT ATRIUM           Index LA diam:        3.50 cm 1.80 cm/m   RA Area:     11.70 cm LA Vol (A2C):   34.8 ml 17.92 ml/m  RA Volume:   20.70 ml  10.66 ml/m LA Vol (A4C):   39.7 ml 20.45 ml/m LA Biplane Vol: 39.9 ml 20.55 ml/m  AORTIC VALVE LVOT Vmax:   84.40 cm/s LVOT Vmean:  50.800 cm/s LVOT VTI:    0.139 m  AORTA Ao Root diam: 3.00 cm Ao Asc diam:  3.80 cm MITRAL VALVE               TRICUSPID VALVE MV Area (PHT): 4.39 cm    TR Peak grad:   12.5 mmHg MV Decel Time: 173 msec    TR Vmax:        177.00 cm/s MV E velocity: 44.00 cm/s MV A velocity: 92.80 cm/s  SHUNTS MV E/A ratio:  0.47        Systemic VTI:  0.14 m                            Systemic Diam: 2.60 cm Annabella Scarce MD Electronically signed by Annabella Scarce MD Signature Date/Time: 09/07/2023/6:02:19 PM    Final     Vitals:   09/08/23 0900 09/08/23 1000 09/08/23 1100 09/08/23 1200  BP: (!) 158/105 (!) 164/99 (!) 156/90 (!) 152/92   Pulse: 78 77 83 86  Resp: 16 10 14 17   Temp:    98.2 F (36.8 C)  TempSrc:    Oral  SpO2: 95% 95% 97% 94%  Weight:      Height:        PHYSICAL EXAM General:  Alert, well-nourished, well-developed pleasant elderly Caucasian male in no acute distress Psych:  Mood and affect appropriate for situation CV: Regular rate and rhythm on monitor Respiratory:  Regular, unlabored respirations on room air GI: Abdomen soft and nontender   NEURO:  Mental Status: AA&Ox3, improved from yesterday Speech/Language: Dysarthria present, but improved since yesterday..  Naming, repetition, fluency, and comprehension intact.  Cranial Nerves:  II: Decreased right lower field of vision, decreased left peripheral field of vision III, IV, VI: EOMI. Eyelids elevate symmetrically.  V: Sensation is intact to light touch and symmetrical to face.  VII: Face is symmetrical resting and smiling VIII: hearing intact to voice. IX, X: Palate elevates symmetrically. Phonation is normal.  KP:Dynloizm shrug 5/5. XII: tongue is midline without fasciculations. Motor: 5/5 strength to all muscle groups tested.  Tone: is normal and bulk is normal Sensation-improved sensation to left arm Coordination: FTN intact bilaterally, HKS: no ataxia in BLE.No drift.  Gait- deferred  Most Recent NIH: 1   ASSESSMENT/PLAN  Acute Ischemic Infarct:  left occipital lobe s/p TNK Etiology:  likely embolic, secondary to small vessel disease Code Stroke CT head No acute abnormality.  ASPECTS 10.    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 Therapy recommendations:  Pending Disposition:  pending  Hx of Stroke/TIA Remote infarcts R occipital and bilateral cerebellar seen on MRI Unknown previously  Hypertension Home meds:  none Stable Blood Pressure Goal: BP less than 180/105   Hyperlipidemia Home meds:  lipitor 20mg , changed to Crestor  20mg  LDL 105, goal < 70 Continue statin at discharge  Diabetes type II, Pre-Diabetic Home meds:  none HgbA1c 6.4, goal < 7.0 CBGs SSI Recommend close follow-up with PCP for better DM control   Other Active Problems Rheumatoid arthritis History of prostate cancer  Hospital day # 1   Pt seen by Neuro NP/APP and later by MD. Note/plan to be edited by MD as needed.    Rocky JAYSON Likes, DNP, AGACNP-BC [facility] Please use AMION for contact information & EPIC for messaging.  I have personally obtained history,examined this patient, reviewed notes, independently viewed imaging studies, participated in medical decision making and plan of care.ROS completed by me personally and pertinent positives fully documented  I have made any additions or clarifications directly to the above note. Agree with note above.  Patient presented with sudden onset of dysarthria and right-sided vision difficulties and received IV TNK and has obtained substantial improvement.  MRI scan confirms left occipital periventricular infarct and old right occipital infarct.  Continue strict blood pressure control as per post TNK protocol.  Mobilize out of bed.  Therapy consults.  Will transfer out of ICU later today.  Start aspirin  and  Plavix  after 24 hours post TNK.  Long discussion with patient and his son at the bedside and answered questions.  Greater than 50% time during this 50-minute visit was spent in counseling and coordination of care about his stroke and discussion with patient and family and answering questions.  Eather Popp, MD Medical Director [facility] St[facility] Pager: [phone] 09/08/2023 1:50 PM   To contact Stroke Continuity provider, please refer to [email or web address]. After hours, contact General Neurology

## 2023-09-08 NOTE — Progress Notes (Signed)
 OT Cancellation Note  Patient Details Name: Charles Frost MRN: [id number] DOB: 03/09/1946   Cancelled Treatment:    Reason Eval/Treat Not Completed: Active bedrest order Communicated with nursing regarding bedrest order. Nursing to let therapy know when bedrest order is lifted to allow therapy evaluations to be completed.   Leita Howell, OTR/L,CBIS  Supplemental OT - [facility] and [facility] Secure Chat Preferred   09/08/2023, 8:22 AM
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
## 2023-09-08 NOTE — Progress Notes (Signed)
2225H patient arrived in the unit via wheelchair accompanied by RN
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
## 2023-09-09 DIAGNOSIS — I5021 Acute systolic (congestive) heart failure: Secondary | ICD-10-CM

## 2023-09-09 MED ORDER — SACUBITRIL-VALSARTAN 24-26 MG PO TABS: 1.0000 | ORAL_TABLET | Freq: Two times a day (BID) | ORAL | Status: DC

## 2023-09-09 MED ORDER — ROSUVASTATIN CALCIUM 20 MG PO TABS: 20.0000 mg | ORAL_TABLET | Freq: Every day | ORAL | 1 refills | Status: DC

## 2023-09-09 MED ORDER — CLOPIDOGREL BISULFATE 75 MG PO TABS: 75.0000 mg | ORAL_TABLET | Freq: Every day | ORAL | 0 refills | Status: DC

## 2023-09-09 MED ORDER — SACUBITRIL-VALSARTAN 24-26 MG PO TABS: 1.0000 | ORAL_TABLET | Freq: Two times a day (BID) | ORAL | 0 refills | Status: DC

## 2023-09-09 MED ORDER — METOPROLOL SUCCINATE ER 25 MG PO TB24: 25.0000 mg | ORAL_TABLET | Freq: Every day | ORAL | 0 refills | Status: DC

## 2023-09-09 MED ORDER — METOPROLOL SUCCINATE ER 25 MG PO TB24: 25.0000 mg | ORAL_TABLET | Freq: Every day | ORAL | Status: DC

## 2023-09-09 MED ORDER — ASPIRIN 81 MG PO TBEC: 81.0000 mg | DELAYED_RELEASE_TABLET | Freq: Every day | ORAL | 12 refills | Status: DC

## 2023-09-09 NOTE — TOC Transition Note (Signed)
 Transition of Care [facility]) - Discharge Note   Patient Details  Name: Charles Frost MRN: [id number] Date of Birth: 1946-08-31  Transition of Care [facility]) CM/SW Contact:  Andrez JULIANNA George, RN Phone Number: 09/09/2023, 1:20 PM   Clinical Narrative:     Pt is discharging home with outpatient rehab at [facility]. Pt states he has been there before and would like to attend there again. Referral sent and information on the AVS. DME at home: cane/ walker/ shower seat Pt was driving but wife can provide needed transportation. Wife and pt manage his medications.  Pt has transportation home.  Final next level of care: OP Rehab Barriers to Discharge: No Barriers Identified   Patient Goals and CMS Choice     Choice offered to / list presented to : Patient      Discharge Placement                       Discharge Plan and Services Additional resources added to the After Visit Summary for                                       Social Drivers of Health (SDOH) Interventions SDOH Screenings   Food Insecurity: No Food Insecurity (09/09/2023)  Housing: Low Risk  (09/09/2023)  Transportation Needs: No Transportation Needs (09/09/2023)  Utilities: Not At Risk (09/09/2023)  Social Connections: Unknown (09/09/2023)  Tobacco Use: Medium Risk (08/19/2023)   Received from [facility]     Readmission Risk Interventions     No data to display

## 2023-09-09 NOTE — Plan of Care (Signed)
  Problem: Nutrition: Goal: Dietary intake will improve Outcome: Progressing   Problem: Education: Goal: Knowledge of General Education information will improve Description: Including pain rating scale, medication(s)/side effects and non-pharmacologic comfort measures Outcome: Progressing   Problem: Activity: Goal: Risk for activity intolerance will decrease Outcome: Progressing   Problem: Pain Management: Goal: General experience of comfort will improve Outcome: Progressing   Problem: Safety: Goal: Ability to remain free from injury will improve Outcome: Progressing

## 2023-09-09 NOTE — Discharge Summary (Addendum)
 Stroke Discharge Summary  Patient ID: Charles Frost   MRN: [id number]      DOB: 1946/03/09  Date of Admission: 09/07/2023 Date of Discharge: 09/09/2023  Attending Physician:  Stroke, Md, MD Consultant(s):    cardiology  Patient's PCP:  Charles Dwayne NOVAK, FNP  DISCHARGE PRIMARY DIAGNOSIS: Acute left occipital lobe infarction likely of cardioembolic etiology s/p TNKase  administration   Secondary Diagnoses: Essential Hypertension Hyperlipidemia Borderline diabetes Acute heart failure with reduced ejection fraction Rheumatoid arthritis Prostate cancer s/p TURP 2017, ADT, and radiation therapy  Right inferior quadrantanopsia  Allergies as of 09/09/2023   No Known Allergies      Medication List     STOP taking these medications    amLODipine 5 MG tablet Commonly known as: NORVASC   amoxicillin -clavulanate 875-125 MG tablet Commonly known as: AUGMENTIN    atorvastatin 20 MG tablet Commonly known as: LIPITOR   ibuprofen  800 MG tablet Commonly known as: ADVIL        TAKE these medications    aspirin  EC 81 MG tablet Take 1 tablet (81 mg total) by mouth daily. Swallow whole. Start taking on: September 10, 2023   citalopram 20 MG tablet Commonly known as: CELEXA Take 20 mg by mouth daily as needed (mood).   clopidogrel  75 MG tablet Commonly known as: PLAVIX  Take 1 tablet (75 mg total) by mouth daily. Start taking on: September 10, 2023   FLUoxetine 20 MG capsule Commonly known as: PROZAC Take 20 mg by mouth daily.   gabapentin  300 MG capsule Commonly known as: NEURONTIN  Take 300 mg by mouth at bedtime.   Humira (2 Pen) 40 MG/0.4ML pen Generic drug: adalimumab Inject 40 mg into the skin every 14 (fourteen) days.   hydroxychloroquine 200 MG tablet Commonly known as: PLAQUENIL Take 400 mg by mouth daily.   leflunomide 20 MG tablet Commonly known as: ARAVA Take 20 mg by mouth daily.   metoprolol  succinate 25 MG 24 hr tablet Commonly known as:  TOPROL -XL Take 1 tablet (25 mg total) by mouth daily.   omeprazole 20 MG capsule Commonly known as: PRILOSEC Take 20 mg by mouth daily.   rosuvastatin  20 MG tablet Commonly known as: CRESTOR  Take 1 tablet (20 mg total) by mouth daily. Start taking on: September 10, 2023   sacubitril -valsartan  24-26 MG Commonly known as: ENTRESTO  Take 1 tablet by mouth 2 (two) times daily.   tamsulosin  0.4 MG Caps capsule Commonly known as: FLOMAX  Take 2 capsules (0.8 mg total) by mouth daily after supper.   traZODone 150 MG tablet Commonly known as: DESYREL Take 150 mg by mouth at bedtime as needed for sleep.   Voltaren 1 % Gel Generic drug: diclofenac Sodium Apply 2 g topically 2 (two) times daily as needed (pain).       LABORATORY STUDIES CBC    Component Value Date/Time   WBC 5.7 09/07/2023 1056   RBC 5.08 09/07/2023 1056   HGB 15.3 09/07/2023 1101   HCT 45.0 09/07/2023 1101   PLT 223 09/07/2023 1056   MCV 86.0 09/07/2023 1056   MCH 27.0 09/07/2023 1056   MCHC 31.4 09/07/2023 1056   RDW 15.0 09/07/2023 1056   LYMPHSABS 1.3 09/07/2023 1056   MONOABS 0.5 09/07/2023 1056   EOSABS 0.2 09/07/2023 1056   BASOSABS 0.1 09/07/2023 1056   CMP    Component Value Date/Time   NA 140 09/07/2023 1101   K 4.5 09/07/2023 1101   CL 106 09/07/2023 1101   CO2 23  09/07/2023 1056   GLUCOSE 109 (H) 09/07/2023 1101   BUN 22 09/07/2023 1101   CREATININE 1.00 09/07/2023 1101   CALCIUM  9.8 09/07/2023 1056   PROT 7.2 09/07/2023 1056   ALBUMIN 3.8 09/07/2023 1056   AST 22 09/07/2023 1056   ALT 17 09/07/2023 1056   ALKPHOS 50 09/07/2023 1056   BILITOT 1.0 09/07/2023 1056   GFRNONAA >60 09/07/2023 1056   GFRAA >60 12/08/2016 0424   COAGS Lab Results  Component Value Date   INR 1.0 09/07/2023   Lipid Panel    Component Value Date/Time   CHOL 162 09/08/2023 0629   TRIG 29 09/08/2023 0629   HDL 51 09/08/2023 0629   CHOLHDL 3.2 09/08/2023 0629   VLDL 6 09/08/2023 0629   LDLCALC 105 (H)  09/08/2023 0629   HgbA1C  Lab Results  Component Value Date   HGBA1C 6.4 (H) 09/07/2023   Urine Drug Screen Drugs of Abuse  No results found for: LABOPIA, COCAINSCRNUR, LABBENZ, AMPHETMU, THCU, LABBARB   Alcohol  Level    Component Value Date/Time   ETH <10 09/07/2023 1056   SIGNIFICANT DIAGNOSTIC STUDIES CT HEAD WO CONTRAST ( ) Result Date: 09/08/2023 CLINICAL DATA:  Stroke follow-up after teen K. EXAM: CT HEAD WITHOUT CONTRAST TECHNIQUE: Contiguous axial images were obtained from the base of the skull through the vertex without intravenous contrast. RADIATION DOSE REDUCTION: This exam was performed according to the departmental dose-optimization program which includes automated exposure control, adjustment of the mA and/or kV according to patient size and/or use of iterative reconstruction technique. COMPARISON:  Brain MRI from yesterday FINDINGS: Brain: Acute infarct in the left occipital lobe is largely occult. There is an area of possible new cytotoxic edema in the left parietal lobe. Chronic infarcts in the right occipital cortex and bilateral cerebellum. No evidence of new infarct or hemorrhagic conversion. There is chronic small vessel ischemia gliosis which is confluent in the cerebral white matter. No hydrocephalus or masslike finding. Vascular: No hyperdense vessel. Skull: No acute finding Sinuses/Orbits: Sclerosis around the left sphenoid sinus attributed to chronic inflammation. IMPRESSION: 1. Suspect small interval infarct in the left parietal cortex. Known recent infarct in the left occipital white matter. No acute hemorrhage after TNK. 2. Advanced chronic ischemic injury. Electronically Signed   By: Dorn Roulette M.D.   On: 09/08/2023 10:00   ECHOCARDIOGRAM COMPLETE Result Date: 09/07/2023    ECHOCARDIOGRAM REPORT   Patient Name:   Charles Frost Date of Exam: 09/07/2023 Medical Rec #:  [id number]      Height:       68.0 in Accession #:    [id number]     Weight:        177.2 lb Date of Birth:  1946/05/21     BSA:          1.941 m Patient Age:    77 years       BP:           162/91 mmHg Patient Gender: M              HR:           77 bpm. Exam Location:  Inpatient Procedure: 2D Echo, Cardiac Doppler, Color Doppler and Intracardiac            Opacification Agent Indications:    Stroke  History:        Patient has no prior history of Echocardiogram examinations.  Risk Factors:Hypertension.  Sonographer:    Ellouise Mose RDCS Referring Phys: [id number] [facility] IMPRESSIONS  1. Left ventricular ejection fraction, by estimation, is 25 to 30%. Left ventricular ejection fraction by PLAX is 34 %. The left ventricle has severely decreased function. The left ventricle demonstrates global hypokinesis. There is mild concentric left  ventricular hypertrophy. Left ventricular diastolic parameters are consistent with Grade I diastolic dysfunction (impaired relaxation).  2. Right ventricular systolic function is normal. The right ventricular size is normal. There is normal pulmonary artery systolic pressure.  3. The mitral valve is normal in structure. Trivial mitral valve regurgitation. No evidence of mitral stenosis.  4. The aortic valve is tricuspid. There is mild calcification of the aortic valve. There is mild thickening of the aortic valve. Aortic valve regurgitation is not visualized. No aortic stenosis is present.  5. The inferior vena cava is normal in size with greater than 50% respiratory variability, suggesting right atrial pressure of 3 mmHg. FINDINGS  Left Ventricle: Left ventricular ejection fraction, by estimation, is 25 to 30%. Left ventricular ejection fraction by PLAX is 34 %. The left ventricle has severely decreased function. The left ventricle demonstrates global hypokinesis. Definity  contrast agent was given IV to delineate the left ventricular endocardial borders. The left ventricular internal cavity size was normal in size. There is mild concentric  left ventricular hypertrophy. Left ventricular diastolic parameters are consistent with Grade I diastolic dysfunction (impaired relaxation). Indeterminate filling pressures. Right Ventricle: The right ventricular size is normal. No increase in right ventricular wall thickness. Right ventricular systolic function is normal. There is normal pulmonary artery systolic pressure. The tricuspid regurgitant velocity is 1.77 m/s, and  with an assumed right atrial pressure of 3 mmHg, the estimated right ventricular systolic pressure is 15.5 mmHg. Left Atrium: Left atrial size was normal in size. Right Atrium: Right atrial size was normal in size. Pericardium: There is no evidence of pericardial effusion. Mitral Valve: The mitral valve is normal in structure. Trivial mitral valve regurgitation. No evidence of mitral valve stenosis. Tricuspid Valve: The tricuspid valve is normal in structure. Tricuspid valve regurgitation is trivial. No evidence of tricuspid stenosis. Aortic Valve: The aortic valve is tricuspid. There is mild calcification of the aortic valve. There is mild thickening of the aortic valve. Aortic valve regurgitation is not visualized. No aortic stenosis is present. Pulmonic Valve: The pulmonic valve was normal in structure. Pulmonic valve regurgitation is not visualized. No evidence of pulmonic stenosis. Aorta: The aortic root is normal in size and structure. Venous: The inferior vena cava is normal in size with greater than 50% respiratory variability, suggesting right atrial pressure of 3 mmHg. IAS/Shunts: No atrial level shunt detected by color flow Doppler.  LEFT VENTRICLE PLAX 2D LV EF:         Left            Diastology                ventricular     LV e' medial:    4.46 cm/s                ejection        LV E/e' medial:  9.9                fraction by     LV e' lateral:   5.66 cm/s                PLAX is 34  LV E/e' lateral: 7.8                %. LVIDd:         4.30 cm LVIDs:         3.60 cm LV PW:          1.10 cm LV IVS:        1.20 cm LVOT diam:     2.60 cm LV SV:         74 LV SV Index:   38 LVOT Area:     5.31 cm  LV Volumes (MOD) LV vol d, MOD    57.7 ml A2C: LV vol d, MOD    84.6 ml A4C: LV vol s, MOD    37.3 ml A2C: LV vol s, MOD    57.3 ml A4C: LV SV MOD A2C:   20.4 ml LV SV MOD A4C:   84.6 ml LV SV MOD BP:    24.3 ml RIGHT VENTRICLE            IVC RV S prime:     5.33 cm/s  IVC diam: 1.90 cm TAPSE (M-mode): 1.3 cm LEFT ATRIUM             Index        RIGHT ATRIUM           Index LA diam:        3.50 cm 1.80 cm/m   RA Area:     11.70 cm LA Vol (A2C):   34.8 ml 17.92 ml/m  RA Volume:   20.70 ml  10.66 ml/m LA Vol (A4C):   39.7 ml 20.45 ml/m LA Biplane Vol: 39.9 ml 20.55 ml/m  AORTIC VALVE LVOT Vmax:   84.40 cm/s LVOT Vmean:  50.800 cm/s LVOT VTI:    0.139 m  AORTA Ao Root diam: 3.00 cm Ao Asc diam:  3.80 cm MITRAL VALVE               TRICUSPID VALVE MV Area (PHT): 4.39 cm    TR Peak grad:   12.5 mmHg MV Decel Time: 173 msec    TR Vmax:        177.00 cm/s MV E velocity: 44.00 cm/s MV A velocity: 92.80 cm/s  SHUNTS MV E/A ratio:  0.47        Systemic VTI:  0.14 m                            Systemic Diam: 2.60 cm Annabella Scarce MD Electronically signed by Annabella Scarce MD Signature Date/Time: 09/07/2023/6:02:19 PM    Final    CT ANGIO HEAD NECK W WO CM (CODE STROKE) Result Date: 09/07/2023 CLINICAL DATA:  Neuro deficit, acute, stroke suspected EXAM: CT ANGIOGRAPHY HEAD AND NECK WITH AND WITHOUT CONTRAST TECHNIQUE: Multidetector CT imaging of the head and neck was performed using the standard protocol during bolus administration of intravenous contrast. Multiplanar CT image reconstructions and MIPs were obtained to evaluate the vascular anatomy. Carotid stenosis measurements (when applicable) are obtained utilizing NASCET criteria, using the distal internal carotid diameter as the denominator. RADIATION DOSE REDUCTION: This exam was performed according to the departmental dose-optimization  program which includes automated exposure control, adjustment of the mA and/or kV according to patient size and/or use of iterative reconstruction technique. CONTRAST:  75mL OMNIPAQUE  IOHEXOL  350 MG/ML SOLN COMPARISON:  Same day CT head and MRI head. FINDINGS: CTA NECK FINDINGS Aortic arch:  Great vessel origins are patent. Aortic atherosclerosis. Right carotid system: No evidence of dissection, stenosis (50% or greater), or occlusion. Left carotid system: No evidence of dissection, stenosis (50% or greater), or occlusion. Vertebral arteries: Left dominant. Suspected moderate stenosis of the right vertebral artery origin, although streak artifact limits assessment. Left vertebral artery is patent without significant stenosis. Skeleton: No acute abnormality on limited assessment. Severe lower cervical degenerative disc disease. Other neck: No acute abnormality on limited assessment. Upper chest: Visualized lung apices are clear. Review of the MIP images confirms the above findings CTA HEAD FINDINGS Anterior circulation: Bilateral intracranial ICAs, MCAs, and ACAs are patent without proximal hemodynamically significant stenosis. Posterior circulation: Bilateral intradural vertebral arteries, basilar artery, and bilateral posterior cerebral arteries are patent without proximal hemodynamically significant stenosis. Right fetal type PCA, anatomic variant. Venous sinuses: As permitted by contrast timing, patent. Anatomic variants: Detailed above. Review of the MIP images confirms the above findings IMPRESSION: 1. No emergent large vessel occlusion. 2. Suspected moderate stenosis of the right vertebral artery origin, although streak artifact limits assessment. 3.  Aortic Atherosclerosis (ICD10-I70.0). Electronically Signed   By: Gilmore GORMAN Molt M.D.   On: 09/07/2023 12:10   MR BRAIN WO CONTRAST Result Date: 09/07/2023 CLINICAL DATA:  Neuro deficit, acute, stroke suspected EXAM: MRI HEAD WITHOUT CONTRAST TECHNIQUE:  Multiplanar, multiecho pulse sequences of the brain and surrounding structures were obtained without intravenous contrast. COMPARISON:  Same day CT head. FINDINGS: Brain: Small acute infarct in the left periventricular occipital lobe. Slight edema without mass effect. Remote right occipital and bilateral cerebellar infarcts. Moderate to advanced patchy T2/FLAIR hyperintensity white matter, nonspecific but compatible with chronic microvascular ischemic disease. No evidence of acute hemorrhage, mass lesion, midline shift or hydrocephalus. Vascular: Please see forthcoming CTA. Skull and upper cervical spine: Normal marrow signal. Sinuses/Orbits: Mild paranasal sinus mucosal thickening. IMPRESSION: 1. Small acute infarct in the left periventricular occipital lobe. 2. Remote right occipital and bilateral cerebellar infarcts and moderate to severe chronic microvascular ischemic disease. Findings discussed with Dr. VANESSA via telephone at 11:50 a.m. Electronically Signed   By: Gilmore GORMAN Molt M.D.   On: 09/07/2023 12:00   CT HEAD CODE STROKE WO CONTRAST Result Date: 09/07/2023 CLINICAL DATA:  Code stroke.  Neuro deficit, acute, stroke suspected EXAM: CT HEAD WITHOUT CONTRAST TECHNIQUE: Contiguous axial images were obtained from the base of the skull through the vertex without intravenous contrast. RADIATION DOSE REDUCTION: This exam was performed according to the departmental dose-optimization program which includes automated exposure control, adjustment of the mA and/or kV according to patient size and/or use of iterative reconstruction technique. COMPARISON:  None Available. FINDINGS: Brain: Remote right PCA territory infarct. Remote right cerebellar infarct. Advanced patchy and confluent white matter hypodensities are nonspecific but compatible with chronic microvascular ischemic disease. No evidence of acute large vascular territory infarct, acute hemorrhage, mass lesion, midline shift or hydrocephalus.  Vascular: No hyperdense vessel identified. Calcific atherosclerosis. Skull: No acute fracture. Sinuses/Orbits: Mild paranasal sinus mucosal thickening. No acute orbital findings. Other: No mastoid effusions. ASPECTS [facility] Stroke Program Early CT Score) Total score (0-10 with 10 being normal): 10. IMPRESSION: 1. No evidence of acute large vascular territory infarct or acute hemorrhage. ASPECTS is 10. 2. Remote right PCA territory and remote right cerebellar infarcts with advanced chronic microvascular ischemic change. An MRI could better assess for acute infarct if clinically warranted. Electronically Signed   By: Gilmore GORMAN Molt M.D.   On: 09/07/2023 11:16    HISTORY OF PRESENT ILLNESS 78 y.o.  patient with history of HTN, RA, prostate cancer s/p TURP 2017, ADT, and radiation, GERD was admitted with acute onset right-sided weakness, slurred speech, and right inferior visual field deficit with hypertensive blood pressures with SBP greater than 200.  Stat CT head was negative for acute finding and patient was taken to stat MRI brain due to concern for complications of TNKase  in the setting of possible PRES. MRI imaging revealed a tiny left occipital lobe stroke and TNKase  was administered after consent was obtained by patient and patient's wife.  NIHSS on arrival 6.  HOSPITAL COURSE Acute Ischemic Infarct:  left occipital lobe s/p TNK Etiology:  likely embolic, secondary to small vessel disease Code Stroke CT head No acute abnormality.  ASPECTS 10.    CTA head & neck  No emergent LVO Suspected moderate stenosis of the right vertebral artery MRI   Small acute infarct in the left periventricular occipital lobe Remote right occipital and bilateral cerebellar infarcts Moderate to severe chronic microvascular ischemic disease CT head 24 hours post TNK administration Suspect small interval infarct in the left parietal cortex Known recent infarct in the left occipital white matter No acute hemorrhage  after TNK 2D Echo  EF 25 to 30%, global hypokinesis, mild concentric LVH, grade 1 diastolic dysfunction, mild thickening of the aortic valve Cardiology on board LDL 105 HgbA1c 6.4 VTE prophylaxis - lovenox  during admission No antithrombotic prior to admission, now on aspirin  81 mg daily and clopidogrel  75 mg daily for 3 weeks weeks and then aspirin  alone. Therapy recommendations:  Outpatient PT/OT/ST Disposition: Discharge to home   Hx of Stroke/TIA Remote infarcts R occipital and bilateral cerebellar seen on MRI Unknown previously  Acute HFrEF LVEF on echo 09/07/23 is 25-30% with no prior known cardiac disease Cardiology consulted Per cardiology: start Toprol  25 mg daily, Entresto  24/26 mg twice daily Plan for further GDMT at outpatient cardiology follow up including MRA, SLGT2i initation, bmet, TSH, no urgent need for cardiac cath during current admission, can be arranged outpatient.  Cardiology arranging outpatient follow up with [facility]  Hypertension Home meds:  Norvasc Stable Will discontinue home Norvasc in the setting of initiation of GDMT per heart failure team as below Blood pressure goal: gradual normotension    Hyperlipidemia Home meds:  lipitor 20mg , changed to Crestor  20mg  LDL 105, goal < 70 Continue statin at discharge   Diabetes type II, Pre-Diabetic Home meds:  none HgbA1c 6.4, goal < 7.0 CBGs SSI Recommend close follow-up with PCP for better DM control   Other Active Problems Rheumatoid arthritis History of prostate cancer S/p TURP 2017, ADT, radiation therapy   DISCHARGE EXAM PHYSICAL EXAM General:  Alert, well-nourished, well-developed pleasant elderly male in no acute distress Psych:  Mood and affect appropriate for situation, calm and cooperative with exam CV: Regular rate and rhythm on monitor Respiratory:  Regular, unlabored respirations on room air GI: Abdomen soft and nontender   NEURO:  Mental Status: AA&Ox3, improved from  yesterday Speech/Language: Very mild dysarthria present, continually improved from days prior.  Naming, repetition, fluency, and comprehension intact.   Cranial Nerves:  II: Improved right lower field of vision deficit, decreased left peripheral field of vision (reported as chronic for one year) III, IV, VI: EOMI. Eyelids elevate symmetrically. No gaze preference.  V: Sensation is intact to light touch and symmetrical to face.  VII: Face is symmetrical resting and smiling VIII: Hearing is intact to voice. IX, X: Palate elevates symmetrically. Phonation is normal.  KP:Dynloizm shrug  5/5. XII: tongue is midline without fasciculations. Motor: 5/5 strength to all muscle groups tested.  Tone: is normal and bulk is normal Sensation-improved sensation to left arm Coordination: FTN intact bilaterally, HKS: no ataxia in BLE.No drift.  Gait: Deferred   Most Recent NIH: 1  Discharge Diet       Diet   Diet regular Room service appropriate? Yes; Fluid consistency: Thin   liquids  DISCHARGE PLAN Disposition: Home aspirin  81 mg daily and clopidogrel  75 mg daily for secondary stroke prevention for 3 weeks then aspirin  81 mg daily alone. Ongoing stroke risk factor control by Primary Care Physician at time of discharge Follow-up PCP Charles Dwayne NOVAK, FNP in 2 weeks. Follow up with [facility] Follow-up in [facility] in 8 weeks, office to schedule an appointment.   35 minutes were spent preparing discharge.  Stevi Toberman, AGACNP-BC [facility] Pager: [phone]  I have personally obtained history,examined this patient, reviewed notes, independently viewed imaging studies, participated in medical decision making and plan of care.ROS completed by me personally and pertinent positives fully documented  I have made any additions or clarifications directly to the above note. Agree with note above.    Eather Popp, MD Medical  Director [facility] Pager: [phone] 09/09/2023 2:44 PM

## 2023-09-09 NOTE — Plan of Care (Signed)

## 2023-09-09 NOTE — Progress Notes (Signed)
 Discharge instructions (including medications) discussed with and copy provided to patient/caregiver. Both verbalized understanding. Primary RN removed PIV and heart monitor.  Patient assisted with dressing and discharged home with family.

## 2023-09-09 NOTE — Progress Notes (Addendum)
 STROKE TEAM PROGRESS NOTE   BRIEF HPI [name] is a 78 y.o. male with past medical history significant for HTN, rheumatoid arthritis, prostate cancer, GERD who presented as a code stroke 12/30 due to acute onset of right-sided weakness, slurred speech and aphasia.  Patient was at a caf and suddenly became aphasic with right-sided weakness.  EMS was called and patient's weakness then became more generalized.  En route patient was hypertensive with SBP over 200. In ED, patient was confused due to his age and year, was dysarthric, slight drift BLE, decreased right lower field of vision, reduced sensation to left arm. CT head showed no evidence of acute infarct or acute hemorrhage due to concern for PRES, patient was taken emergently to MRI, which showed small acute infarct in the left periventricular occipital lobe along with remote right occipital and bilateral cerebellar infarcts.  Patient was given TNK at 1147.  Taken for CTA, which was negative for LVO.  NIH on Admission: 6  SIGNIFICANT HOSPITAL EVENTS  12/30: CODE STROKE.  - TNK given @ 1147 - Echo with LVEF 25-30%, patient with no known history of cardiac disease PTA  12/31:  - CTH shows suspect small interval infarct in the left parietal cortex.  Known recent infarct in the left occipital white matter.  No acute hemorrhage after TNK - Transfered out of ICU  INTERIM HISTORY/SUBJECTIVE Family is present at bedside.  Improved vision today, patient is able to count fingers consistently and correctly today in his right inferior field of vision.  Left visual field of vision loss chronic from previous strokes.  Attempted to reach out to heart failure team yesterday and this morning, without success.  Will reattempt consultation prior to discharge so patient can establish care for newly identified heart failure with EF 25-30%  OBJECTIVE CBC    Component Value Date/Time   WBC 5.7 09/07/2023 1056   RBC 5.08 09/07/2023 1056   HGB 15.3  09/07/2023 1101   HCT 45.0 09/07/2023 1101   PLT 223 09/07/2023 1056   MCV 86.0 09/07/2023 1056   MCH 27.0 09/07/2023 1056   MCHC 31.4 09/07/2023 1056   RDW 15.0 09/07/2023 1056   LYMPHSABS 1.3 09/07/2023 1056   MONOABS 0.5 09/07/2023 1056   EOSABS 0.2 09/07/2023 1056   BASOSABS 0.1 09/07/2023 1056   BMET    Component Value Date/Time   NA 140 09/07/2023 1101   K 4.5 09/07/2023 1101   CL 106 09/07/2023 1101   CO2 23 09/07/2023 1056   GLUCOSE 109 (H) 09/07/2023 1101   BUN 22 09/07/2023 1101   CREATININE 1.00 09/07/2023 1101   CALCIUM  9.8 09/07/2023 1056   GFRNONAA >60 09/07/2023 1056   Lab Results  Component Value Date   HGBA1C 6.4 (H) 09/07/2023   Lab Results  Component Value Date   CHOL 162 09/08/2023   HDL 51 09/08/2023   LDLCALC 105 (H) 09/08/2023   TRIG 29 09/08/2023   CHOLHDL 3.2 09/08/2023   IMAGING past 24 hours CT HEAD WO CONTRAST ( ) Result Date: 09/08/2023 CLINICAL DATA:  Stroke follow-up after teen K. EXAM: CT HEAD WITHOUT CONTRAST TECHNIQUE: Contiguous axial images were obtained from the base of the skull through the vertex without intravenous contrast. RADIATION DOSE REDUCTION: This exam was performed according to the departmental dose-optimization program which includes automated exposure control, adjustment of the mA and/or kV according to patient size and/or use of iterative reconstruction technique. COMPARISON:  Brain MRI from yesterday FINDINGS: Brain: Acute infarct in  the left occipital lobe is largely occult. There is an area of possible new cytotoxic edema in the left parietal lobe. Chronic infarcts in the right occipital cortex and bilateral cerebellum. No evidence of new infarct or hemorrhagic conversion. There is chronic small vessel ischemia gliosis which is confluent in the cerebral white matter. No hydrocephalus or masslike finding. Vascular: No hyperdense vessel. Skull: No acute finding Sinuses/Orbits: Sclerosis around the left sphenoid sinus  attributed to chronic inflammation. IMPRESSION: 1. Suspect small interval infarct in the left parietal cortex. Known recent infarct in the left occipital white matter. No acute hemorrhage after TNK. 2. Advanced chronic ischemic injury. Electronically Signed   By: Dorn Roulette M.D.   On: 09/08/2023 10:00   Vitals:   09/08/23 2000 09/08/23 2227 09/09/23 0328 09/09/23 0725  BP: (!) 139/99 (!) 157/97 (!) 160/95 (!) 151/85  Pulse:  92 99 92  Resp: 20 18 18 18   Temp: 98.2 F (36.8 C) 98.8 F (37.1 C) 98.6 F (37 C) 98.6 F (37 C)  TempSrc: Oral Oral Oral Oral  SpO2:  98% 97% 97%  Weight:      Height:       PHYSICAL EXAM General:  Alert, well-nourished, well-developed pleasant elderly Caucasian male in no acute distress Psych:  Mood and affect appropriate for situation, calm and cooperative with exam CV: Regular rate and rhythm on monitor Respiratory:  Regular, unlabored respirations on room air GI: Abdomen soft and nontender  NEURO:  Mental Status: AA&Ox3, improved from yesterday Speech/Language: Very mild dysarthria present, continually improved from days prior.  Naming, repetition, fluency, and comprehension intact.  Cranial Nerves:  II: Improved right lower field of vision deficit, decreased left peripheral field of vision (reported as chronic for one year) III, IV, VI: EOMI. Eyelids elevate symmetrically. No gaze preference.  V: Sensation is intact to light touch and symmetrical to face.  VII: Face is symmetrical resting and smiling VIII: Hearing is intact to voice. IX, X: Palate elevates symmetrically. Phonation is normal.  KP:Dynloizm shrug 5/5. XII: tongue is midline without fasciculations. Motor: 5/5 strength to all muscle groups tested.  Tone: is normal and bulk is normal Sensation-improved sensation to left arm Coordination: FTN intact bilaterally, HKS: no ataxia in BLE.No drift.  Gait: Deferred  Most Recent NIH: 1  ASSESSMENT/PLAN Acute Ischemic Infarct:  left  occipital lobe s/p TNK Etiology:  likely embolic, secondary to small vessel disease Code Stroke CT head No acute abnormality.  ASPECTS 10.    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 Therapy recommendations:  Outpatient PT/OT/ST Disposition:  Pending  Hx of Stroke/TIA Remote infarcts R occipital and bilateral cerebellar seen on MRI Unknown previously  Hypertension Home meds:  Norvasc Stable Blood pressure goal: gradual normotension   Hyperlipidemia Home meds:  lipitor 20mg , changed to Crestor  20mg  LDL 105, goal < 70 Continue statin at discharge  Diabetes type II, Pre-Diabetic Home meds:  none HgbA1c 6.4, goal < 7.0 CBGs SSI Recommend close follow-up with PCP for better DM control  Other Active Problems Rheumatoid arthritis History of prostate cancer HFrEF, newly identified during current admission  LVEF on echo 12/30 is 25-30% with no prior known cardiac disease Consult out to HF team to establish care and close follow up   Hospital day # 2  Pt seen by Neuro NP/APP and later by MD. Note/plan to be edited by MD as needed.    Stevi Toberman, AGACNP-BC [facility] Pager: [phone] I have  personally obtained history,examined this patient, reviewed notes, independently viewed imaging studies, participated in medical decision making and plan of care.ROS completed by me personally and pertinent positives fully documented  I have made any additions or clarifications directly to the above note. Agree with note above.    Eather Popp, MD Medical Director [facility] Pager: [phone] 09/09/2023 5:42 PM  To contact Stroke Continuity provider, please refer to [email or web address]. After hours, contact General Neurology

## 2023-09-09 NOTE — Consult Note (Addendum)
 Cardiology Consultation   Patient ID: Charles Frost MRN: [id number]; DOB: 1946/01/17  Admit date: 09/07/2023 Date of Consult: 09/09/2023  PCP:  Orlando Dwayne NOVAK, FNP   [facility] Providers Cardiologist:  New     Patient Profile:   Charles Frost is a 78 y.o. male with a hx of HTN, rheumatoid arthritis, prostate cancer admitted 09/07/2023 with CVA  who is being seen 09/09/2023 for the evaluation of HFrEF at the request of NP Judithe.  History of Present Illness:   Charles Frost 78 yo male history of HTN, rheumatoid arthritis, prostate cancer, admitted 09/07/23 with acute right sided weakness, slurred speech, and aphasia. Admitted as CODE stroke, received tPA. Imaging consistent with acute left occipital stroke thought to be embolic from small vessel disease. As part of stroke workup echo obtained which showed LVEF 25-30%, new diagnosis for patient. Cardiology consulted to help manage  Patient denies any significant LE edema. Sedentary lifestyle, difficult to distinguish exertional symptoms. He reports 3-4 years has had some DOE that he just attributed to getting older. No orthopnea, no PND.    INR 1 WBC 5.7 Hgb 13.7 Plt 223 K 4.4 Cr 1.08 BUN 16 A1c 6.4 LDL 105 EKG SR, LAFB Echo: LVE 25-30%, global hypokinesis, grade I dd, normal RV   Past Medical History:  Diagnosis Date   GERD (gastroesophageal reflux disease)    Hypercholesterolemia    Hypertension    Prostate cancer (HCC)    Rheumatoid arthritis (HCC)    RA    Past Surgical History:  Procedure Laterality Date   cysto uretero remove stone  2009   cystoscopy insert stent  2009   PROSTATE BIOPSY     ulnar nerve release Right 2008     Inpatient Medications: Scheduled Meds:  aspirin  EC  81 mg Oral Daily   Chlorhexidine  Gluconate Cloth  6 each Topical Daily   clopidogrel   75 mg Oral Daily   enoxaparin  (LOVENOX ) injection  40 mg Subcutaneous Q24H   folic acid   1 mg Oral Daily   oxymetazoline   1 spray Each  Nare BID   pantoprazole  (PROTONIX ) IV  40 mg Intravenous QHS   rosuvastatin   20 mg Oral Daily   sodium chloride  flush  3 mL Intravenous Once   tamsulosin   0.8 mg Oral QPC supper   Continuous Infusions:  PRN Meds: acetaminophen  **OR** acetaminophen  (TYLENOL ) oral liquid 160 mg/5 mL **OR** acetaminophen , senna-docusate  Allergies:   No Known Allergies  Social History:   Social History   Socioeconomic History   Marital status: Single    Spouse name: Not on file   Number of children: 2   Years of education: 20   Highest education level: Not on file  Occupational History   Not on file  Tobacco Use   Smoking status: Former    Current packs/day: 0.00    Average packs/day: 0.3 packs/day for 30.0 years (7.5 ttl pk-yrs)    Types: Cigarettes    Start date: 05/09/1970    Quit date: 05/09/2000    Years since quitting: 23.3   Smokeless tobacco: Never  Substance and Sexual Activity   Alcohol  use: No   Drug use: No   Sexual activity: Yes  Other Topics Concern   Not on file  Social History Narrative   Right handed   Drinks caffeine prn   One story home   Retired    Charles Frost's lives with wife   Social Drivers of Corporate Investment Banker Strain:  Not on file  Food Insecurity: Not on file  Transportation Needs: Not on file  Physical Activity: Not on file  Stress: Not on file  Social Connections: Not on file  Intimate Partner Violence: Not on file    Family History:    Family History  Problem Relation Age of Onset   Cancer Maternal Aunt    Cancer Paternal Grandfather    Dementia Mother      ROS:  Please see the history of present illness.   All other ROS reviewed and negative.     Physical Exam/Data:   Vitals:   09/08/23 2000 09/08/23 2227 09/09/23 0328 09/09/23 0725  BP: (!) 139/99 (!) 157/97 (!) 160/95 (!) 151/85  Pulse:  92 99 92  Resp: 20 18 18 18   Temp: 98.2 F (36.8 C) 98.8 F (37.1 C) 98.6 F (37 C) 98.6 F (37 C)  TempSrc: Oral Oral Oral Oral  SpO2:  98%  97% 97%  Weight:      Height:        Intake/Output Summary (Last 24 hours) at 09/09/2023 1119 Last data filed at 09/08/2023 1400 Gross per 24 hour  Intake 360 ml  Output --  Net 360 ml      09/07/2023   12:48 PM 09/07/2023   11:00 AM 10/08/2022    9:53 AM  Last 3 Weights  Weight (lbs) 177 lb 4 oz 177 lb 4 oz 177 lb  Weight (kg) 80.4 kg 80.4 kg 80.287 kg     Body mass index is 26.95 kg/m.  General:  Well nourished, well developed, in no acute distress HEENT: normal Neck: no JVD Vascular: No carotid bruits; Distal pulses 2+ bilaterally Cardiac:  normal S1, S2; RRR; no murmur  Lungs:  clear to auscultation bilaterally, no wheezing, rhonchi or rales  Abd: soft, nontender, no hepatomegaly  Ext: no edema Musculoskeletal:  No deformities, BUE and BLE strength normal and equal Skin: warm and dry  Neuro:  CNs 2-12 intact, no focal abnormalities noted Psych:  Normal affect     Laboratory Data:  High Sensitivity Troponin:  No results for input(s): TROPONINIHS in the last 720 hours.   Chemistry Recent Labs  Lab 09/07/23 1056 09/07/23 1101  NA 138 140  K 4.4 4.5  CL 105 106  CO2 23  --   GLUCOSE 110* 109*  BUN 16 22  CREATININE 1.08 1.00  CALCIUM  9.8  --   GFRNONAA >60  --   ANIONGAP 10  --     Recent Labs  Lab 09/07/23 1056  PROT 7.2  ALBUMIN 3.8  AST 22  ALT 17  ALKPHOS 50  BILITOT 1.0   Lipids  Recent Labs  Lab 09/08/23 0629  CHOL 162  TRIG 29  HDL 51  LDLCALC 105*  CHOLHDL 3.2    Hematology Recent Labs  Lab 09/07/23 1056 09/07/23 1101  WBC 5.7  --   RBC 5.08  --   HGB 13.7 15.3  HCT 43.7 45.0  MCV 86.0  --   MCH 27.0  --   MCHC 31.4  --   RDW 15.0  --   PLT 223  --    Thyroid  No results for input(s): TSH, FREET4 in the last 168 hours.  BNPNo results for input(s): BNP, PROBNP in the last 168 hours.  DDimer No results for input(s): DDIMER in the last 168 hours.   Radiology/Studies:  CT HEAD WO CONTRAST ( ) Result Date:  09/08/2023 CLINICAL DATA:  Stroke follow-up after  teen K. EXAM: CT HEAD WITHOUT CONTRAST TECHNIQUE: Contiguous axial images were obtained from the base of the skull through the vertex without intravenous contrast. RADIATION DOSE REDUCTION: This exam was performed according to the departmental dose-optimization program which includes automated exposure control, adjustment of the mA and/or kV according to patient size and/or use of iterative reconstruction technique. COMPARISON:  Brain MRI from yesterday FINDINGS: Brain: Acute infarct in the left occipital lobe is largely occult. There is an area of possible new cytotoxic edema in the left parietal lobe. Chronic infarcts in the right occipital cortex and bilateral cerebellum. No evidence of new infarct or hemorrhagic conversion. There is chronic small vessel ischemia gliosis which is confluent in the cerebral white matter. No hydrocephalus or masslike finding. Vascular: No hyperdense vessel. Skull: No acute finding Sinuses/Orbits: Sclerosis around the left sphenoid sinus attributed to chronic inflammation. IMPRESSION: 1. Suspect small interval infarct in the left parietal cortex. Known recent infarct in the left occipital white matter. No acute hemorrhage after TNK. 2. Advanced chronic ischemic injury. Electronically Signed   By: Dorn Roulette M.D.   On: 09/08/2023 10:00   ECHOCARDIOGRAM COMPLETE Result Date: 09/07/2023    ECHOCARDIOGRAM REPORT   Patient Name:   KILIAN SCHWARTZ Date of Exam: 09/07/2023 Medical Rec #:  [id number]      Height:       68.0 in Accession #:    [id number]     Weight:       177.2 lb Date of Birth:  September 10, 1945     BSA:          1.941 m Patient Age:    77 years       BP:           162/91 mmHg Patient Gender: M              HR:           77 bpm. Exam Location:  Inpatient Procedure: 2D Echo, Cardiac Doppler, Color Doppler and Intracardiac            Opacification Agent Indications:    Stroke  History:        Patient has no prior history of  Echocardiogram examinations.                 Risk Factors:Hypertension.  Sonographer:    Ellouise Mose RDCS Referring Phys: [id number] [facility] IMPRESSIONS  1. Left ventricular ejection fraction, by estimation, is 25 to 30%. Left ventricular ejection fraction by PLAX is 34 %. The left ventricle has severely decreased function. The left ventricle demonstrates global hypokinesis. There is mild concentric left  ventricular hypertrophy. Left ventricular diastolic parameters are consistent with Grade I diastolic dysfunction (impaired relaxation).  2. Right ventricular systolic function is normal. The right ventricular size is normal. There is normal pulmonary artery systolic pressure.  3. The mitral valve is normal in structure. Trivial mitral valve regurgitation. No evidence of mitral stenosis.  4. The aortic valve is tricuspid. There is mild calcification of the aortic valve. There is mild thickening of the aortic valve. Aortic valve regurgitation is not visualized. No aortic stenosis is present.  5. The inferior vena cava is normal in size with greater than 50% respiratory variability, suggesting right atrial pressure of 3 mmHg. FINDINGS  Left Ventricle: Left ventricular ejection fraction, by estimation, is 25 to 30%. Left ventricular ejection fraction by PLAX is 34 %. The left ventricle has severely decreased function. The left ventricle demonstrates global hypokinesis. Definity   contrast agent was given IV to delineate the left ventricular endocardial borders. The left ventricular internal cavity size was normal in size. There is mild concentric left ventricular hypertrophy. Left ventricular diastolic parameters are consistent with Grade I diastolic dysfunction (impaired relaxation). Indeterminate filling pressures. Right Ventricle: The right ventricular size is normal. No increase in right ventricular wall thickness. Right ventricular systolic function is normal. There is normal pulmonary artery systolic pressure.  The tricuspid regurgitant velocity is 1.77 m/s, and  with an assumed right atrial pressure of 3 mmHg, the estimated right ventricular systolic pressure is 15.5 mmHg. Left Atrium: Left atrial size was normal in size. Right Atrium: Right atrial size was normal in size. Pericardium: There is no evidence of pericardial effusion. Mitral Valve: The mitral valve is normal in structure. Trivial mitral valve regurgitation. No evidence of mitral valve stenosis. Tricuspid Valve: The tricuspid valve is normal in structure. Tricuspid valve regurgitation is trivial. No evidence of tricuspid stenosis. Aortic Valve: The aortic valve is tricuspid. There is mild calcification of the aortic valve. There is mild thickening of the aortic valve. Aortic valve regurgitation is not visualized. No aortic stenosis is present. Pulmonic Valve: The pulmonic valve was normal in structure. Pulmonic valve regurgitation is not visualized. No evidence of pulmonic stenosis. Aorta: The aortic root is normal in size and structure. Venous: The inferior vena cava is normal in size with greater than 50% respiratory variability, suggesting right atrial pressure of 3 mmHg. IAS/Shunts: No atrial level shunt detected by color flow Doppler.  LEFT VENTRICLE PLAX 2D LV EF:         Left            Diastology                ventricular     LV e' medial:    4.46 cm/s                ejection        LV E/e' medial:  9.9                fraction by     LV e' lateral:   5.66 cm/s                PLAX is 34      LV E/e' lateral: 7.8                %. LVIDd:         4.30 cm LVIDs:         3.60 cm LV PW:         1.10 cm LV IVS:        1.20 cm LVOT diam:     2.60 cm LV SV:         74 LV SV Index:   38 LVOT Area:     5.31 cm  LV Volumes (MOD) LV vol d, MOD    57.7 ml A2C: LV vol d, MOD    84.6 ml A4C: LV vol s, MOD    37.3 ml A2C: LV vol s, MOD    57.3 ml A4C: LV SV MOD A2C:   20.4 ml LV SV MOD A4C:   84.6 ml LV SV MOD BP:    24.3 ml RIGHT VENTRICLE            IVC RV S prime:      5.33 cm/s  IVC diam: 1.90 cm TAPSE (M-mode): 1.3 cm LEFT ATRIUM  Index        RIGHT ATRIUM           Index LA diam:        3.50 cm 1.80 cm/m   RA Area:     11.70 cm LA Vol (A2C):   34.8 ml 17.92 ml/m  RA Volume:   20.70 ml  10.66 ml/m LA Vol (A4C):   39.7 ml 20.45 ml/m LA Biplane Vol: 39.9 ml 20.55 ml/m  AORTIC VALVE LVOT Vmax:   84.40 cm/s LVOT Vmean:  50.800 cm/s LVOT VTI:    0.139 m  AORTA Ao Root diam: 3.00 cm Ao Asc diam:  3.80 cm MITRAL VALVE               TRICUSPID VALVE MV Area (PHT): 4.39 cm    TR Peak grad:   12.5 mmHg MV Decel Time: 173 msec    TR Vmax:        177.00 cm/s MV E velocity: 44.00 cm/s MV A velocity: 92.80 cm/s  SHUNTS MV E/A ratio:  0.47        Systemic VTI:  0.14 m                            Systemic Diam: 2.60 cm Annabella Scarce MD Electronically signed by Annabella Scarce MD Signature Date/Time: 09/07/2023/6:02:19 PM    Final    CT ANGIO HEAD NECK W WO CM (CODE STROKE) Result Date: 09/07/2023 CLINICAL DATA:  Neuro deficit, acute, stroke suspected EXAM: CT ANGIOGRAPHY HEAD AND NECK WITH AND WITHOUT CONTRAST TECHNIQUE: Multidetector CT imaging of the head and neck was performed using the standard protocol during bolus administration of intravenous contrast. Multiplanar CT image reconstructions and MIPs were obtained to evaluate the vascular anatomy. Carotid stenosis measurements (when applicable) are obtained utilizing NASCET criteria, using the distal internal carotid diameter as the denominator. RADIATION DOSE REDUCTION: This exam was performed according to the departmental dose-optimization program which includes automated exposure control, adjustment of the mA and/or kV according to patient size and/or use of iterative reconstruction technique. CONTRAST:  75mL OMNIPAQUE  IOHEXOL  350 MG/ML SOLN COMPARISON:  Same day CT head and MRI head. FINDINGS: CTA NECK FINDINGS Aortic arch: Great vessel origins are patent. Aortic atherosclerosis. Right carotid system: No  evidence of dissection, stenosis (50% or greater), or occlusion. Left carotid system: No evidence of dissection, stenosis (50% or greater), or occlusion. Vertebral arteries: Left dominant. Suspected moderate stenosis of the right vertebral artery origin, although streak artifact limits assessment. Left vertebral artery is patent without significant stenosis. Skeleton: No acute abnormality on limited assessment. Severe lower cervical degenerative disc disease. Other neck: No acute abnormality on limited assessment. Upper chest: Visualized lung apices are clear. Review of the MIP images confirms the above findings CTA HEAD FINDINGS Anterior circulation: Bilateral intracranial ICAs, MCAs, and ACAs are patent without proximal hemodynamically significant stenosis. Posterior circulation: Bilateral intradural vertebral arteries, basilar artery, and bilateral posterior cerebral arteries are patent without proximal hemodynamically significant stenosis. Right fetal type PCA, anatomic variant. Venous sinuses: As permitted by contrast timing, patent. Anatomic variants: Detailed above. Review of the MIP images confirms the above findings IMPRESSION: 1. No emergent large vessel occlusion. 2. Suspected moderate stenosis of the right vertebral artery origin, although streak artifact limits assessment. 3.  Aortic Atherosclerosis (ICD10-I70.0). Electronically Signed   By: Gilmore GORMAN Molt M.D.   On: 09/07/2023 12:10   MR BRAIN WO CONTRAST Result Date: 09/07/2023 CLINICAL DATA:  Neuro deficit, acute, stroke suspected EXAM: MRI HEAD WITHOUT CONTRAST TECHNIQUE: Multiplanar, multiecho pulse sequences of the brain and surrounding structures were obtained without intravenous contrast. COMPARISON:  Same day CT head. FINDINGS: Brain: Small acute infarct in the left periventricular occipital lobe. Slight edema without mass effect. Remote right occipital and bilateral cerebellar infarcts. Moderate to advanced patchy T2/FLAIR  hyperintensity white matter, nonspecific but compatible with chronic microvascular ischemic disease. No evidence of acute hemorrhage, mass lesion, midline shift or hydrocephalus. Vascular: Please see forthcoming CTA. Skull and upper cervical spine: Normal marrow signal. Sinuses/Orbits: Mild paranasal sinus mucosal thickening. IMPRESSION: 1. Small acute infarct in the left periventricular occipital lobe. 2. Remote right occipital and bilateral cerebellar infarcts and moderate to severe chronic microvascular ischemic disease. Findings discussed with Dr. VANESSA via telephone at 11:50 a.m. Electronically Signed   By: Gilmore GORMAN Molt M.D.   On: 09/07/2023 12:00   CT HEAD CODE STROKE WO CONTRAST Result Date: 09/07/2023 CLINICAL DATA:  Code stroke.  Neuro deficit, acute, stroke suspected EXAM: CT HEAD WITHOUT CONTRAST TECHNIQUE: Contiguous axial images were obtained from the base of the skull through the vertex without intravenous contrast. RADIATION DOSE REDUCTION: This exam was performed according to the departmental dose-optimization program which includes automated exposure control, adjustment of the mA and/or kV according to patient size and/or use of iterative reconstruction technique. COMPARISON:  None Available. FINDINGS: Brain: Remote right PCA territory infarct. Remote right cerebellar infarct. Advanced patchy and confluent white matter hypodensities are nonspecific but compatible with chronic microvascular ischemic disease. No evidence of acute large vascular territory infarct, acute hemorrhage, mass lesion, midline shift or hydrocephalus. Vascular: No hyperdense vessel identified. Calcific atherosclerosis. Skull: No acute fracture. Sinuses/Orbits: Mild paranasal sinus mucosal thickening. No acute orbital findings. Other: No mastoid effusions. ASPECTS [facility] Stroke Program Early CT Score) Total score (0-10 with 10 being normal): 10. IMPRESSION: 1. No evidence of acute large vascular territory infarct  or acute hemorrhage. ASPECTS is 10. 2. Remote right PCA territory and remote right cerebellar infarcts with advanced chronic microvascular ischemic change. An MRI could better assess for acute infarct if clinically warranted. Electronically Signed   By: Gilmore GORMAN Molt M.D.   On: 09/07/2023 11:16     Assessment and Plan:   1.Acute HFrEF - new diagnosis this admission. Patient presented with CVA, echo on workup showed new diagnosis of LV systolic dysfunction - Echo: LVEF 25-30%, global hypokinesis, grade I dd, normal RV - he is euvolemic on exam.  - difficult to distinguish symptoms, sedentary lifestyle. Some very gradual progression of DOE over the last 3-4 years he had attributed to getting older and inactivity. No acute progression of symptoms, no orthopnea, PND, or edema.   -start toprol  25mg  daily, entresto  24/26mg  bid. Per patient  plans are for discharge today from neuro perspective which is ok from cardiac standpoint. MRA and SGLT2i can be added at f/u, HF meds titrated over time. Will need bmet at f/u, also a TSH - hold off on cath for patient or recover from his CVA,there is no urgent indication for inpatient study. Reasonable to be arranged as outpatient in a few weeks.  -With new HF meds would not restart his home norvasc at discharge.    2. CVA -management per neurology, has been started on DAPT. He is on statin.    Ok for discharge from a cardiac standpoint, we can continue working on his medication regimen as outpatient. We will arrange outpatient f/u.   For questions or updates, please contact  [facility] Please consult [email or web address] for contact info under    Signed, Alvan Carrier, MD  09/09/2023 11:19 AM

## 2023-09-10 ENCOUNTER — Telehealth: Payer: Self-pay | Admitting: Cardiology

## 2023-09-10 NOTE — Telephone Encounter (Signed)
 Patient saw Dr. Dominga Ferry in the hospital but does not want to travel to [place]. Patient would like to come to [facility], Dr. Jacinto Halim are you OK to see this patient? Dr. Wyline Mood, are you OK with switch?

## 2023-09-10 NOTE — Telephone Encounter (Signed)
I am fine with this.

## 2023-09-15 ENCOUNTER — Other Ambulatory Visit: Payer: Self-pay

## 2023-09-15 ENCOUNTER — Emergency Department (HOSPITAL_COMMUNITY): Payer: Medicare HMO

## 2023-09-15 ENCOUNTER — Inpatient Hospital Stay (HOSPITAL_COMMUNITY): Payer: Medicare HMO

## 2023-09-15 ENCOUNTER — Inpatient Hospital Stay (HOSPITAL_COMMUNITY)
Admission: EM | Admit: 2023-09-15 | Discharge: 2023-09-17 | DRG: 065 | Disposition: A | Discharge status: Home / Self Care | Payer: Medicare HMO | Attending: Family Medicine | Admitting: Family Medicine

## 2023-09-15 ENCOUNTER — Encounter (HOSPITAL_COMMUNITY): Payer: Self-pay | Admitting: Emergency Medicine

## 2023-09-15 DIAGNOSIS — Z9079 Acquired absence of other genital organ(s): Secondary | ICD-10-CM | POA: Diagnosis not present

## 2023-09-15 DIAGNOSIS — I4891 Unspecified atrial fibrillation: Secondary | ICD-10-CM | POA: Diagnosis present

## 2023-09-15 DIAGNOSIS — Z8673 Personal history of transient ischemic attack (TIA), and cerebral infarction without residual deficits: Secondary | ICD-10-CM

## 2023-09-15 DIAGNOSIS — I10 Essential (primary) hypertension: Secondary | ICD-10-CM | POA: Diagnosis not present

## 2023-09-15 DIAGNOSIS — N3289 Other specified disorders of bladder: Secondary | ICD-10-CM | POA: Diagnosis not present

## 2023-09-15 DIAGNOSIS — R110 Nausea: Secondary | ICD-10-CM | POA: Diagnosis not present

## 2023-09-15 DIAGNOSIS — I6782 Cerebral ischemia: Secondary | ICD-10-CM | POA: Diagnosis not present

## 2023-09-15 DIAGNOSIS — R471 Dysarthria and anarthria: Secondary | ICD-10-CM | POA: Diagnosis present

## 2023-09-15 DIAGNOSIS — I63433 Cerebral infarction due to embolism of bilateral posterior cerebral arteries: Secondary | ICD-10-CM | POA: Diagnosis not present

## 2023-09-15 DIAGNOSIS — I63412 Cerebral infarction due to embolism of left middle cerebral artery: Secondary | ICD-10-CM | POA: Diagnosis not present

## 2023-09-15 DIAGNOSIS — I5022 Chronic systolic (congestive) heart failure: Secondary | ICD-10-CM

## 2023-09-15 DIAGNOSIS — Z8546 Personal history of malignant neoplasm of prostate: Secondary | ICD-10-CM

## 2023-09-15 DIAGNOSIS — I639 Cerebral infarction, unspecified: Secondary | ICD-10-CM | POA: Diagnosis not present

## 2023-09-15 DIAGNOSIS — E7800 Pure hypercholesterolemia, unspecified: Secondary | ICD-10-CM | POA: Diagnosis not present

## 2023-09-15 DIAGNOSIS — Q2112 Patent foramen ovale: Secondary | ICD-10-CM

## 2023-09-15 DIAGNOSIS — K5730 Diverticulosis of large intestine without perforation or abscess without bleeding: Secondary | ICD-10-CM | POA: Diagnosis not present

## 2023-09-15 DIAGNOSIS — I959 Hypotension, unspecified: Secondary | ICD-10-CM | POA: Diagnosis not present

## 2023-09-15 DIAGNOSIS — R4781 Slurred speech: Secondary | ICD-10-CM | POA: Diagnosis not present

## 2023-09-15 DIAGNOSIS — I6523 Occlusion and stenosis of bilateral carotid arteries: Secondary | ICD-10-CM | POA: Diagnosis not present

## 2023-09-15 DIAGNOSIS — M069 Rheumatoid arthritis, unspecified: Secondary | ICD-10-CM | POA: Diagnosis present

## 2023-09-15 DIAGNOSIS — Z7902 Long term (current) use of antithrombotics/antiplatelets: Secondary | ICD-10-CM

## 2023-09-15 DIAGNOSIS — R42 Dizziness and giddiness: Secondary | ICD-10-CM | POA: Diagnosis not present

## 2023-09-15 DIAGNOSIS — I110 Hypertensive heart disease with heart failure: Secondary | ICD-10-CM | POA: Diagnosis not present

## 2023-09-15 DIAGNOSIS — Z7982 Long term (current) use of aspirin: Secondary | ICD-10-CM

## 2023-09-15 DIAGNOSIS — I5020 Unspecified systolic (congestive) heart failure: Secondary | ICD-10-CM | POA: Insufficient documentation

## 2023-09-15 DIAGNOSIS — K219 Gastro-esophageal reflux disease without esophagitis: Secondary | ICD-10-CM | POA: Diagnosis present

## 2023-09-15 DIAGNOSIS — R1032 Left lower quadrant pain: Secondary | ICD-10-CM | POA: Diagnosis not present

## 2023-09-15 DIAGNOSIS — R197 Diarrhea, unspecified: Secondary | ICD-10-CM | POA: Diagnosis present

## 2023-09-15 DIAGNOSIS — I6340 Cerebral infarction due to embolism of unspecified cerebral artery: Principal | ICD-10-CM | POA: Diagnosis present

## 2023-09-15 DIAGNOSIS — I429 Cardiomyopathy, unspecified: Secondary | ICD-10-CM | POA: Diagnosis not present

## 2023-09-15 DIAGNOSIS — I679 Cerebrovascular disease, unspecified: Secondary | ICD-10-CM | POA: Diagnosis not present

## 2023-09-15 DIAGNOSIS — R29818 Other symptoms and signs involving the nervous system: Secondary | ICD-10-CM | POA: Diagnosis not present

## 2023-09-15 DIAGNOSIS — I672 Cerebral atherosclerosis: Secondary | ICD-10-CM | POA: Diagnosis not present

## 2023-09-15 DIAGNOSIS — E042 Nontoxic multinodular goiter: Secondary | ICD-10-CM | POA: Diagnosis not present

## 2023-09-15 DIAGNOSIS — Z87891 Personal history of nicotine dependence: Secondary | ICD-10-CM | POA: Diagnosis not present

## 2023-09-15 DIAGNOSIS — Z79899 Other long term (current) drug therapy: Secondary | ICD-10-CM

## 2023-09-15 DIAGNOSIS — N400 Enlarged prostate without lower urinary tract symptoms: Secondary | ICD-10-CM | POA: Diagnosis not present

## 2023-09-15 DIAGNOSIS — Q2111 Secundum atrial septal defect: Secondary | ICD-10-CM

## 2023-09-15 DIAGNOSIS — G4489 Other headache syndrome: Secondary | ICD-10-CM | POA: Diagnosis not present

## 2023-09-15 DIAGNOSIS — I361 Nonrheumatic tricuspid (valve) insufficiency: Secondary | ICD-10-CM | POA: Diagnosis not present

## 2023-09-15 DIAGNOSIS — R29702 NIHSS score 2: Secondary | ICD-10-CM | POA: Diagnosis present

## 2023-09-15 DIAGNOSIS — E119 Type 2 diabetes mellitus without complications: Secondary | ICD-10-CM | POA: Diagnosis not present

## 2023-09-15 LAB — CBG MONITORING, ED: Glucose-Capillary: 122 mg/dL — ABNORMAL HIGH (ref 70–99)

## 2023-09-15 LAB — DIFFERENTIAL
Abs Immature Granulocytes: 0.02 10*3/uL (ref 0.00–0.07)
Basophils Absolute: 0.1 10*3/uL (ref 0.0–0.1)
Basophils Relative: 1 %
Eosinophils Absolute: 0.3 10*3/uL (ref 0.0–0.5)
Eosinophils Relative: 6 %
Immature Granulocytes: 0 %
Lymphocytes Relative: 27 %
Lymphs Abs: 1.4 10*3/uL (ref 0.7–4.0)
Monocytes Absolute: 0.4 10*3/uL (ref 0.1–1.0)
Monocytes Relative: 9 %
Neutro Abs: 2.9 10*3/uL (ref 1.7–7.7)
Neutrophils Relative %: 57 %

## 2023-09-15 LAB — COMPREHENSIVE METABOLIC PANEL
ALT: 20 U/L (ref 0–44)
AST: 24 U/L (ref 15–41)
Albumin: 3.7 g/dL (ref 3.5–5.0)
Alkaline Phosphatase: 47 U/L (ref 38–126)
Anion gap: 10 (ref 5–15)
BUN: 21 mg/dL (ref 8–23)
CO2: 24 mmol/L (ref 22–32)
Calcium: 9.5 mg/dL (ref 8.9–10.3)
Chloride: 106 mmol/L (ref 98–111)
Creatinine, Ser: 1.34 mg/dL — ABNORMAL HIGH (ref 0.61–1.24)
GFR, Estimated: 55 mL/min — ABNORMAL LOW (ref >60)
Glucose, Bld: 124 mg/dL — ABNORMAL HIGH (ref 70–99)
Potassium: 4.7 mmol/L (ref 3.5–5.1)
Sodium: 140 mmol/L (ref 135–145)
Total Bilirubin: 0.7 mg/dL (ref 0.0–1.2)
Total Protein: 6.8 g/dL (ref 6.5–8.1)

## 2023-09-15 LAB — TROPONIN I (HIGH SENSITIVITY)
Troponin I (High Sensitivity): 11 ng/L (ref <18)
Troponin I (High Sensitivity): 16 ng/L (ref <18)

## 2023-09-15 LAB — I-STAT CHEM 8, ED
BUN: 27 mg/dL — ABNORMAL HIGH (ref 8–23)
Calcium, Ion: 0.97 mmol/L — ABNORMAL LOW (ref 1.15–1.40)
Chloride: 107 mmol/L (ref 98–111)
Creatinine, Ser: 1.3 mg/dL — ABNORMAL HIGH (ref 0.61–1.24)
Glucose, Bld: 117 mg/dL — ABNORMAL HIGH (ref 70–99)
HCT: 43 % (ref 39.0–52.0)
Hemoglobin: 14.6 g/dL (ref 13.0–17.0)
Potassium: 4.5 mmol/L (ref 3.5–5.1)
Sodium: 142 mmol/L (ref 135–145)
TCO2: 28 mmol/L (ref 22–32)

## 2023-09-15 LAB — CBC
HCT: 45.6 % (ref 39.0–52.0)
Hemoglobin: 14 g/dL (ref 13.0–17.0)
MCH: 27.3 pg (ref 26.0–34.0)
MCHC: 30.7 g/dL (ref 30.0–36.0)
MCV: 88.9 fL (ref 80.0–100.0)
Platelets: 217 10*3/uL (ref 150–400)
RBC: 5.13 MIL/uL (ref 4.22–5.81)
RDW: 15 % (ref 11.5–15.5)
WBC: 5.1 10*3/uL (ref 4.0–10.5)
nRBC: 0 % (ref 0.0–0.2)

## 2023-09-15 LAB — APTT: aPTT: 25 s (ref 24–36)

## 2023-09-15 LAB — PROTIME-INR
INR: 1 (ref 0.8–1.2)
Prothrombin Time: 13.4 s (ref 11.4–15.2)

## 2023-09-15 LAB — ETHANOL: Alcohol, Ethyl (B): <10 mg/dL (ref <10)

## 2023-09-15 MED ORDER — CLOPIDOGREL BISULFATE 75 MG PO TABS
75.0000 mg | ORAL_TABLET | Freq: Every day | ORAL | Status: DC
  Administered 2023-09-15 – 2023-09-16 (×2): 75 mg via ORAL
  Filled 2023-09-15 (×2): qty 1

## 2023-09-15 MED ORDER — GABAPENTIN 300 MG PO CAPS
300.0000 mg | ORAL_CAPSULE | Freq: Every day | ORAL | Status: DC
  Administered 2023-09-15 – 2023-09-16 (×2): 300 mg via ORAL
  Filled 2023-09-15 (×2): qty 1

## 2023-09-15 MED ORDER — SACUBITRIL-VALSARTAN 24-26 MG PO TABS: 1.0000 | ORAL_TABLET | Freq: Every day | ORAL | Status: DC

## 2023-09-15 MED ORDER — ROSUVASTATIN CALCIUM 20 MG PO TABS
20.0000 mg | ORAL_TABLET | Freq: Every day | ORAL | Status: DC
  Administered 2023-09-15 – 2023-09-17 (×3): 20 mg via ORAL
  Filled 2023-09-15 (×3): qty 1

## 2023-09-15 MED ORDER — ACETAMINOPHEN 650 MG RE SUPP
650.0000 mg | Freq: Four times a day (QID) | RECTAL | Status: DC | PRN
Start: 2023-09-15 — End: 2023-09-17

## 2023-09-15 MED ORDER — SODIUM CHLORIDE 0.9% FLUSH
3.0000 mL | Freq: Two times a day (BID) | INTRAVENOUS | Status: DC
  Administered 2023-09-15: 10 mL via INTRAVENOUS
  Administered 2023-09-16: 3 mL via INTRAVENOUS

## 2023-09-15 MED ORDER — SODIUM CHLORIDE 0.9% FLUSH: 3.0000 mL | INTRAVENOUS | Status: DC | PRN

## 2023-09-15 MED ORDER — IOHEXOL 350 MG/ML SOLN
65.0000 mL | Freq: Once | INTRAVENOUS | Status: AC | PRN
  Administered 2023-09-15: 65 mL via INTRAVENOUS

## 2023-09-15 MED ORDER — METOPROLOL SUCCINATE ER 25 MG PO TB24
25.0000 mg | ORAL_TABLET | Freq: Every day | ORAL | Status: DC
  Administered 2023-09-16 – 2023-09-17 (×2): 25 mg via ORAL
  Filled 2023-09-15 (×2): qty 1

## 2023-09-15 MED ORDER — TAMSULOSIN HCL 0.4 MG PO CAPS
0.8000 mg | ORAL_CAPSULE | Freq: Every day | ORAL | Status: DC
  Administered 2023-09-15 – 2023-09-17 (×3): 0.8 mg via ORAL
  Filled 2023-09-15 (×3): qty 2

## 2023-09-15 MED ORDER — IOHEXOL 350 MG/ML SOLN
75.0000 mL | Freq: Once | INTRAVENOUS | Status: AC | PRN
  Administered 2023-09-15: 75 mL via INTRAVENOUS

## 2023-09-15 MED ORDER — SODIUM CHLORIDE 0.9% FLUSH
3.0000 mL | Freq: Once | INTRAVENOUS | Status: AC
  Administered 2023-09-15: 3 mL via INTRAVENOUS

## 2023-09-15 MED ORDER — ACETAMINOPHEN 325 MG PO TABS: 650.0000 mg | ORAL_TABLET | Freq: Four times a day (QID) | ORAL | Status: DC | PRN

## 2023-09-15 MED ORDER — ASPIRIN 81 MG PO TBEC
81.0000 mg | DELAYED_RELEASE_TABLET | Freq: Every day | ORAL | Status: DC
  Administered 2023-09-15 – 2023-09-16 (×2): 81 mg via ORAL
  Filled 2023-09-15 (×2): qty 1

## 2023-09-15 NOTE — Assessment & Plan Note (Addendum)
 Last Echo in 09/07/23 with EF 25-30%. Home meds include Entresto, metoprolol. Patient appears euvolemic at this time.  - cont home metoprolol 25 daily tomorrow  - strict I/o - daily weight

## 2023-09-15 NOTE — H&P (Addendum)
 Hospital Admission History and Physical Service Pager: [phone]  Patient name: Charles Frost Medical record number: [id number] Date of Birth: 06/16/1946 Age: 78 y.o. Gender: male  Primary Care Provider: Orlando Dwayne NOVAK, FNP Consultants: Neurology Code Status: FULL  Preferred Emergency Contact: Charles Frost (spouse): [phone]  Chief Complaint: Stroke  Assessment and Plan: Charles Frost is a 78 y.o. male with pmh recent CVA, HFrEF, HTN presenting with dizziness, slurred speech, sluggish. Differential for presentation of this includes:   Ddx for dizzinesspresyncopal event:  -MR Brain indicates new stroke acute cortical infarction in the posterior left parietal lobe which likely is contributing to new onset symptoms. -Cardiac causes with concern for possibility of an arrhythmia, also could be causing emboli to the brain. -Unlikely seizure disorder as the patient has no history and has no symptoms of seizures prior to onset of syncopal event although it was unwitnessed -Vestibular dysfunction also less likely -Does not appear to be drug-induced -Orthostatic measurements will be taken to rule out orthostatic hypotension -No evidence of electrolyte abnormalities or hypoglycemia -CXR and UA ordered for further infectious work up per neurology   Assessment & Plan Stroke [facility]) See above for MR findings. Neurology to consult Cardiology for possible cardiac etiology workup. Patient HDS at this time.  - Admit to FMTS, med tele with Dr. Jeanelle  - Telemetry - c/s neuro stroke team, appreciate recs - TEE to be discussed with cardiology per neuro note  - ASA 81, Plavix  75 daily  - Neuro checks q2 - c/s SLP, PT, OT - Permissive HTN?? - AM BMP, CBC, mag HFrEF (heart failure with reduced ejection fraction) (HCC) Last Echo in 09/07/23 with EF 25-30%. Home meds include Entresto , metoprolol . Patient appears euvolemic at this time.  - cont home metoprolol  25 daily tomorrow  -  strict I/o - daily weight  Chronic and Stable Problems:  HTN - hold entresto  for now  HLD -  cont home Crestor  20 daily  Rheumatoid Arthritis - cont home gabapentin  300 nightly  Prediabetes - monitor H/o Prostate Cancer - cont home Flomax  (history of cancer//hypercoagulable state?) GERD  FEN/GI: Regular diet VTE Prophylaxis: Presently on DAPT, consider AC per neuro note   Disposition: Med tele   History of Present Illness:  Charles Frost is a 78 y.o. male presenting with dizziness and diaphoretic event.  Onset earlier today on the way to physical therapy. Wife found patient slumped over in chair, with sluggish speech. No true syncope noted by significant other, no falls or head injury. Was wobbly when trying to ambulate, needed assistance going to the bathroom. Had diarrhea today and while in the bathroom, felt something was wrong like it was related to a new stroke. Patient reports diaphoresis as well during the event. Denies chest pain, dyspnea, fever, chills.   LKN around 1900 09/14/23, was at his new baseline since last stroke yesterday. Reports a frontal headache last night, feels better now. Also reports abdominal pain onset a couple of months ago, felt bloated intermittently. Had hematochezia in the past with hemorrhoids, not normally now. No tarry stools. No N/V.   In the ED, patient HDS, satting well on room air. CTHead and CTA unremarkable. Brain MRI with new small infarcts of left occipital region. Neurology consulted, patient admitted for further cardiac workup.   Recent Hospitalization History: Admitted to Neurology Service 09/07/23-09/09/23 for stroke--tiny left occipital lobe stroke and TNKase  was administered. Given DAPT at dispo.   Review Of Systems: Per HPI with  the following additions: See Above - denies nausea or vomiting   Pertinent Past Medical History: HTN HLD  Rheumatoid Arthritis  Prediabetes  H/o Prostate Cancer with TURP, ADT, & radiation therapy   GERD Recent CVA  Remainder reviewed in history tab.   Pertinent Past Surgical History: Ureteral stent and stone removal  TURP  Ulnar nerve released Remainder reviewed in history tab.   Pertinent Social History: Tobacco use: No Alcohol  use: No Other Substance use: No Lives with wife   Pertinent Family History: Cancer - aunt, grandfather Dementia - mother  Remainder reviewed in history tab.   Important Outpatient Medications: Humira 40 twice a month Aspirin  81 daily  Plavix  75 daily  Gabapentin  300 nightly   Remainder reviewed in medication history.   Objective: BP 122/76   Pulse 69   Temp 97.6 F (36.4 C) (Oral)   Resp (!) 26   SpO2 99%  Exam: General: No acute distress.  Resting comfortably in room. Eyes: Sclera without injection or icterus bilaterally. Neck: Supple. Cardiovascular: Normal S1/S2.  No extra heart sounds.  Warm and well-perfused. Respiratory: Breathing comfortably on room air.  CTAB.  No increased WOB. Gastrointestinal: BS present.  Soft.  Nondistended.  Nontender. MSK: No extremity swelling or tenderness. Derm: Warm.  Dry. Neuro: AO x4.  5/5 extremity strength bilaterally.  Sensation intact and equal bilaterally.  Normal rapid alternating movements.  Normal finger-to-nose exam. Cranial exam: CN2: no new vision changes CN3,4,6: PERRLA. EOMI CN5: Sensation intact BL CN7: Facial expressions symmetric CN8: Hearing intact BL CN10: regular speech CN11: turns head against resistance equally CN12: tongue midline Psych: Pleasant and appropriate.  Labs:  CBC BMET  Recent Labs  Lab 09/15/23 0828 09/15/23 0833  WBC 5.1  --   HGB 14.0 14.6  HCT 45.6 43.0  PLT 217  --    Recent Labs  Lab 09/15/23 0828 09/15/23 0833  NA 140 142  K 4.7 4.5  CL 106 107  CO2 24  --   BUN 21 27*  CREATININE 1.34* 1.30*  GLUCOSE 124* 117*  CALCIUM  9.5  --      EKG:   NSR, no acute change from previous,   Imaging Studies Performed:  MR BRAIN WO  CONTRAST Result Date: 09/15/2023 IMPRESSION: 1. Newly seen cluster of small acute infarctions affecting the cortical brain in the left occipital region. Previously seen acute infarction just anterior to that does not show any progression. Newly seen foci of acute cortical infarction in the posterior left parietal lobe. Findings consistent with recurrent embolic infarctions. 2. Advanced chronic small-vessel ischemic changes elsewhere in the brain as outlined above.  CT ABDOMEN PELVIS W CONTRAST Result Date: 09/15/2023  IMPRESSION: 1. Examination is degraded by motion. 2. No acute inflammatory process identified within the abdomen or pelvis. 3. There are 2, slowly growing subcapsular hypoattenuating structures in the right hepatic lobe, segments 5/6, not well evaluated on this exam. Consider nonemergent contrast-enhanced MRI abdomen as per liver mass protocol for better characterization. 4. Multiple other nonacute observations, as described above  CT HEAD CODE STROKE WO CONTRAST Result Date: 09/15/2023  IMPRESSION: Non-contrast head CT: 1. 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 2. No acute intracranial hemorrhage or interval acute demarcated cortical infarct. 3. Background advanced chronic small vessel ischemic disease. 4. Left sphenoid sinusitis. CTA neck: 1. The common carotid, internal carotid and vertebral arteries are patent within the neck without stenosis. Minimal atherosclerotic plaque within the proximal right ICA. Non-stenotic atherosclerotic  plaque at the right vertebral artery origin. 2. Aortic Atherosclerosis (ICD10-I70.0). CTA head: 1. No emergent large vessel occlusion identified. 2. Non-stenotic atherosclerotic plaque within the intracranial internal carotid arteries. CT perfusion head: The perfusion software identifies no core infarct. The perfusion software identifies no critically hypoperfused parenchyma (utilizing the Tmax>6 seconds  threshold). No mismatch volume   CT ANGIO HEAD NECK W WO CM W PERF (CODE STROKE) Result Date: 09/15/2023  IMPRESSION: Non-contrast head CT: 1. 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 2. No acute intracranial hemorrhage or interval acute demarcated cortical infarct. 3. Background advanced chronic small vessel ischemic disease. 4. Left sphenoid sinusitis. CTA neck: 1. The common carotid, internal carotid and vertebral arteries are patent within the neck without stenosis. Minimal atherosclerotic plaque within the proximal right ICA. Non-stenotic atherosclerotic plaque at the right vertebral artery origin. 2. Aortic Atherosclerosis (ICD10-I70.0). CTA head: 1. No emergent large vessel occlusion identified. 2. Non-stenotic atherosclerotic plaque within the intracranial internal carotid arteries. CT perfusion head: The perfusion software identifies no core infarct. The perfusion software identifies no critically hypoperfused parenchyma (utilizing the Tmax>6 seconds threshold). No mismatch volume reported.    Diona Perkins, MD 09/15/2023, 12:24 PM PGY-3, [facility]  FPTS Intern pager: [phone], text pages welcome Secure chat group [facility] Teaching Service   Upper Level Addendum:  I have seen and evaluated this patient along with Dr. Diona and reviewed the above note, making necessary revisions as appropriate.  I agree with the medical decision making and physical exam as noted above.  Laurier Hugger, MD PGY-3 [facility]

## 2023-09-15 NOTE — ED Triage Notes (Signed)
 Pt BIB [facility] as a Code Stroke.  PT was evaluated for stroke on Monday and DC Tuesday. Per EMS, pt's wife was getting him out of bed to go to rehab when he slumped over to the right.  He did not loose consciousness but he did have slurred speech.  EMS also noted slurred speech and generalized weakness.  Pt states he was dizzy with HA  & nausea at that time.   140/80 100% HR 70 RR 18 CBG 122

## 2023-09-15 NOTE — ED Notes (Signed)
 Patient transported to MRI

## 2023-09-15 NOTE — Code Documentation (Addendum)
 Charles Frost is a 78 yr old male with a past medical history of stroke on 09/07/2023, heart failure and HTN arriving to [facility] via [facility] on 09/15/2023. Pt is coming from home where he was last known well last night at 20:00. This morning when he got up, he was dizzy and nauseated and unbalanced. EMS found him dysarthric and leaning to the right, and Code Stroke was activated. Pt is on Aspirin  and Plavix .     Pt met by stroke team at the bridge. CBG, labs obtained, airway cleared by EDP. Pt to CT with team. NIHSS 2. Pt with subtle rt mouth droop, and mild dysarthria. The following imaging was obtained: CT, CTA/P. Per Dr. Voncile, CT is negative for hemorrhage, and CTA is negative for LVO, as well as perfusion abnormality.    Pt back to ED room 31 where his workup will continue. He will need q 2 hour VS and NIHSS for 12 hours, then q 4. NPO until stroke swallow screen passed. Pt is ineligible for thrombolytics as he is outside of the treatment window. He is not a candidate for thrombectomy as he is LVO negative. Bedside handoff with Josh RN complete.

## 2023-09-15 NOTE — ED Notes (Signed)
 ED TO INPATIENT HANDOFF REPORT  ED Nurse Name and Phone #: [phone]  S Name/Age/Gender Charles Frost 78 y.o. male Room/Bed: 039C/039C  Code Status   Code Status: Full Code  Home/SNF/Other Home Patient oriented to: self, place, time, and situation Is this baseline? Yes   Triage Complete: Triage complete  Chief Complaint Stroke [facility]) [I63.9]  Triage Note Pt BIB [facility] as a Code Stroke.  PT was evaluated for stroke on Monday and DC Tuesday. Per EMS, pt's wife was getting him out of bed to go to rehab when he slumped over to the right.  He did not loose consciousness but he did have slurred speech.  EMS also noted slurred speech and generalized weakness.  Pt states he was dizzy with HA  & nausea at that time.   140/80 100% HR 70 RR 18 CBG 122    Allergies No Known Allergies  Level of Care/Admitting Diagnosis ED Disposition     ED Disposition  Admit   Condition  --   Comment  Hospital Area: [facility] [[id number]]  Level of Care: Telemetry Medical [104]  May admit patient to [facility] or [facility] if equivalent level of care is available:: Yes  Covid Evaluation: Asymptomatic - no recent exposure (last 10 days) testing not required  Diagnosis: Stroke [facility]) [[id number]]  Admitting Physician: BRYAN BIANCHI [[id number]]  Attending Physician: JEANELLE LAYMAN CROME [phone]  Certification:: I certify this patient will need inpatient services for at least 2 midnights          B Medical/Surgery History Past Medical History:  Diagnosis Date   GERD (gastroesophageal reflux disease)    Hypercholesterolemia    Hypertension    Prostate cancer (HCC)    Rheumatoid arthritis (HCC)    RA   Past Surgical History:  Procedure Laterality Date   cysto uretero remove stone  2009   cystoscopy insert stent  2009   PROSTATE BIOPSY     ulnar nerve release Right 2008     A IV Location/Drains/Wounds Patient Lines/Drains/Airways Status     Active  Line/Drains/Airways     Name Placement date Placement time Site Days   Peripheral IV 09/15/23 18 G Anterior;Distal;Right;Upper Arm 09/15/23  0813  Arm  less than 1   Peripheral IV 09/15/23 18 G Anterior;Distal;Left;Upper Arm 09/15/23  0813  Arm  less than 1            Intake/Output Last 24 hours No intake or output data in the 24 hours ending 09/15/23 1657  Labs/Imaging Results for orders placed or performed during the hospital encounter of 09/15/23 (from the past 48 hours)  Troponin I (High Sensitivity)     Status: None   Collection Time: 09/15/23  8:25 AM  Result Value Ref Range   Troponin I (High Sensitivity) 11 <18 ng/L    Comment: (NOTE) Elevated high sensitivity troponin I (hsTnI) values and significant  changes across serial measurements may suggest ACS but many other  chronic and acute conditions are known to elevate hsTnI results.  Refer to the Links section for chest pain algorithms and additional  guidance. Performed at [facility], [address] [place], [place] [address]   CBG monitoring, ED     Status: Abnormal   Collection Time: 09/15/23  8:26 AM  Result Value Ref Range   Glucose-Capillary 122 (H) 70 - 99 mg/dL    Comment: Glucose reference range applies only to samples taken after fasting for at least 8 hours.  Protime-INR     Status: None   Collection Time: 09/15/23  8:28 AM  Result Value Ref Range   Prothrombin Time 13.4 11.4 - 15.2 seconds   INR 1.0 0.8 - 1.2    Comment: (NOTE) INR goal varies based on device and disease states. Performed at [facility], [address], [place], [place] [address]   APTT     Status: None   Collection Time: 09/15/23  8:28 AM  Result Value Ref Range   aPTT 25 24 - 36 seconds    Comment: Performed at [facility], [address], [place], [place] [address]  CBC     Status: None   Collection Time: 09/15/23  8:28 AM  Result Value Ref Range   WBC 5.1 4.0 - 10.5 K/uL   RBC 5.13 4.22 - 5.81  MIL/uL   Hemoglobin 14.0 13.0 - 17.0 g/dL   HCT 54.3 60.9 - 47.9 %   MCV 88.9 80.0 - 100.0 fL   MCH 27.3 26.0 - 34.0 pg   MCHC 30.7 30.0 - 36.0 g/dL   RDW 84.9 88.4 - 84.4 %   Platelets 217 150 - 400 K/uL   nRBC 0.0 0.0 - 0.2 %    Comment: Performed at [facility], [address], [place], [place] [address]  Differential     Status: None   Collection Time: 09/15/23  8:28 AM  Result Value Ref Range   Neutrophils Relative % 57 %   Neutro Abs 2.9 1.7 - 7.7 K/uL   Lymphocytes Relative 27 %   Lymphs Abs 1.4 0.7 - 4.0 K/uL   Monocytes Relative 9 %   Monocytes Absolute 0.4 0.1 - 1.0 K/uL   Eosinophils Relative 6 %   Eosinophils Absolute 0.3 0.0 - 0.5 K/uL   Basophils Relative 1 %   Basophils Absolute 0.1 0.0 - 0.1 K/uL   Immature Granulocytes 0 %   Abs Immature Granulocytes 0.02 0.00 - 0.07 K/uL    Comment: Performed at [facility], [address], [place], [place] [address]  Comprehensive metabolic panel     Status: Abnormal   Collection Time: 09/15/23  8:28 AM  Result Value Ref Range   Sodium 140 135 - 145 mmol/L   Potassium 4.7 3.5 - 5.1 mmol/L   Chloride 106 98 - 111 mmol/L   CO2 24 22 - 32 mmol/L   Glucose, Bld 124 (H) 70 - 99 mg/dL    Comment: Glucose reference range applies only to samples taken after fasting for at least 8 hours.   BUN 21 8 - 23 mg/dL   Creatinine, Ser 8.65 (H) 0.61 - 1.24 mg/dL   Calcium  9.5 8.9 - 10.3 mg/dL   Total Protein 6.8 6.5 - 8.1 g/dL   Albumin 3.7 3.5 - 5.0 g/dL   AST 24 15 - 41 U/L   ALT 20 0 - 44 U/L   Alkaline Phosphatase 47 38 - 126 U/L   Total Bilirubin 0.7 0.0 - 1.2 mg/dL   GFR, Estimated 55 (L) >60 mL/min    Comment: (NOTE) Calculated using the CKD-EPI Creatinine Equation (2021)    Anion gap 10 5 - 15    Comment: Performed at [facility], [address] [place], [place] [address]  Ethanol     Status: None   Collection Time: 09/15/23  8:28 AM  Result Value Ref Range   Alcohol , Ethyl (B) <10 <10 mg/dL     Comment: (  NOTE) Lowest detectable limit for serum alcohol  is 10 mg/dL.  For medical purposes only. Performed at [facility], [address] [place], [place] [address]   I-stat chem 8, ED     Status: Abnormal   Collection Time: 09/15/23  8:33 AM  Result Value Ref Range   Sodium 142 135 - 145 mmol/L   Potassium 4.5 3.5 - 5.1 mmol/L   Chloride 107 98 - 111 mmol/L   BUN 27 (H) 8 - 23 mg/dL   Creatinine, Ser 8.69 (H) 0.61 - 1.24 mg/dL   Glucose, Bld 882 (H) 70 - 99 mg/dL    Comment: Glucose reference range applies only to samples taken after fasting for at least 8 hours.   Calcium , Ion 0.97 (L) 1.15 - 1.40 mmol/L   TCO2 28 22 - 32 mmol/L   Hemoglobin 14.6 13.0 - 17.0 g/dL   HCT 56.9 60.9 - 47.9 %  Troponin I (High Sensitivity)     Status: None   Collection Time: 09/15/23 11:33 AM  Result Value Ref Range   Troponin I (High Sensitivity) 16 <18 ng/L    Comment: (NOTE) Elevated high sensitivity troponin I (hsTnI) values and significant  changes across serial measurements may suggest ACS but many other  chronic and acute conditions are known to elevate hsTnI results.  Refer to the Links section for chest pain algorithms and additional  guidance. Performed at [facility], [address] [place], [place] [address]    DG Chest 1 View Result Date: 09/15/2023 CLINICAL DATA:  Dizziness. EXAM: CHEST  1 VIEW COMPARISON:  None Available. FINDINGS: No focal consolidation, pleural effusion, or pneumothorax. The cardiac silhouette is within normal limits. No acute osseous pathology. IMPRESSION: No active disease. Electronically Signed   By: Vanetta Chou M.D.   On: 09/15/2023 14:38   MR BRAIN WO CONTRAST Result Date: 09/15/2023 CLINICAL DATA:  Neuro deficit, acute, stroke suspected. EXAM: MRI HEAD WITHOUT CONTRAST TECHNIQUE: Multiplanar, multiecho pulse sequences of the brain and surrounding structures were obtained without intravenous contrast. COMPARISON:  CT studies earlier  same day.  MRI 09/07/2023. FINDINGS: Brain: Newly seen cluster of small acute infarctions affecting the cortical brain in the left occipital region. Previously seen acute infarction just anterior to that does not show any progression. Newly seen foci of acute cortical infarction in the posterior left parietal lobe. Findings consistent with recurrent embolic infarctions. Chronic small-vessel ischemic changes remain evident within the pons. Numerous old small vessel cerebellar infarctions. Old infarction of the right occipital cortical and subcortical brain. Advanced chronic small-vessel ischemic changes throughout the cerebral hemispheric white matter. No evidence of acute hemorrhage, hydrocephalus or extra-axial collection. Chronic hemosiderin deposition associated with many of the old infarctions. Vascular: Major vessels at the base of the brain show flow. Skull and upper cervical spine: Negative Sinuses/Orbits: Clear/normal Other: None IMPRESSION: 1. Newly seen cluster of small acute infarctions affecting the cortical brain in the left occipital region. Previously seen acute infarction just anterior to that does not show any progression. Newly seen foci of acute cortical infarction in the posterior left parietal lobe. Findings consistent with recurrent embolic infarctions. 2. Advanced chronic small-vessel ischemic changes elsewhere in the brain as outlined above. Electronically Signed   By: Oneil Officer M.D.   On: 09/15/2023 12:09   CT ABDOMEN PELVIS W CONTRAST Result Date: 09/15/2023 CLINICAL DATA:  LLQ abdominal pain. EXAM: CT ABDOMEN AND PELVIS WITH CONTRAST TECHNIQUE: Multidetector CT imaging of the abdomen and pelvis was performed  using the standard protocol following bolus administration of intravenous contrast. RADIATION DOSE REDUCTION: This exam was performed according to the departmental dose-optimization program which includes automated exposure control, adjustment of the mA and/or kV according to  patient size and/or use of iterative reconstruction technique. CONTRAST:  65mL OMNIPAQUE  IOHEXOL  350 MG/ML SOLN COMPARISON:  CT scan renal stone protocol 12/04/2016. FINDINGS: Examination is suboptimal due to patient's motion during data acquisition. Lower chest: The lung bases are clear. No pleural effusion. The heart is normal in size. No pericardial effusion. Hepatobiliary: The liver is normal in size. Non-cirrhotic configuration. No suspicious mass. Redemonstration of 2 subcapsular hypoattenuating structures in the right hepatic lobe, segments 5/6 which are not well evaluated due to extensive motion at these levels. However, these lesions were smaller but present on the prior examination from 2018. Consider further evaluation with nonemergent contrast-enhanced MRI abdomen as per liver mass protocol. No intrahepatic or extrahepatic bile duct dilation. No calcified gallstones. Normal gallbladder wall thickness. No pericholecystic inflammatory changes. Pancreas: Small focus of sub 5 mm dystrophic calcification in the uncinate process is nonspecific but can be seen as a sequela of previous episodes of chronic pancreatitis. Otherwise no suspicious pancreatic lesion. No pancreatic ductal dilatation or surrounding inflammatory changes. Spleen: Within normal limits. No focal lesion. Adrenals/Urinary Tract: Limited evaluation due to extensive motion at this level. Multiple areas of scarring noted in the left kidney. There is excreted contrast in bilateral renal collecting systems, which limits evaluation for nonobstructing calculi. No hydroureteronephrosis on either side. Urinary bladder is under distended, precluding optimal assessment. However, no large mass or stones identified. No perivesical fat stranding. Stomach/Bowel: No disproportionate dilation of the small or large bowel loops. No evidence of abnormal bowel wall thickening or inflammatory changes. The appendix is unremarkable. There are multiple diverticula  throughout the colon, without imaging signs of diverticulitis. Vascular/Lymphatic: No ascites or pneumoperitoneum. No abdominal or pelvic lymphadenopathy, by size criteria. No aneurysmal dilation of the major abdominal arteries. There are moderate peripheral atherosclerotic vascular calcifications of the aorta and its major branches. Reproductive: Enlarged prostate. Symmetric seminal vesicles. Other: There are small fat containing umbilical and left inguinal hernias. The soft tissues and abdominal wall are otherwise unremarkable. Musculoskeletal: No suspicious osseous lesions. There are mild multilevel degenerative changes in the visualized spine. IMPRESSION: 1. Examination is degraded by motion. 2. No acute inflammatory process identified within the abdomen or pelvis. 3. There are 2, slowly growing subcapsular hypoattenuating structures in the right hepatic lobe, segments 5/6, not well evaluated on this exam. Consider nonemergent contrast-enhanced MRI abdomen as per liver mass protocol for better characterization. 4. Multiple other nonacute observations, as described above. Electronically Signed   By: Ree Molt M.D.   On: 09/15/2023 10:53   CT HEAD CODE STROKE WO CONTRAST Result Date: 09/15/2023 CLINICAL DATA:  Provided history: Neuro deficit, acute, stroke suspected. EXAM: CT ANGIOGRAPHY HEAD AND NECK CT PERFUSION BRAIN TECHNIQUE: Multidetector CT imaging of the head and neck was performed using the standard protocol during bolus administration of intravenous contrast. Multiplanar CT image reconstructions and MIPs were obtained to evaluate the vascular anatomy. Carotid stenosis measurements (when applicable) are obtained utilizing NASCET criteria, using the distal internal carotid diameter as the denominator. Multiphase CT imaging of the brain was performed following IV bolus contrast injection. Subsequent parametric perfusion maps were calculated using RAPID software. RADIATION DOSE REDUCTION: This exam  was performed according to the departmental dose-optimization program which includes automated exposure control, adjustment of the mA and/or kV according to  patient size and/or use of iterative reconstruction technique. CONTRAST:  Administered contrast not known at this time. COMPARISON:  Noncontrast head CT 09/08/2023. CT angiogram head/neck 09/07/2023. Brain MRI 09/07/2023. FINDINGS: CT HEAD FINDINGS Brain: No age advanced or lobar predominant parenchymal atrophy. Known small acute/subacute cortical/subcortical infarcts within the left parietal and left occipital lobes. Chronic cortical/subcortical infarct within the right occipital lobe. Chronic lacunar infarct within the left parietal white matter. Background advanced patchy and ill-defined hypoattenuation within the cerebral white matter, nonspecific but compatible chronic small vessel disease. Small chronic infarcts within the bilateral cerebellar hemispheres. No interval acute demarcated cortical infarct is identified. No acute intracranial hemorrhage. No evidence of an intracranial mass. No chronic intracranial blood products. No extra-axial fluid collection. No midline shift. Vascular: No hyperdense vessel. Atherosclerotic calcifications. Skull: No calvarial fracture or aggressive osseous lesion. Sinuses/Orbits: No orbital mass or acute orbital finding. Small volume frothy secretions within the left sphenoid sinus. ASPECTS (Alberta Stroke Program Early CT Score) - Ganglionic level infarction (caudate, lentiform nuclei, internal capsule, insula, M1-M3 cortex): 7 - Supraganglionic infarction (M4-M6 cortex): 3 Total score (0-10 with 10 being normal): 10 (when discounting the previously demonstrated, known acute/subacute cortically-based infarcts described above). No evidence of interval acute intracranial abnormality. These results were communicated to Dr. Arora At 8:51 amon 1/7/2025by text page via the [facility] messaging system. Review of the MIP images confirms  the above findings CTA NECK FINDINGS Aortic arch: Standard aortic branching. Atherosclerotic plaque within the visualized thoracic aorta and proximal major branch vessels of the neck. Streak/beam hardening artifact arising from a dense left-sided contrast bolus partially obscures the left subclavian artery. Within this limitation, there is no appreciable hemodynamically significant innominate or proximal subclavian artery stenosis. Right carotid system: CCA and ICA patent within the neck without stenosis. Minimal atherosclerotic plaque within the proximal ICA. Partially retropharyngeal course of the cervical ICA. Left carotid system: CCA and ICA patent within the neck without stenosis or significant atherosclerotic disease. Vertebral arteries: Codominant and patent within the neck without stenosis. Nonstenotic atherosclerotic plaque at the right vertebral artery origin. Skeleton: Cervical spondylosis. No acute fracture or aggressive osseous lesion. Other neck: No neck mass or cervical lymphadenopathy. Subcentimeter thyroid  nodules not meeting consensus criteria for ultrasound follow-up based on size. No follow-up imaging recommended. Reference: J Am Coll Radiol. 2015 Feb;12(2): 143-50. Upper chest: No consolidation within the imaged lung apices. Review of the MIP images confirms the above findings CTA HEAD FINDINGS Anterior circulation: The intracranial internal carotid arteries are patent. Nonstenotic atherosclerotic plaque within both vessels The M1 middle cerebral arteries are patent. No M2 proximal branch occlusion or high-grade proximal stenosis. The anterior cerebral arteries are patent. No intracranial aneurysm is identified. Posterior circulation: The intracranial vertebral arteries are patent. The basilar artery is patent. The posterior cerebral arteries are patent. Hypoplastic right P1 segment with sizable right posterior communicating artery. The left posterior communicating artery is diminutive or  absent. Venous sinuses: Within the limitations of contrast timing, no convincing thrombus. Anatomic variants: As described. Review of the MIP images confirms the above findings No emergent large vessel occlusion identified. These results were communicated to Dr. Voncile at 9:04 amon 1/7/2025by text page via the [facility] messaging system. CT Brain Perfusion Findings: CBF (<30%) Volume: 0mL Perfusion (Tmax>6.0s) volume: 0mL Mismatch Volume: 0mL Infarction Location:None identified via perfusion data. IMPRESSION: Non-contrast head CT: 1. 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 2. No acute intracranial hemorrhage or interval acute demarcated cortical infarct. 3. Background advanced  chronic small vessel ischemic disease. 4. Left sphenoid sinusitis. CTA neck: 1. The common carotid, internal carotid and vertebral arteries are patent within the neck without stenosis. Minimal atherosclerotic plaque within the proximal right ICA. Non-stenotic atherosclerotic plaque at the right vertebral artery origin. 2. Aortic Atherosclerosis (ICD10-I70.0). CTA head: 1. No emergent large vessel occlusion identified. 2. Non-stenotic atherosclerotic plaque within the intracranial internal carotid arteries. CT perfusion head: The perfusion software identifies no core infarct. The perfusion software identifies no critically hypoperfused parenchyma (utilizing the Tmax>6 seconds threshold). No mismatch volume reported. Electronically Signed   By: Rockey Childs D.O.   On: 09/15/2023 09:07   CT ANGIO HEAD NECK W WO CM W PERF (CODE STROKE) Result Date: 09/15/2023 CLINICAL DATA:  Provided history: Neuro deficit, acute, stroke suspected. EXAM: CT ANGIOGRAPHY HEAD AND NECK CT PERFUSION BRAIN TECHNIQUE: Multidetector CT imaging of the head and neck was performed using the standard protocol during bolus administration of intravenous contrast. Multiplanar CT image reconstructions and MIPs were  obtained to evaluate the vascular anatomy. Carotid stenosis measurements (when applicable) are obtained utilizing NASCET criteria, using the distal internal carotid diameter as the denominator. Multiphase CT imaging of the brain was performed following IV bolus contrast injection. Subsequent parametric perfusion maps were calculated using RAPID software. RADIATION DOSE REDUCTION: This exam was performed according to the departmental dose-optimization program which includes automated exposure control, adjustment of the mA and/or kV according to patient size and/or use of iterative reconstruction technique. CONTRAST:  Administered contrast not known at this time. COMPARISON:  Noncontrast head CT 09/08/2023. CT angiogram head/neck 09/07/2023. Brain MRI 09/07/2023. FINDINGS: CT HEAD FINDINGS Brain: No age advanced or lobar predominant parenchymal atrophy. Known small acute/subacute cortical/subcortical infarcts within the left parietal and left occipital lobes. Chronic cortical/subcortical infarct within the right occipital lobe. Chronic lacunar infarct within the left parietal white matter. Background advanced patchy and ill-defined hypoattenuation within the cerebral white matter, nonspecific but compatible chronic small vessel disease. Small chronic infarcts within the bilateral cerebellar hemispheres. No interval acute demarcated cortical infarct is identified. No acute intracranial hemorrhage. No evidence of an intracranial mass. No chronic intracranial blood products. No extra-axial fluid collection. No midline shift. Vascular: No hyperdense vessel. Atherosclerotic calcifications. Skull: No calvarial fracture or aggressive osseous lesion. Sinuses/Orbits: No orbital mass or acute orbital finding. Small volume frothy secretions within the left sphenoid sinus. ASPECTS (Alberta Stroke Program Early CT Score) - Ganglionic level infarction (caudate, lentiform nuclei, internal capsule, insula, M1-M3 cortex): 7 -  Supraganglionic infarction (M4-M6 cortex): 3 Total score (0-10 with 10 being normal): 10 (when discounting the previously demonstrated, known acute/subacute cortically-based infarcts described above). No evidence of interval acute intracranial abnormality. These results were communicated to Dr. Arora At 8:51 amon 1/7/2025by text page via the [facility] messaging system. Review of the MIP images confirms the above findings CTA NECK FINDINGS Aortic arch: Standard aortic branching. Atherosclerotic plaque within the visualized thoracic aorta and proximal major branch vessels of the neck. Streak/beam hardening artifact arising from a dense left-sided contrast bolus partially obscures the left subclavian artery. Within this limitation, there is no appreciable hemodynamically significant innominate or proximal subclavian artery stenosis. Right carotid system: CCA and ICA patent within the neck without stenosis. Minimal atherosclerotic plaque within the proximal ICA. Partially retropharyngeal course of the cervical ICA. Left carotid system: CCA and ICA patent within the neck without stenosis or significant atherosclerotic disease. Vertebral arteries: Codominant and patent within the neck without stenosis. Nonstenotic atherosclerotic plaque at the right vertebral artery origin.  Skeleton: Cervical spondylosis. No acute fracture or aggressive osseous lesion. Other neck: No neck mass or cervical lymphadenopathy. Subcentimeter thyroid  nodules not meeting consensus criteria for ultrasound follow-up based on size. No follow-up imaging recommended. Reference: J Am Coll Radiol. 2015 Feb;12(2): 143-50. Upper chest: No consolidation within the imaged lung apices. Review of the MIP images confirms the above findings CTA HEAD FINDINGS Anterior circulation: The intracranial internal carotid arteries are patent. Nonstenotic atherosclerotic plaque within both vessels The M1 middle cerebral arteries are patent. No M2 proximal branch occlusion or  high-grade proximal stenosis. The anterior cerebral arteries are patent. No intracranial aneurysm is identified. Posterior circulation: The intracranial vertebral arteries are patent. The basilar artery is patent. The posterior cerebral arteries are patent. Hypoplastic right P1 segment with sizable right posterior communicating artery. The left posterior communicating artery is diminutive or absent. Venous sinuses: Within the limitations of contrast timing, no convincing thrombus. Anatomic variants: As described. Review of the MIP images confirms the above findings No emergent large vessel occlusion identified. These results were communicated to Dr. Voncile at 9:04 amon 1/7/2025by text page via the [facility] messaging system. CT Brain Perfusion Findings: CBF (<30%) Volume: 0mL Perfusion (Tmax>6.0s) volume: 0mL Mismatch Volume: 0mL Infarction Location:None identified via perfusion data. IMPRESSION: Non-contrast head CT: 1. 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 2. No acute intracranial hemorrhage or interval acute demarcated cortical infarct. 3. Background advanced chronic small vessel ischemic disease. 4. Left sphenoid sinusitis. CTA neck: 1. The common carotid, internal carotid and vertebral arteries are patent within the neck without stenosis. Minimal atherosclerotic plaque within the proximal right ICA. Non-stenotic atherosclerotic plaque at the right vertebral artery origin. 2. Aortic Atherosclerosis (ICD10-I70.0). CTA head: 1. No emergent large vessel occlusion identified. 2. Non-stenotic atherosclerotic plaque within the intracranial internal carotid arteries. CT perfusion head: The perfusion software identifies no core infarct. The perfusion software identifies no critically hypoperfused parenchyma (utilizing the Tmax>6 seconds threshold). No mismatch volume reported. Electronically Signed   By: Rockey Childs D.O.   On: 09/15/2023 09:07    Pending  Labs Unresulted Labs (From admission, onward)     Start     Ordered   09/16/23 0500  CBC  Tomorrow morning,   R        09/15/23 1339   09/16/23 0500  Basic metabolic panel  Tomorrow morning,   R        09/15/23 1339   09/16/23 0500  Magnesium  Tomorrow morning,   R        09/15/23 1543   09/15/23 0905  Urinalysis, Routine w reflex microscopic -Urine, Clean Catch  Once,   URGENT       Question:  Specimen Source  Answer:  Urine, Clean Catch   09/15/23 0904            Vitals/Pain Today's Vitals   09/15/23 1345 09/15/23 1545 09/15/23 1613 09/15/23 1656  BP:  (!) 137/111 (!) 142/96   Pulse: 71 88 79 67  Resp: 17 (!) 21 17 16   Temp:   98.7 F (37.1 C)   TempSrc:      SpO2: 99% 99% 96% 92%  PainSc:        Isolation Precautions No active isolations  Medications Medications  acetaminophen  (TYLENOL ) tablet 650 mg (has no administration in time range)    Or  acetaminophen  (TYLENOL ) suppository 650 mg (has no administration in time range)  aspirin  EC tablet 81 mg (81 mg Oral Given 09/15/23 1351)  clopidogrel  (PLAVIX ) tablet 75 mg (75 mg Oral Given 09/15/23 1351)  gabapentin  (NEURONTIN ) capsule 300 mg (has no administration in time range)  metoprolol  succinate (TOPROL -XL) 24 hr tablet 25 mg (has no administration in time range)  rosuvastatin  (CRESTOR ) tablet 20 mg (20 mg Oral Given 09/15/23 1352)  tamsulosin  (FLOMAX ) capsule 0.8 mg (0.8 mg Oral Given 09/15/23 1351)  sodium chloride  flush (NS) 0.9 % injection 3 mL (3 mLs Intravenous Given 09/15/23 1132)  iohexol  (OMNIPAQUE ) 350 MG/ML injection 75 mL (75 mLs Intravenous Contrast Given 09/15/23 0841)  iohexol  (OMNIPAQUE ) 350 MG/ML injection 65 mL (65 mLs Intravenous Contrast Given 09/15/23 1037)    Mobility walks with person assist     Focused Assessments Neuro Assessment Handoff:  Swallow screen pass? Yes    NIH Stroke Scale  Dizziness Present: No Headache Present: No Interval: Initial Level of Consciousness (1a.)   : Alert,  keenly responsive LOC Questions (1b. )   : Answers both questions correctly LOC Commands (1c. )   : Performs both tasks correctly Best Gaze (2. )  : Normal Visual (3. )  : No visual loss Facial Palsy (4. )    : Normal symmetrical movements Motor Arm, Left (5a. )   : No drift Motor Arm, Right (5b. ) : No drift Motor Leg, Left (6a. )  : No drift Motor Leg, Right (6b. ) : No drift Limb Ataxia (7. ): Absent Sensory (8. )  : Normal, no sensory loss Best Language (9. )  : No aphasia Dysarthria (10. ): Normal Extinction/Inattention (11.)   : No Abnormality Complete NIHSS TOTAL: 0 Last date known well: 09/14/23 Last time known well: 2000 Neuro Assessment: Within Defined Limits Neuro Checks:   Initial (09/15/23 0830)  Has TPA been given? No If patient is a Neuro Trauma and patient is going to OR before floor call report to 4N Charge nurse: [phone] or [phone]   R Recommendations: See Admitting Provider Note  Report given to:   Additional Notes:

## 2023-09-15 NOTE — Plan of Care (Signed)
 FMTS Brief Progress Note  S: No complaints or questions tonight   O: BP 109/67 (BP Location: Left Arm)   Pulse 78   Temp 97.9 F (36.6 C) (Oral)   Resp 18   SpO2 98%   General: Lying in bed, watching television, NAD Cardio: RRR Respiratory: Breathing comfortably on room air Neuro: Cranial nerves II through XII intact, sensation intact.   A/P: Acute stroke in left parietal occipital area Neuro following, on DAPT and plan for TEE.  Considering anticoagulation given new stroke while on DAPT. Continue plan as in H&P   Joshua Domino, DO 09/15/2023, 9:31 PM PGY-3, [facility] Night Resident  Please page [phone] with questions.

## 2023-09-15 NOTE — Assessment & Plan Note (Addendum)
 See above for MR findings. Neurology to consult Cardiology for possible cardiac etiology workup. Patient Charles Frost at this time.  - Admit to FMTS, med tele with Dr. Jeanelle  - Telemetry - c/s neuro stroke team, appreciate recs - TEE to be discussed with cardiology per neuro note  - ASA 81, Plavix  75 daily  - Neuro checks q2 - c/s SLP, PT, OT - Permissive HTN?? - AM BMP, CBC, mag

## 2023-09-15 NOTE — Hospital Course (Addendum)
 Charles Frost is a 78 y.o.male with a history of hypertension, hyperlipidemia, RA, prediabetes, prostate cancer, GERD who was admitted to the family medicine teaching Service at [facility] for stroke. His hospital course is detailed below:  Stroke Presented with dizziness and presyncopal event.  MRI brain with acute cortical infarction in posterior left parietal lobe.  Concern for cardiac source, TEE with ASD with both right to left and left to right flow, likely etiology of his stroke vs cardiomyopathy with low EF.  Lower extremity Doppler obtained that was negative for DVT.  Symptoms resolved and patient was at baseline prior to discharge, will be discharged on Eliquis  and DAPT was discontinued as he failed this regimen (current stroke occurred while on DAPT).  HFrEF Echo 09/07/2023 with EF 25 to 30%.  Continued his home medications.  Other chronic conditions were medically managed with home medications and formulary alternatives as necessary   PCP Follow-up Recommendations: CTAP with 2 slowly growing subcapsular hypoattenuating structures in right hepatic lobe - f/u with contrast MRI Will need cardiac ischemic evaluation - f/u with cardiology as scheduled

## 2023-09-15 NOTE — ED Provider Notes (Signed)
 [facility] AT [facility] Provider Note  CSN: [id number] Arrival date & time: 09/15/23 9176  Chief Complaint(s) Code Stroke  HPI Charles Frost is a 78 y.o. male with PMH recent CVA, prostate cancer, HTN, HLD, GERD CHF who presents emergency department for evaluation of slurred speech and weakness.  Patient states  that he went to bed at 8 PM last night, awoke this morning with abdominal pain.  He walked to the bathroom and had an episode of diarrhea.  He then subsequently walked to the kitchen where he had what was presumed to be a syncopal episode as his wife found him on the ground pale and sweaty.  He had slurred speech at that time and they called 911 and patient arrives as a stroke alert due to slurred speech and generalized weakness.  He currently states that his abdominal pain has resolved and his speech appears to be improving here in the emergency department.  Denies chest pain, shortness of breath, headache, fever or other systemic symptoms.   Past Medical History Past Medical History:  Diagnosis Date   GERD (gastroesophageal reflux disease)    Hypercholesterolemia    Hypertension    Prostate cancer (HCC)    Rheumatoid arthritis (HCC)    RA   Patient Active Problem List   Diagnosis Date Noted   Stroke (HCC) 09/15/2023   HFrEF (heart failure with reduced ejection fraction) (HCC) 09/15/2023   Acute ischemic left PCA stroke (HCC) 09/07/2023   UTI (urinary tract infection) 12/08/2016   Bladder pain 12/07/2016   Anemia 12/07/2016   Hyperglycemia 12/07/2016   Hypertension 12/07/2016   Malignant neoplasm of prostate (HCC) 08/28/2016   Home Medication(s) Prior to Admission medications   Medication Sig Start Date End Date Taking? Authorizing Provider  acetaminophen  (TYLENOL ) 500 MG tablet Take 1,000 mg by mouth 2 (two) times daily as needed for moderate pain (pain score 4-6), fever or headache.   Yes [provider]  adalimumab (HUMIRA, 2  PEN,) 40 MG/0.4ML pen Inject 40 mg into the skin See admin instructions. Inject 40mg  subcutaneously every other Monday.   Yes [provider]  aspirin  EC 81 MG tablet Take 1 tablet (81 mg total) by mouth daily. Swallow whole. 09/10/23  Yes Toberman, Stevi W, NP  clopidogrel  (PLAVIX ) 75 MG tablet Take 1 tablet (75 mg total) by mouth daily. 09/10/23  Yes Toberman, Stevi W, NP  diclofenac Sodium (VOLTAREN) 1 % GEL Apply 2 g topically 2 (two) times daily as needed (pain).   Yes [provider]  gabapentin  (NEURONTIN ) 300 MG capsule Take 300 mg by mouth at bedtime.   Yes [provider]  metoprolol  succinate (TOPROL -XL) 25 MG 24 hr tablet Take 1 tablet (25 mg total) by mouth daily. 09/09/23  Yes Toberman, Stevi W, NP  rosuvastatin  (CRESTOR ) 20 MG tablet Take 1 tablet (20 mg total) by mouth daily. 09/10/23  Yes Toberman, Stevi W, NP  sacubitril -valsartan  (ENTRESTO ) 24-26 MG Take 1 tablet by mouth 2 (two) times daily. Patient taking differently: Take 1 tablet by mouth daily. 09/09/23  Yes Toberman, Stevi W, NP  tamsulosin  (FLOMAX ) 0.4 MG CAPS capsule Take 2 capsules (0.8 mg total) by mouth daily after supper. Patient taking differently: Take 0.8 mg by mouth daily. 12/09/16  Yes Samtani, Jai-Gurmukh, MD  Past Surgical History Past Surgical History:  Procedure Laterality Date   cysto uretero remove stone  2009   cystoscopy insert stent  2009   PROSTATE BIOPSY     ulnar nerve release Right 2008   Family History Family History  Problem Relation Age of Onset   Cancer Maternal Aunt    Cancer Paternal Grandfather    Dementia Mother     Social History Social History   Tobacco Use   Smoking status: Former    Current packs/day: 0.00    Average packs/day: 0.3 packs/day for 30.0 years (7.5 ttl pk-yrs)    Types: Cigarettes    Start date: 05/09/1970    Quit  date: 05/09/2000    Years since quitting: 23.3   Smokeless tobacco: Never  Substance Use Topics   Alcohol  use: No   Drug use: No   Allergies Patient has no known allergies.  Review of Systems Review of Systems  Neurological:  Positive for syncope and speech difficulty.    Physical Exam Vital Signs  I have reviewed the triage vital signs BP (!) 142/96   Pulse 67   Temp 98.7 F (37.1 C)   Resp 16   SpO2 92%   Physical Exam Constitutional:      General: He is not in acute distress.    Appearance: Normal appearance.  HENT:     Head: Normocephalic and atraumatic.     Nose: No congestion or rhinorrhea.  Eyes:     General:        Right eye: No discharge.        Left eye: No discharge.     Extraocular Movements: Extraocular movements intact.     Pupils: Pupils are equal, round, and reactive to light.  Cardiovascular:     Rate and Rhythm: Normal rate and regular rhythm.     Heart sounds: No murmur heard. Pulmonary:     Effort: No respiratory distress.     Breath sounds: No wheezing or rales.  Abdominal:     General: There is no distension.     Tenderness: There is no abdominal tenderness.  Musculoskeletal:        General: Normal range of motion.     Cervical back: Normal range of motion.  Skin:    General: Skin is warm and dry.  Neurological:     General: No focal deficit present.     Mental Status: He is alert.     Cranial Nerves: No cranial nerve deficit.     Sensory: No sensory deficit.     Motor: No weakness.     ED Results and Treatments Labs (all labs ordered are listed, but only abnormal results are displayed) Labs Reviewed  COMPREHENSIVE METABOLIC PANEL - Abnormal; Notable for the following components:      Result Value   Glucose, Bld 124 (*)    Creatinine, Ser 1.34 (*)    GFR, Estimated 55 (*)    All other components within normal limits  I-STAT CHEM 8, ED - Abnormal; Notable for the following components:   BUN 27 (*)    Creatinine, Ser 1.30 (*)     Glucose, Bld 117 (*)    Calcium , Ion 0.97 (*)    All other components within normal limits  CBG MONITORING, ED - Abnormal; Notable for the following components:   Glucose-Capillary 122 (*)    All other components within normal limits  PROTIME-INR  APTT  CBC  DIFFERENTIAL  ETHANOL  URINALYSIS, ROUTINE W REFLEX  MICROSCOPIC  CBC  BASIC METABOLIC PANEL  MAGNESIUM  TROPONIN I (HIGH SENSITIVITY)  TROPONIN I (HIGH SENSITIVITY)                                                                                                                          Radiology DG Chest 1 View Result Date: 09/15/2023 CLINICAL DATA:  Dizziness. EXAM: CHEST  1 VIEW COMPARISON:  None Available. FINDINGS: No focal consolidation, pleural effusion, or pneumothorax. The cardiac silhouette is within normal limits. No acute osseous pathology. IMPRESSION: No active disease. Electronically Signed   By: Vanetta Chou M.D.   On: 09/15/2023 14:38   MR BRAIN WO CONTRAST Result Date: 09/15/2023 CLINICAL DATA:  Neuro deficit, acute, stroke suspected. EXAM: MRI HEAD WITHOUT CONTRAST TECHNIQUE: Multiplanar, multiecho pulse sequences of the brain and surrounding structures were obtained without intravenous contrast. COMPARISON:  CT studies earlier same day.  MRI 09/07/2023. FINDINGS: Brain: Newly seen cluster of small acute infarctions affecting the cortical brain in the left occipital region. Previously seen acute infarction just anterior to that does not show any progression. Newly seen foci of acute cortical infarction in the posterior left parietal lobe. Findings consistent with recurrent embolic infarctions. Chronic small-vessel ischemic changes remain evident within the pons. Numerous old small vessel cerebellar infarctions. Old infarction of the right occipital cortical and subcortical brain. Advanced chronic small-vessel ischemic changes throughout the cerebral hemispheric white matter. No evidence of acute hemorrhage,  hydrocephalus or extra-axial collection. Chronic hemosiderin deposition associated with many of the old infarctions. Vascular: Major vessels at the base of the brain show flow. Skull and upper cervical spine: Negative Sinuses/Orbits: Clear/normal Other: None IMPRESSION: 1. Newly seen cluster of small acute infarctions affecting the cortical brain in the left occipital region. Previously seen acute infarction just anterior to that does not show any progression. Newly seen foci of acute cortical infarction in the posterior left parietal lobe. Findings consistent with recurrent embolic infarctions. 2. Advanced chronic small-vessel ischemic changes elsewhere in the brain as outlined above. Electronically Signed   By: Oneil Officer M.D.   On: 09/15/2023 12:09   CT ABDOMEN PELVIS W CONTRAST Result Date: 09/15/2023 CLINICAL DATA:  LLQ abdominal pain. EXAM: CT ABDOMEN AND PELVIS WITH CONTRAST TECHNIQUE: Multidetector CT imaging of the abdomen and pelvis was performed using the standard protocol following bolus administration of intravenous contrast. RADIATION DOSE REDUCTION: This exam was performed according to the departmental dose-optimization program which includes automated exposure control, adjustment of the mA and/or kV according to patient size and/or use of iterative reconstruction technique. CONTRAST:  65mL OMNIPAQUE  IOHEXOL  350 MG/ML SOLN COMPARISON:  CT scan renal stone protocol 12/04/2016. FINDINGS: Examination is suboptimal due to patient's motion during data acquisition. Lower chest: The lung bases are clear. No pleural effusion. The heart is normal in size. No pericardial effusion. Hepatobiliary: The liver is normal in size. Non-cirrhotic configuration. No suspicious mass. Redemonstration of 2 subcapsular hypoattenuating structures in the right hepatic lobe, segments 5/6 which  are not well evaluated due to extensive motion at these levels. However, these lesions were smaller but present on the prior  examination from 2018. Consider further evaluation with nonemergent contrast-enhanced MRI abdomen as per liver mass protocol. No intrahepatic or extrahepatic bile duct dilation. No calcified gallstones. Normal gallbladder wall thickness. No pericholecystic inflammatory changes. Pancreas: Small focus of sub 5 mm dystrophic calcification in the uncinate process is nonspecific but can be seen as a sequela of previous episodes of chronic pancreatitis. Otherwise no suspicious pancreatic lesion. No pancreatic ductal dilatation or surrounding inflammatory changes. Spleen: Within normal limits. No focal lesion. Adrenals/Urinary Tract: Limited evaluation due to extensive motion at this level. Multiple areas of scarring noted in the left kidney. There is excreted contrast in bilateral renal collecting systems, which limits evaluation for nonobstructing calculi. No hydroureteronephrosis on either side. Urinary bladder is under distended, precluding optimal assessment. However, no large mass or stones identified. No perivesical fat stranding. Stomach/Bowel: No disproportionate dilation of the small or large bowel loops. No evidence of abnormal bowel wall thickening or inflammatory changes. The appendix is unremarkable. There are multiple diverticula throughout the colon, without imaging signs of diverticulitis. Vascular/Lymphatic: No ascites or pneumoperitoneum. No abdominal or pelvic lymphadenopathy, by size criteria. No aneurysmal dilation of the major abdominal arteries. There are moderate peripheral atherosclerotic vascular calcifications of the aorta and its major branches. Reproductive: Enlarged prostate. Symmetric seminal vesicles. Other: There are small fat containing umbilical and left inguinal hernias. The soft tissues and abdominal wall are otherwise unremarkable. Musculoskeletal: No suspicious osseous lesions. There are mild multilevel degenerative changes in the visualized spine. IMPRESSION: 1. Examination is  degraded by motion. 2. No acute inflammatory process identified within the abdomen or pelvis. 3. There are 2, slowly growing subcapsular hypoattenuating structures in the right hepatic lobe, segments 5/6, not well evaluated on this exam. Consider nonemergent contrast-enhanced MRI abdomen as per liver mass protocol for better characterization. 4. Multiple other nonacute observations, as described above. Electronically Signed   By: Ree Molt M.D.   On: 09/15/2023 10:53   CT HEAD CODE STROKE WO CONTRAST Result Date: 09/15/2023 CLINICAL DATA:  Provided history: Neuro deficit, acute, stroke suspected. EXAM: CT ANGIOGRAPHY HEAD AND NECK CT PERFUSION BRAIN TECHNIQUE: Multidetector CT imaging of the head and neck was performed using the standard protocol during bolus administration of intravenous contrast. Multiplanar CT image reconstructions and MIPs were obtained to evaluate the vascular anatomy. Carotid stenosis measurements (when applicable) are obtained utilizing NASCET criteria, using the distal internal carotid diameter as the denominator. Multiphase CT imaging of the brain was performed following IV bolus contrast injection. Subsequent parametric perfusion maps were calculated using RAPID software. RADIATION DOSE REDUCTION: This exam was performed according to the departmental dose-optimization program which includes automated exposure control, adjustment of the mA and/or kV according to patient size and/or use of iterative reconstruction technique. CONTRAST:  Administered contrast not known at this time. COMPARISON:  Noncontrast head CT 09/08/2023. CT angiogram head/neck 09/07/2023. Brain MRI 09/07/2023. FINDINGS: CT HEAD FINDINGS Brain: No age advanced or lobar predominant parenchymal atrophy. Known small acute/subacute cortical/subcortical infarcts within the left parietal and left occipital lobes. Chronic cortical/subcortical infarct within the right occipital lobe. Chronic lacunar infarct within the left  parietal white matter. Background advanced patchy and ill-defined hypoattenuation within the cerebral white matter, nonspecific but compatible chronic small vessel disease. Small chronic infarcts within the bilateral cerebellar hemispheres. No interval acute demarcated cortical infarct is identified. No acute intracranial hemorrhage. No evidence of an intracranial  mass. No chronic intracranial blood products. No extra-axial fluid collection. No midline shift. Vascular: No hyperdense vessel. Atherosclerotic calcifications. Skull: No calvarial fracture or aggressive osseous lesion. Sinuses/Orbits: No orbital mass or acute orbital finding. Small volume frothy secretions within the left sphenoid sinus. ASPECTS (Alberta Stroke Program Early CT Score) - Ganglionic level infarction (caudate, lentiform nuclei, internal capsule, insula, M1-M3 cortex): 7 - Supraganglionic infarction (M4-M6 cortex): 3 Total score (0-10 with 10 being normal): 10 (when discounting the previously demonstrated, known acute/subacute cortically-based infarcts described above). No evidence of interval acute intracranial abnormality. These results were communicated to Dr. Arora At 8:51 amon 1/7/2025by text page via the [facility] messaging system. Review of the MIP images confirms the above findings CTA NECK FINDINGS Aortic arch: Standard aortic branching. Atherosclerotic plaque within the visualized thoracic aorta and proximal major branch vessels of the neck. Streak/beam hardening artifact arising from a dense left-sided contrast bolus partially obscures the left subclavian artery. Within this limitation, there is no appreciable hemodynamically significant innominate or proximal subclavian artery stenosis. Right carotid system: CCA and ICA patent within the neck without stenosis. Minimal atherosclerotic plaque within the proximal ICA. Partially retropharyngeal course of the cervical ICA. Left carotid system: CCA and ICA patent within the neck without  stenosis or significant atherosclerotic disease. Vertebral arteries: Codominant and patent within the neck without stenosis. Nonstenotic atherosclerotic plaque at the right vertebral artery origin. Skeleton: Cervical spondylosis. No acute fracture or aggressive osseous lesion. Other neck: No neck mass or cervical lymphadenopathy. Subcentimeter thyroid  nodules not meeting consensus criteria for ultrasound follow-up based on size. No follow-up imaging recommended. Reference: J Am Coll Radiol. 2015 Feb;12(2): 143-50. Upper chest: No consolidation within the imaged lung apices. Review of the MIP images confirms the above findings CTA HEAD FINDINGS Anterior circulation: The intracranial internal carotid arteries are patent. Nonstenotic atherosclerotic plaque within both vessels The M1 middle cerebral arteries are patent. No M2 proximal branch occlusion or high-grade proximal stenosis. The anterior cerebral arteries are patent. No intracranial aneurysm is identified. Posterior circulation: The intracranial vertebral arteries are patent. The basilar artery is patent. The posterior cerebral arteries are patent. Hypoplastic right P1 segment with sizable right posterior communicating artery. The left posterior communicating artery is diminutive or absent. Venous sinuses: Within the limitations of contrast timing, no convincing thrombus. Anatomic variants: As described. Review of the MIP images confirms the above findings No emergent large vessel occlusion identified. These results were communicated to Dr. Voncile at 9:04 amon 1/7/2025by text page via the [facility] messaging system. CT Brain Perfusion Findings: CBF (<30%) Volume: 0mL Perfusion (Tmax>6.0s) volume: 0mL Mismatch Volume: 0mL Infarction Location:None identified via perfusion data. IMPRESSION: Non-contrast head CT: 1. 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 2. No acute intracranial hemorrhage or interval acute  demarcated cortical infarct. 3. Background advanced chronic small vessel ischemic disease. 4. Left sphenoid sinusitis. CTA neck: 1. The common carotid, internal carotid and vertebral arteries are patent within the neck without stenosis. Minimal atherosclerotic plaque within the proximal right ICA. Non-stenotic atherosclerotic plaque at the right vertebral artery origin. 2. Aortic Atherosclerosis (ICD10-I70.0). CTA head: 1. No emergent large vessel occlusion identified. 2. Non-stenotic atherosclerotic plaque within the intracranial internal carotid arteries. CT perfusion head: The perfusion software identifies no core infarct. The perfusion software identifies no critically hypoperfused parenchyma (utilizing the Tmax>6 seconds threshold). No mismatch volume reported. Electronically Signed   By: Rockey Childs D.O.   On: 09/15/2023 09:07   CT ANGIO HEAD NECK W WO  CM W PERF (CODE STROKE) Result Date: 09/15/2023 CLINICAL DATA:  Provided history: Neuro deficit, acute, stroke suspected. EXAM: CT ANGIOGRAPHY HEAD AND NECK CT PERFUSION BRAIN TECHNIQUE: Multidetector CT imaging of the head and neck was performed using the standard protocol during bolus administration of intravenous contrast. Multiplanar CT image reconstructions and MIPs were obtained to evaluate the vascular anatomy. Carotid stenosis measurements (when applicable) are obtained utilizing NASCET criteria, using the distal internal carotid diameter as the denominator. Multiphase CT imaging of the brain was performed following IV bolus contrast injection. Subsequent parametric perfusion maps were calculated using RAPID software. RADIATION DOSE REDUCTION: This exam was performed according to the departmental dose-optimization program which includes automated exposure control, adjustment of the mA and/or kV according to patient size and/or use of iterative reconstruction technique. CONTRAST:  Administered contrast not known at this time. COMPARISON:  Noncontrast  head CT 09/08/2023. CT angiogram head/neck 09/07/2023. Brain MRI 09/07/2023. FINDINGS: CT HEAD FINDINGS Brain: No age advanced or lobar predominant parenchymal atrophy. Known small acute/subacute cortical/subcortical infarcts within the left parietal and left occipital lobes. Chronic cortical/subcortical infarct within the right occipital lobe. Chronic lacunar infarct within the left parietal white matter. Background advanced patchy and ill-defined hypoattenuation within the cerebral white matter, nonspecific but compatible chronic small vessel disease. Small chronic infarcts within the bilateral cerebellar hemispheres. No interval acute demarcated cortical infarct is identified. No acute intracranial hemorrhage. No evidence of an intracranial mass. No chronic intracranial blood products. No extra-axial fluid collection. No midline shift. Vascular: No hyperdense vessel. Atherosclerotic calcifications. Skull: No calvarial fracture or aggressive osseous lesion. Sinuses/Orbits: No orbital mass or acute orbital finding. Small volume frothy secretions within the left sphenoid sinus. ASPECTS (Alberta Stroke Program Early CT Score) - Ganglionic level infarction (caudate, lentiform nuclei, internal capsule, insula, M1-M3 cortex): 7 - Supraganglionic infarction (M4-M6 cortex): 3 Total score (0-10 with 10 being normal): 10 (when discounting the previously demonstrated, known acute/subacute cortically-based infarcts described above). No evidence of interval acute intracranial abnormality. These results were communicated to Dr. Arora At 8:51 amon 1/7/2025by text page via the [facility] messaging system. Review of the MIP images confirms the above findings CTA NECK FINDINGS Aortic arch: Standard aortic branching. Atherosclerotic plaque within the visualized thoracic aorta and proximal major branch vessels of the neck. Streak/beam hardening artifact arising from a dense left-sided contrast bolus partially obscures the left subclavian  artery. Within this limitation, there is no appreciable hemodynamically significant innominate or proximal subclavian artery stenosis. Right carotid system: CCA and ICA patent within the neck without stenosis. Minimal atherosclerotic plaque within the proximal ICA. Partially retropharyngeal course of the cervical ICA. Left carotid system: CCA and ICA patent within the neck without stenosis or significant atherosclerotic disease. Vertebral arteries: Codominant and patent within the neck without stenosis. Nonstenotic atherosclerotic plaque at the right vertebral artery origin. Skeleton: Cervical spondylosis. No acute fracture or aggressive osseous lesion. Other neck: No neck mass or cervical lymphadenopathy. Subcentimeter thyroid  nodules not meeting consensus criteria for ultrasound follow-up based on size. No follow-up imaging recommended. Reference: J Am Coll Radiol. 2015 Feb;12(2): 143-50. Upper chest: No consolidation within the imaged lung apices. Review of the MIP images confirms the above findings CTA HEAD FINDINGS Anterior circulation: The intracranial internal carotid arteries are patent. Nonstenotic atherosclerotic plaque within both vessels The M1 middle cerebral arteries are patent. No M2 proximal branch occlusion or high-grade proximal stenosis. The anterior cerebral arteries are patent. No intracranial aneurysm is identified. Posterior circulation: The intracranial vertebral arteries are patent. The basilar  artery is patent. The posterior cerebral arteries are patent. Hypoplastic right P1 segment with sizable right posterior communicating artery. The left posterior communicating artery is diminutive or absent. Venous sinuses: Within the limitations of contrast timing, no convincing thrombus. Anatomic variants: As described. Review of the MIP images confirms the above findings No emergent large vessel occlusion identified. These results were communicated to Dr. Voncile at 9:04 amon 1/7/2025by text page via  the [facility] messaging system. CT Brain Perfusion Findings: CBF (<30%) Volume: 0mL Perfusion (Tmax>6.0s) volume: 0mL Mismatch Volume: 0mL Infarction Location:None identified via perfusion data. IMPRESSION: Non-contrast head CT: 1. 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 2. No acute intracranial hemorrhage or interval acute demarcated cortical infarct. 3. Background advanced chronic small vessel ischemic disease. 4. Left sphenoid sinusitis. CTA neck: 1. The common carotid, internal carotid and vertebral arteries are patent within the neck without stenosis. Minimal atherosclerotic plaque within the proximal right ICA. Non-stenotic atherosclerotic plaque at the right vertebral artery origin. 2. Aortic Atherosclerosis (ICD10-I70.0). CTA head: 1. No emergent large vessel occlusion identified. 2. Non-stenotic atherosclerotic plaque within the intracranial internal carotid arteries. CT perfusion head: The perfusion software identifies no core infarct. The perfusion software identifies no critically hypoperfused parenchyma (utilizing the Tmax>6 seconds threshold). No mismatch volume reported. Electronically Signed   By: Rockey Childs D.O.   On: 09/15/2023 09:07    Pertinent labs & imaging results that were available during my care of the patient were reviewed by me and considered in my medical decision making (see MDM for details).  Medications Ordered in ED Medications  acetaminophen  (TYLENOL ) tablet 650 mg (has no administration in time range)    Or  acetaminophen  (TYLENOL ) suppository 650 mg (has no administration in time range)  aspirin  EC tablet 81 mg (81 mg Oral Given 09/15/23 1351)  clopidogrel  (PLAVIX ) tablet 75 mg (75 mg Oral Given 09/15/23 1351)  gabapentin  (NEURONTIN ) capsule 300 mg (has no administration in time range)  metoprolol  succinate (TOPROL -XL) 24 hr tablet 25 mg (has no administration in time range)  rosuvastatin  (CRESTOR ) tablet 20 mg  (20 mg Oral Given 09/15/23 1352)  tamsulosin  (FLOMAX ) capsule 0.8 mg (0.8 mg Oral Given 09/15/23 1351)  sodium chloride  flush (NS) 0.9 % injection 3 mL (3 mLs Intravenous Given 09/15/23 1132)  iohexol  (OMNIPAQUE ) 350 MG/ML injection 75 mL (75 mLs Intravenous Contrast Given 09/15/23 0841)  iohexol  (OMNIPAQUE ) 350 MG/ML injection 65 mL (65 mLs Intravenous Contrast Given 09/15/23 1037)                                                                                                                                     Procedures .Critical Care  Performed by: Albertina Dixon, MD Authorized by: Albertina Dixon, MD   Critical care provider statement:    Critical care time (minutes):  30   Critical care was necessary to treat or prevent imminent or life-threatening deterioration  of the following conditions:  CNS failure or compromise   Critical care was time spent personally by me on the following activities:  Development of treatment plan with patient or surrogate, discussions with consultants, evaluation of patient's response to treatment, examination of patient, ordering and review of laboratory studies, ordering and review of radiographic studies, ordering and performing treatments and interventions, pulse oximetry, re-evaluation of patient's condition and review of old charts   (including critical care time)  Medical Decision Making / ED Course   This patient presents to the ED for concern of syncope, weakness, slurred speech, this involves an extensive number of treatment options, and is a complaint that carries with it a high risk of complications and morbidity.  The differential diagnosis includes CVA, hemorrhagic stroke, mass, Todd's paralysis, seizure, electrolyte abnormality, encephalopathy, complicated migraine, orthostatic syncope, cardiogenic syncope, vasovagal syncope, electrolyte abnormality, dehydration, dysrhythmia, vasovagal, Hypoglycemia, Seizure, Autonomic Insufficiency  MDM: Patient seen  emerged department for evaluation of a syncopal episode, slurred speech and concern for possible CVA.  Arrives as a stroke alert and was taken straight to CT where I consulted with the stroke neurologist on-call Dr.Arora.  CT head and CTA unremarkable.  TNK considered but patient's deficits are not consistent with LVO and thus TNK not administered.  Laboratory evaluation with creatinine 1.34, but is otherwise unremarkable.  CT abdomen pelvis obtained in the setting of severe abdominal pain causing his syncopal episode which was negative for acute pathology but does show small lesions in the liver that will need to be followed up outpatient.  Confirmatory MRI obtained that is positive for a small cluster of left occipital strokes that appear to be new. Dr. Voncile requesting medical admission for further workup.  Patient admitted.   Additional history obtained: -Additional history obtained from multiple family members -External records from outside source obtained and reviewed including: Chart review including previous notes, labs, imaging, consultation notes   Lab Tests: -I ordered, reviewed, and interpreted labs.   The pertinent results include:   Labs Reviewed  COMPREHENSIVE METABOLIC PANEL - Abnormal; Notable for the following components:      Result Value   Glucose, Bld 124 (*)    Creatinine, Ser 1.34 (*)    GFR, Estimated 55 (*)    All other components within normal limits  I-STAT CHEM 8, ED - Abnormal; Notable for the following components:   BUN 27 (*)    Creatinine, Ser 1.30 (*)    Glucose, Bld 117 (*)    Calcium , Ion 0.97 (*)    All other components within normal limits  CBG MONITORING, ED - Abnormal; Notable for the following components:   Glucose-Capillary 122 (*)    All other components within normal limits  PROTIME-INR  APTT  CBC  DIFFERENTIAL  ETHANOL  URINALYSIS, ROUTINE W REFLEX MICROSCOPIC  CBC  BASIC METABOLIC PANEL  MAGNESIUM  TROPONIN I (HIGH SENSITIVITY)   TROPONIN I (HIGH SENSITIVITY)      EKG   EKG Interpretation Date/Time:  Tuesday September 15 2023 08:46:45 EST Ventricular Rate:  67 PR Interval:  141 QRS Duration:  105 QT Interval:  385 QTC Calculation: 407 R Axis:   -58  Text Interpretation: Sinus rhythm Left anterior fascicular block Confirmed by Freddy Kinne ([id number]) on 09/15/2023 5:19:06 PM         Imaging Studies ordered: I ordered imaging studies including CT head, CT angio brain, CT abdomen pelvis, MRI brain I independently visualized and interpreted imaging. I agree with the radiologist interpretation  Medicines ordered and prescription drug management: Meds ordered this encounter  Medications   sodium chloride  flush (NS) 0.9 % injection 3 mL   iohexol  (OMNIPAQUE ) 350 MG/ML injection 75 mL   iohexol  (OMNIPAQUE ) 350 MG/ML injection 65 mL   OR Linked Order Group    acetaminophen  (TYLENOL ) tablet 650 mg    acetaminophen  (TYLENOL ) suppository 650 mg   aspirin  EC tablet 81 mg   clopidogrel  (PLAVIX ) tablet 75 mg   gabapentin  (NEURONTIN ) capsule 300 mg   metoprolol  succinate (TOPROL -XL) 24 hr tablet 25 mg   rosuvastatin  (CRESTOR ) tablet 20 mg   DISCONTD: sacubitril -valsartan  (ENTRESTO ) 24-26 mg per tablet    ACE-inhibitors have NOT been administered in the past 36-hours.:   YES (confirmed by ordering provider)   tamsulosin  (FLOMAX ) capsule 0.8 mg    -I have reviewed the patients home medicines and have made adjustments as needed  Critical interventions Stroke activation and consideration of TNK  Consultations Obtained: I requested consultation with the stroke neurologist,  and discussed lab and imaging findings as well as pertinent plan - they recommend: Medical admission   Cardiac Monitoring: The patient was maintained on a cardiac monitor.  I personally viewed and interpreted the cardiac monitored which showed an underlying rhythm of: NSR  Social Determinants of Health:  Factors impacting patients care  include: none   Reevaluation: After the interventions noted above, I reevaluated the patient and found that they have :stayed the same  Co morbidities that complicate the patient evaluation  Past Medical History:  Diagnosis Date   GERD (gastroesophageal reflux disease)    Hypercholesterolemia    Hypertension    Prostate cancer (HCC)    Rheumatoid arthritis (HCC)    RA      Dispostion: I considered admission for this patient, and patient require hospital admission for new stroke     Final Clinical Impression(s) / ED Diagnoses Final diagnoses:  Cerebrovascular accident (CVA), unspecified mechanism (HCC)     @PCDICTATION @    Albertina Dixon, MD 09/15/23 1719

## 2023-09-15 NOTE — Consult Note (Signed)
 NEUROLOGY CONSULT NOTE   Date of service: September 15, 2023 Patient Name: Charles Frost MRN:  [id number] DOB:  [birth year] Chief Complaint: Slurred speech, nausea Requesting Provider: Albertina Dixon, MD  History of Present Illness  Charles Frost is a 78 y.o. male with a past medical history significant for essential hypertension, RA, prostate cancer s/p TURP 2017, ADT and radiation therapy, HLD, borderline diabetes, remote right occipital lobe infarct, and recent admission 09/07/23 with complaints of right-sided weakness, slurred speech, right inferior quadrantanopia, and aphasia s/p TNKase  administration with findings of a small acute left occipital infarct.  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 Patient was discharged on 09/09/23 on DAPT and initiation of GDMT per cardiology with subtle residual dysarthria.  This morning 09/15/23, patient woke up from sleeping with significant nausea and when getting out of bed to chair at bedside, he was leaning rightward with complaints of dizziness and patient's wife noted significantly worsening slurred speech from baseline deficit and activated EMS.  EMS noted that the patient's wife was holding the patient steady from behind throughout the evaluation without unilateral weakness. Initially, patient's speech was slurred but with improved significantly prior to ED arrival.     LKW: Last night when he went to bed at 20:00 Modified rankin score: 1-No significant post stroke disability and can perform usual duties with stroke symptoms IV Thrombolysis: No, patient with stroke and TNKase  administration 09/07/23.  Patient presents with symptoms consistent with previous stroke and improvement to baseline on neurology evaluation EVT: No, no ELVO NIHSS on arrival: 2  NIHSS components Score: Comment  1a Level of Conscious 0[x]  1[]  2[]  3[]      1b  LOC Questions 0[x]  1[]  2[]       1c LOC Commands 0[x]  1[]  2[]       2 Best Gaze 0[x]  1[]  2[]       3 Visual 0[x]  1[]  2[]  3[]      4 Facial Palsy 0[]  1[x]  2[]  3[]    Subtle right mouth asymmetry  5a Motor Arm - left 0[x]  1[]  2[]  3[]  4[]  UN[]    5b Motor Arm - Right 0[x]  1[]  2[]  3[]  4[]  UN[]    6a Motor Leg - Left 0[x]  1[]  2[]  3[]  4[]  UN[]    6b Motor Leg - Right 0[x]  1[]  2[]  3[]  4[]  UN[]    7 Limb Ataxia 0[x]  1[]  2[]  3[]  UN[]     8 Sensory 0[x]  1[]  2[]  UN[]      9 Best Language 0[x]  1[]  2[]  3[]      10 Dysarthria 0[]  1[x]  2[]  UN[]      11 Extinct. and Inattention 0[x]  1[]  2[]       TOTAL: 2     ROS  Comprehensive ROS performed and pertinent positives documented in HPI   Past History   Past Medical History:  Diagnosis Date   GERD (gastroesophageal reflux disease)    Hypercholesterolemia    Hypertension    Prostate cancer (HCC)    Rheumatoid arthritis (HCC)    RA   Past Surgical History:  Procedure Laterality Date   cysto uretero remove stone  2009   cystoscopy insert stent  2009   PROSTATE BIOPSY     ulnar nerve release Right 2008   Family History: Family History  Problem Relation Age of Onset   Cancer Maternal Aunt    Cancer Paternal Grandfather    Dementia Mother    Social History  reports  that he quit smoking about 23 years ago. His smoking use included cigarettes. He started smoking about 53 years ago. He has a 7.5 pack-year smoking history. He has never used smokeless tobacco. He reports that he does not drink alcohol  and does not use drugs.  No Known Allergies  Medications   Current Facility-Administered Medications:    sodium chloride  flush (NS) 0.9 % injection 3 mL, 3 mL, Intravenous, Once, Kommor, [place], MD  Current Outpatient Medications:    adalimumab (HUMIRA, 2 PEN,) 40 MG/0.4ML pen, Inject 40 mg into the skin every 14 (fourteen) days., Disp: , Rfl:    aspirin  EC 81 MG tablet, Take 1 tablet (81 mg total) by mouth daily. Swallow whole., Disp: 30 tablet, Rfl: 12    citalopram (CELEXA) 20 MG tablet, Take 20 mg by mouth daily as needed (mood)., Disp: , Rfl:    clopidogrel  (PLAVIX ) 75 MG tablet, Take 1 tablet (75 mg total) by mouth daily., Disp: 21 tablet, Rfl: 0   diclofenac Sodium (VOLTAREN) 1 % GEL, Apply 2 g topically 2 (two) times daily as needed (pain)., Disp: , Rfl:    FLUoxetine (PROZAC) 20 MG capsule, Take 20 mg by mouth daily., Disp: , Rfl:    gabapentin  (NEURONTIN ) 300 MG capsule, Take 300 mg by mouth at bedtime., Disp: , Rfl:    hydroxychloroquine (PLAQUENIL) 200 MG tablet, Take 400 mg by mouth daily., Disp: , Rfl:    leflunomide (ARAVA) 20 MG tablet, Take 20 mg by mouth daily., Disp: , Rfl:    metoprolol  succinate (TOPROL -XL) 25 MG 24 hr tablet, Take 1 tablet (25 mg total) by mouth daily., Disp: 30 tablet, Rfl: 0   omeprazole (PRILOSEC) 20 MG capsule, Take 20 mg by mouth daily., Disp: , Rfl:    rosuvastatin  (CRESTOR ) 20 MG tablet, Take 1 tablet (20 mg total) by mouth daily., Disp: 30 tablet, Rfl: 1   sacubitril -valsartan  (ENTRESTO ) 24-26 MG, Take 1 tablet by mouth 2 (two) times daily., Disp: 60 tablet, Rfl: 0   tamsulosin  (FLOMAX ) 0.4 MG CAPS capsule, Take 2 capsules (0.8 mg total) by mouth daily after supper., Disp: 30 capsule, Rfl: 2   traZODone (DESYREL) 150 MG tablet, Take 150 mg by mouth at bedtime as needed for sleep., Disp: , Rfl:   Vitals   Vitals:   09/15/23 0847  BP: 125/80  Pulse: 69  Temp: 97.6 F (36.4 C)  TempSrc: Oral  SpO2: 99%    There is no height or weight on file to calculate BMI.  Physical Exam   Constitutional: Appears well-developed and well-nourished, in no acute distress Psych: Affect appropriate to situation. Calm and cooperative with exam Eyes: No scleral injection.  HENT: No OP obstruction.  Head: Normocephalic.  Cardiovascular: Regular rate and rhythm on EMS monitor  Respiratory: Effort normal, non-labored breathing on room air GI: Soft.  No distension. There is no tenderness. Skin: WDI.   Neurologic  Examination  Mental Status: Patient is awake, alert, oriented to person, place, month, year, and situation. Patient is able to give a clear and coherent history. No signs of aphasia or neglect.  Speech is mildly dysarthric and slightly slowed per patient with significant improvement since EMS activation per patient.  Cranial Nerves: II: Visual Fields are full. Pupils are equal, round, and reactive to light.   III,IV, VI: EOMI without ptosis or gaze preference V: Facial sensation is intact and symmetric to light touch VII: Subtle right mouth asymmetry noted VIII: Hearing is intact to voice X: Palate elevates  symmetrically, mildly hypophonic speech XI: Shoulder shrug is symmetric. XII: Tongue protrudes midline  Motor: Tone is normal. Bulk is normal. 5/5 strength was present in all four extremities.  Sensory: Sensation is symmetric to light touch and temperature in the arms and legs. No extinction to DSS present.  Cerebellar: FNF and HKS are intact bilaterally Gait: Deferred in the acute setting   Labs/Imaging/Neurodiagnostic studies   CBC:  Recent Labs  Lab 2023-10-14 0828 10-14-2023 0833  WBC 5.1  --   NEUTROABS 2.9  --   HGB 14.0 14.6  HCT 45.6 43.0  MCV 88.9  --   PLT 217  --    Basic Metabolic Panel:  Lab Results  Component Value Date   NA 142 October 14, 2023   K 4.5 Oct 14, 2023   CO2 23 09/07/2023   GLUCOSE 117 (H) 14-Oct-2023   BUN 27 (H) 10-14-23   CREATININE 1.30 (H) 14-Oct-2023   CALCIUM  9.8 09/07/2023   GFRNONAA >60 09/07/2023   GFRAA >60 12/08/2016   Lipid Panel:  Lab Results  Component Value Date   LDLCALC 105 (H) 09/08/2023   HgbA1c:  Lab Results  Component Value Date   HGBA1C 6.4 (H) 09/07/2023    Alcohol  Level     Component Value Date/Time   ETH <10 09/07/2023 1056   INR  Lab Results  Component Value Date   INR 1.0 2023-10-14   APTT  Lab Results  Component Value Date   APTT 25 10-14-23   AED levels: No results found for: PHENYTOIN,  ZONISAMIDE, LAMOTRIGINE, LEVETIRACETA  CT Head without contrast(Personally reviewed): Known acute/subacute cortical infarcts within the left parietal and left occipital lobes. No acute intracranial hemorrhage or interval acute demarcated cortical infarct. Background advanced chronic small vessel ischemic disease. Left sphenoid sinusitis.   CT angio Head (Personally reviewed): No emergent LVO. Non-stenotic atherosclerotic plaque within the intracranial internal carotid arteries.   CT angio neck (Personally reviewed): The common carotid, internal carotid, and vertebral arteries are patent within the neck without stenosis. Minimal atherosclerotic plaque within the proximal right ICA. Non-stenotic atherosclerotic plaque at the right vertebral artery origin. Aortic atherosclerosis.   CT perfusion: The perfusion software identifies no core infarct. The perfusion software identifies no critically hypoperfused parenchyma (utilizing the Tmax>6 seconds threshold). No mismatch volume reported.  MRI Brain without contrast ordered, pending  ASSESSMENT   Charles Frost is a 78 y.o. 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 Rightward slump without right-sided weakness, and worsening dysarthria present on waking this morning with LKW 09/14/23 at 20:00.  Patient's speech significantly improved prior to ED arrival per EMS.  Initial CT, CTA, and CTP without acute findings.  Patient not a TNKase  candidate as he received thrombolytic therapy 10/08/22 with dysarthria at that time as well with some mild residual dysarthria at discharge. Presentation may represent recrudescence in previous stroke symptoms due to  systemic issue, presyncope in acute HFrEF patient, toxic-metabolic or infectious derangements, or evolution of recent infarct.  Will obtain MRI brain without contrast to rule out the latter.   RECOMMENDATIONS  - MRI brain without contrast - If MRI brain is negative for acute abnormality, no further inpatient work up is indicated at this time - Syncope work up,  evaluation and treatment of toxic-metabolic or infectious processes per EDP including UA, CXR, electrolytes - Discussed plan with EDP at bedside     Addendum MRI of the brain reveals new strokes in the left parietal occipital area. Given the presence of new strokes, I would recommend admission to the hospitalist. No need to repeat the full stroke workup but I would recommend getting a TEE. Would recommend consideration for starting anticoagulation since he has had recurrent strokes even while on DAPT for the last 1 week given significantly low EF.  For today, continue aspirin  and Plavix . Continue home statin No need to repeat A1c or lipid panel. We will reach out to the cardiology team to put him on schedule for TEE. Stroke team to follow with you. ______________________________________________________________________  Signed, Mimi LELON Ny, NP [facility] Neuro: [phone]   Attending Neurohospitalist Addendum Patient seen and examined with APP/Resident. Agree with the history and physical as documented above. Agree with the plan as documented, which I helped formulate. I have independently reviewed the chart, obtained history, review of systems and examined the patient.I have personally reviewed pertinent head/neck/spine imaging (CT/MRI). Please feel free to call with any questions.  -- Eligio Lav, MD Neurologist [facility] Pager: [phone]

## 2023-09-15 NOTE — Progress Notes (Signed)
   [facility] has been requested to perform a transesophageal echocardiogram on Charles Frost for stroke workup.     The patient does NOT have any absolute or relative contraindications to a Transesophageal Echocardiogram (TEE).  The patient has: History of Reduced Ejection Fraction-- Echo 12/30 showed EF 25-30%   After careful review of history and examination, the risks and benefits of transesophageal echocardiogram have been explained including risks of esophageal damage, perforation (1:10,000 risk), bleeding, pharyngeal hematoma as well as other potential complications associated with conscious sedation including aspiration, arrhythmia, respiratory failure and death. Alternatives to treatment were discussed, questions were answered. Patient is willing to proceed.   Signed, Rollo FABIENE Louder, PA-C  09/15/2023 3:29 PM

## 2023-09-16 ENCOUNTER — Inpatient Hospital Stay (HOSPITAL_COMMUNITY): Payer: Medicare HMO | Admitting: Anesthesiology

## 2023-09-16 ENCOUNTER — Inpatient Hospital Stay (HOSPITAL_COMMUNITY): Payer: Medicare HMO

## 2023-09-16 ENCOUNTER — Encounter (HOSPITAL_COMMUNITY)
Admission: EM | Disposition: A | Discharge status: Home / Self Care | Payer: Self-pay | Source: Home / Self Care | Attending: Family Medicine

## 2023-09-16 ENCOUNTER — Other Ambulatory Visit (HOSPITAL_COMMUNITY): Payer: Self-pay

## 2023-09-16 DIAGNOSIS — I5020 Unspecified systolic (congestive) heart failure: Secondary | ICD-10-CM | POA: Diagnosis not present

## 2023-09-16 DIAGNOSIS — I639 Cerebral infarction, unspecified: Secondary | ICD-10-CM | POA: Diagnosis not present

## 2023-09-16 DIAGNOSIS — I63433 Cerebral infarction due to embolism of bilateral posterior cerebral arteries: Secondary | ICD-10-CM

## 2023-09-16 DIAGNOSIS — Q2111 Secundum atrial septal defect: Secondary | ICD-10-CM

## 2023-09-16 DIAGNOSIS — I110 Hypertensive heart disease with heart failure: Secondary | ICD-10-CM | POA: Diagnosis not present

## 2023-09-16 DIAGNOSIS — E7800 Pure hypercholesterolemia, unspecified: Secondary | ICD-10-CM | POA: Diagnosis not present

## 2023-09-16 DIAGNOSIS — I5022 Chronic systolic (congestive) heart failure: Secondary | ICD-10-CM | POA: Diagnosis not present

## 2023-09-16 DIAGNOSIS — I10 Essential (primary) hypertension: Secondary | ICD-10-CM | POA: Diagnosis not present

## 2023-09-16 DIAGNOSIS — I63412 Cerebral infarction due to embolism of left middle cerebral artery: Secondary | ICD-10-CM | POA: Diagnosis not present

## 2023-09-16 HISTORY — PX: TRANSESOPHAGEAL ECHOCARDIOGRAM (CATH LAB): EP1270

## 2023-09-16 LAB — CBC
HCT: 41.2 % (ref 39.0–52.0)
Hemoglobin: 12.9 g/dL — ABNORMAL LOW (ref 13.0–17.0)
MCH: 26.9 pg (ref 26.0–34.0)
MCHC: 31.3 g/dL (ref 30.0–36.0)
MCV: 85.8 fL (ref 80.0–100.0)
Platelets: 216 10*3/uL (ref 150–400)
RBC: 4.8 MIL/uL (ref 4.22–5.81)
RDW: 14.9 % (ref 11.5–15.5)
WBC: 6.2 10*3/uL (ref 4.0–10.5)
nRBC: 0 % (ref 0.0–0.2)

## 2023-09-16 LAB — BASIC METABOLIC PANEL
Anion gap: 12 (ref 5–15)
BUN: 25 mg/dL — ABNORMAL HIGH (ref 8–23)
CO2: 24 mmol/L (ref 22–32)
Calcium: 9.2 mg/dL (ref 8.9–10.3)
Chloride: 105 mmol/L (ref 98–111)
Creatinine, Ser: 1.25 mg/dL — ABNORMAL HIGH (ref 0.61–1.24)
GFR, Estimated: >60 mL/min (ref >60)
Glucose, Bld: 107 mg/dL — ABNORMAL HIGH (ref 70–99)
Potassium: 4.4 mmol/L (ref 3.5–5.1)
Sodium: 141 mmol/L (ref 135–145)

## 2023-09-16 LAB — MAGNESIUM: Magnesium: 2.1 mg/dL (ref 1.7–2.4)

## 2023-09-16 LAB — ECHO TEE

## 2023-09-16 SURGERY — TRANSESOPHAGEAL ECHOCARDIOGRAM (TEE) (CATHLAB)
Anesthesia: Monitor Anesthesia Care

## 2023-09-16 MED ORDER — SACUBITRIL-VALSARTAN 24-26 MG PO TABS
1.0000 | ORAL_TABLET | Freq: Two times a day (BID) | ORAL | Status: DC
Start: 2023-09-16 — End: 2023-09-17
  Administered 2023-09-16 – 2023-09-17 (×2): 1 via ORAL
  Filled 2023-09-16 (×2): qty 1

## 2023-09-16 MED ORDER — LIDOCAINE 2% (20 MG/ML) 5 ML SYRINGE
INTRAMUSCULAR | Status: DC | PRN
  Administered 2023-09-16: 40 mg via INTRAVENOUS

## 2023-09-16 MED ORDER — PROPOFOL 500 MG/50ML IV EMUL
INTRAVENOUS | Status: DC | PRN
  Administered 2023-09-16: 50 ug/kg/min via INTRAVENOUS

## 2023-09-16 MED ORDER — LIDOCAINE VISCOUS HCL 2 % MT SOLN
OROMUCOSAL | Status: AC
  Filled 2023-09-16: qty 15

## 2023-09-16 MED ORDER — SODIUM CHLORIDE 0.9 % IV SOLN: INTRAVENOUS | Status: DC | PRN

## 2023-09-16 MED ORDER — SACUBITRIL-VALSARTAN 24-26 MG PO TABS
1.0000 | ORAL_TABLET | Freq: Two times a day (BID) | ORAL | 1 refills | Status: DC
  Filled 2023-09-16: qty 60, 30d supply, fill #0

## 2023-09-16 MED ORDER — PROPOFOL 10 MG/ML IV BOLUS
INTRAVENOUS | Status: DC | PRN
  Administered 2023-09-16 (×3): 20 mg via INTRAVENOUS
  Administered 2023-09-16: 30 mg via INTRAVENOUS
  Administered 2023-09-16: 20 mg via INTRAVENOUS
  Administered 2023-09-16: 10 mg via INTRAVENOUS

## 2023-09-16 MED ORDER — LIDOCAINE HCL URETHRAL/MUCOSAL 2 % EX GEL
CUTANEOUS | Status: DC | PRN
  Administered 2023-09-16: 1 via TOPICAL

## 2023-09-16 MED ORDER — APIXABAN 5 MG PO TABS
5.0000 mg | ORAL_TABLET | Freq: Two times a day (BID) | ORAL | 1 refills | Status: DC
  Filled 2023-09-16: qty 60, 30d supply, fill #0

## 2023-09-16 MED ORDER — APIXABAN 5 MG PO TABS
5.0000 mg | ORAL_TABLET | Freq: Two times a day (BID) | ORAL | Status: DC
  Administered 2023-09-16 – 2023-09-17 (×2): 5 mg via ORAL
  Filled 2023-09-16 (×2): qty 1

## 2023-09-16 NOTE — Progress Notes (Signed)
Pt off unit for TEE.

## 2023-09-16 NOTE — Transfer of Care (Signed)
 Immediate Anesthesia Transfer of Care Note  Patient: Charles Frost  Procedure(s) Performed: TRANSESOPHAGEAL ECHOCARDIOGRAM  Patient Location: PACU and Cath Lab  Anesthesia Type:MAC  Level of Consciousness: drowsy and patient cooperative  Airway & Oxygen  Therapy: Patient Spontanous Breathing  Post-op Assessment: Report given to RN and Post -op Vital signs reviewed and stable  Post vital signs: Reviewed and stable  Last Vitals:  Vitals Value Taken Time  BP    Temp    Pulse    Resp    SpO2      Last Pain:  Vitals:   09/16/23 1037  TempSrc: Temporal  PainSc: 0-No pain         Complications: No notable events documented.

## 2023-09-16 NOTE — Progress Notes (Signed)
 Pt returned to room

## 2023-09-16 NOTE — Anesthesia Preprocedure Evaluation (Addendum)
 Anesthesia Evaluation  Patient identified by MRN, date of birth, ID band Patient awake    Reviewed: Allergy & Precautions, H&P , NPO status , Patient's Chart, lab work & pertinent test results, reviewed documented beta blocker date and time   Airway Mallampati: II  TM Distance: >3 FB Neck ROM: Full    Dental no notable dental hx. (+) Teeth Intact, Dental Advisory Given   Pulmonary former smoker   Pulmonary exam normal breath sounds clear to auscultation       Cardiovascular hypertension, Pt. on medications and Pt. on home beta blockers  Rhythm:Regular Rate:Normal     Neuro/Psych CVA, No Residual Symptoms  negative psych ROS   GI/Hepatic Neg liver ROS,GERD  Medicated,,  Endo/Other  negative endocrine ROS    Renal/GU negative Renal ROS  negative genitourinary   Musculoskeletal  (+) Arthritis , Osteoarthritis,    Abdominal   Peds  Hematology  (+) Blood dyscrasia, anemia   Anesthesia Other Findings   Reproductive/Obstetrics negative OB ROS                             Anesthesia Physical Anesthesia Plan  ASA: 4  Anesthesia Plan: MAC   Post-op Pain Management: Minimal or no pain anticipated   Induction: Intravenous  PONV Risk Score and Plan: 1 and Propofol  infusion  Airway Management Planned: Natural Airway and Simple Face Mask  Additional Equipment:   Intra-op Plan:   Post-operative Plan:   Informed Consent: I have reviewed the patients History and Physical, chart, labs and discussed the procedure including the risks, benefits and alternatives for the proposed anesthesia with the patient or authorized representative who has indicated his/her understanding and acceptance.     Dental advisory given  Plan Discussed with: CRNA  Anesthesia Plan Comments:        Anesthesia Quick Evaluation

## 2023-09-16 NOTE — Discharge Instructions (Addendum)
 Dear Charles Frost,  Thank you for letting us  participate in your care. You were hospitalized for slurred speech and stroke like symptoms and diagnosed with Stroke (HCC).   POST-HOSPITAL & CARE INSTRUCTIONS Please take Eliquis  5 mg twice daily STOP taking aspirin  and plavix  Keep taking Crestor  Follow-up with your PCP for incidental liver findings on your CT scan Go to your follow up appointments (listed below)   DOCTOR'S APPOINTMENT   Future Appointments  Date Time Provider Department Center  09/28/2023 10:20 AM Ladona Heinz, MD [facility]    Follow-up Information     [facility]. Schedule an appointment as soon as possible for a visit.   Specialty: Rehabilitation Contact information: [address] [place]  [address] [phone]                Take care and be well!  Family Medicine Teaching Service Inpatient Team [place]  [facility]  [address] [place], [place] [address] [phone]   Information on my medicine - ELIQUIS  (apixaban )  Why was Eliquis  prescribed for you? Eliquis  was prescribed for you to reduce the risk of a blood clot forming that can cause a stroke if you have a medical condition called atrial fibrillation (a type of irregular heartbeat).  What do You need to know about Eliquis  ? Take your Eliquis  TWICE DAILY - one tablet in the morning and one tablet in the evening with or without food. If you have difficulty swallowing the tablet whole please discuss with your pharmacist how to take the medication safely.  Take Eliquis  exactly as prescribed by your doctor and DO NOT stop taking Eliquis  without talking to the doctor who prescribed the medication.  Stopping may increase your risk of developing a stroke.  Refill your prescription before you run out.  After discharge, you should have regular check-up appointments with your healthcare provider that is  prescribing your Eliquis .  In the future your dose may need to be changed if your kidney function or weight changes by a significant amount or as you get older.  What do you do if you miss a dose? If you miss a dose, take it as soon as you remember on the same day and resume taking twice daily.  Do not take more than one dose of ELIQUIS  at the same time to make up a missed dose.  Important Safety Information A possible side effect of Eliquis  is bleeding. You should call your healthcare provider right away if you experience any of the following: Bleeding from an injury or your nose that does not stop. Unusual colored urine (red or dark brown) or unusual colored stools (red or black). Unusual bruising for unknown reasons. A serious fall or if you hit your head (even if there is no bleeding).  Some medicines may interact with Eliquis  and might increase your risk of bleeding or clotting while on Eliquis . To help avoid this, consult your healthcare provider or pharmacist prior to using any new prescription or non-prescription medications, including herbals, vitamins, non-steroidal anti-inflammatory drugs (NSAIDs) and supplements.  This website has more information on Eliquis  (apixaban ): [email or web address] .[email or web address] dena

## 2023-09-16 NOTE — Progress Notes (Signed)
 STROKE TEAM PROGRESS NOTE   SUBJECTIVE (INTERVAL HISTORY) His wife is at the bedside.  Overall his condition is stable. He is sitting at the edge of bed, had TEE today showed no LV thrombus but large PFO. No DVT.    OBJECTIVE Temp:  [97.9 F (36.6 C)-98.8 F (37.1 C)] 98.3 F (36.8 C) (01/08 1547) Pulse Rate:  [74-90] 74 (01/08 1547) Cardiac Rhythm: Normal sinus rhythm (01/08 1229) Resp:  [17-30] 18 (01/08 1547) BP: (99-145)/(67-98) 107/78 (01/08 1547) SpO2:  [93 %-98 %] 94 % (01/08 1547) Weight:  [80.4 kg] 80.4 kg (01/08 0800)  Recent Labs  Lab 09/15/23 0826  GLUCAP 122*   Recent Labs  Lab 09/15/23 0828 09/15/23 0833 09/16/23 0541  NA 140 142 141  K 4.7 4.5 4.4  CL 106 107 105  CO2 24  --  24  GLUCOSE 124* 117* 107*  BUN 21 27* 25*  CREATININE 1.34* 1.30* 1.25*  CALCIUM  9.5  --  9.2  MG  --   --  2.1   Recent Labs  Lab 09/15/23 0828  AST 24  ALT 20  ALKPHOS 47  BILITOT 0.7  PROT 6.8  ALBUMIN 3.7   Recent Labs  Lab 09/15/23 0828 09/15/23 0833 09/16/23 0541  WBC 5.1  --  6.2  NEUTROABS 2.9  --   --   HGB 14.0 14.6 12.9*  HCT 45.6 43.0 41.2  MCV 88.9  --  85.8  PLT 217  --  216   No results for input(s): CKTOTAL, CKMB, CKMBINDEX, TROPONINI in the last 168 hours. Recent Labs    09/15/23 0828  LABPROT 13.4  INR 1.0   No results for input(s): COLORURINE, LABSPEC, PHURINE, GLUCOSEU, HGBUR, BILIRUBINUR, KETONESUR, PROTEINUR, UROBILINOGEN, NITRITE, LEUKOCYTESUR in the last 72 hours.  Invalid input(s): APPERANCEUR     Component Value Date/Time   CHOL 162 09/08/2023 0629   TRIG 29 09/08/2023 0629   HDL 51 09/08/2023 0629   CHOLHDL 3.2 09/08/2023 0629   VLDL 6 09/08/2023 0629   LDLCALC 105 (H) 09/08/2023 0629   Lab Results  Component Value Date   HGBA1C 6.4 (H) 09/07/2023   No results found for: LABOPIA, COCAINSCRNUR, LABBENZ, AMPHETMU, THCU, LABBARB  Recent Labs  Lab 09/15/23 0828  ETH <10    I  have personally reviewed the radiological images below and agree with the radiology interpretations.  VAS US  LOWER EXTREMITY VENOUS (DVT) Result Date: 09/16/2023  Lower Venous DVT Study Patient Name:  Charles Frost  Date of Exam:   09/16/2023 Medical Rec #: [id number]       Accession #:    [id number] Date of Birth: 1946-04-23      Patient Gender: M Patient Age:   78 years Exam Location:  [facility] Procedure:      VAS US  LOWER EXTREMITY VENOUS (DVT) Referring Phys: ARY CUMMINS --------------------------------------------------------------------------------  Indications: Stroke, and Embolic infarction.  Risk Factors: Cancer Prostate CA. Comparison Study: No prior exam. Performing Technologist: Edilia Elden Appl  Examination Guidelines: A complete evaluation includes B-mode imaging, spectral Doppler, color Doppler, and power Doppler as needed of all accessible portions of each vessel. Bilateral testing is considered an integral part of a complete examination. Limited examinations for reoccurring indications may be performed as noted. The reflux portion of the exam is performed with the patient in reverse Trendelenburg.  +---------+---------------+---------+-----------+----------+--------------+ RIGHT    CompressibilityPhasicitySpontaneityPropertiesThrombus Aging +---------+---------------+---------+-----------+----------+--------------+ CFV      Full  Yes      Yes                                 +---------+---------------+---------+-----------+----------+--------------+ SFJ      Full           Yes      Yes                                 +---------+---------------+---------+-----------+----------+--------------+ FV Prox  Full                                                        +---------+---------------+---------+-----------+----------+--------------+ FV Mid   Full                                                         +---------+---------------+---------+-----------+----------+--------------+ FV DistalFull                                                        +---------+---------------+---------+-----------+----------+--------------+ PFV      Full                                                        +---------+---------------+---------+-----------+----------+--------------+ POP      Full           Yes      Yes                                 +---------+---------------+---------+-----------+----------+--------------+ PTV      Full                                                        +---------+---------------+---------+-----------+----------+--------------+ PERO     Full                                                        +---------+---------------+---------+-----------+----------+--------------+   +---------+---------------+---------+-----------+----------+--------------+ LEFT     CompressibilityPhasicitySpontaneityPropertiesThrombus Aging +---------+---------------+---------+-----------+----------+--------------+ CFV      Full           Yes      Yes                                 +---------+---------------+---------+-----------+----------+--------------+ SFJ      Full  Yes      Yes                                 +---------+---------------+---------+-----------+----------+--------------+ FV Prox  Full                                                        +---------+---------------+---------+-----------+----------+--------------+ FV Mid   Full                                                        +---------+---------------+---------+-----------+----------+--------------+ FV DistalFull                                                        +---------+---------------+---------+-----------+----------+--------------+ PFV      Full                                                         +---------+---------------+---------+-----------+----------+--------------+ POP      Full           Yes      Yes                                 +---------+---------------+---------+-----------+----------+--------------+ PTV      Full                                                        +---------+---------------+---------+-----------+----------+--------------+ PERO     Full                                                        +---------+---------------+---------+-----------+----------+--------------+     Summary: BILATERAL: - No evidence of deep vein thrombosis seen in the lower extremities, bilaterally. -No evidence of popliteal cyst, bilaterally.   *See table(s) above for measurements and observations.    Preliminary    ECHO TEE Result Date: 09/16/2023    TRANSESOPHOGEAL ECHO REPORT   Patient Name:   CLAYBORN MILNES Date of Exam: 09/16/2023 Medical Rec #:  [id number]      Height:       68.0 in Accession #:    [id number]     Weight:       177.2 lb Date of Birth:  Apr 20, 1946     BSA:          1.941 m Patient Age:    54 years  BP:           151/92 mmHg Patient Gender: M              HR:           98 bpm. Exam Location:  Inpatient Procedure: Cardiac Doppler, Color Doppler, 3D Echo, Transesophageal Echo and            Saline Contrast Bubble Study Indications:     Stroke  History:         Patient has prior history of Echocardiogram examinations, most                  recent 09/07/2023. Stroke.  Sonographer:     Tinnie Gosling RDCS Referring Phys:  [id number] ROLLO JONELLE LOUDER Diagnosing Phys: Shelda Bruckner MD PROCEDURE: After discussion of the risks and benefits of a TEE, an informed consent was obtained from the patient. The transesophogeal probe was passed without difficulty through the esophogus of the patient. Local oropharyngeal anesthetic was provided with viscous lidocaine . Sedation performed by different physician. Image quality was excellent. The patient's vital signs;  including heart rate, blood pressure, and oxygen  saturation; remained stable throughout the procedure. The patient developed no complications during the procedure.  IMPRESSIONS  1. Left ventricular ejection fraction, by estimation, is 25 to 30%. The left ventricle has severely decreased function.  2. Right ventricular systolic function is normal. The right ventricular size is normal.  3. Left atrial size was mildly dilated. No left atrial/left atrial appendage thrombus was detected. The LAA emptying velocity was 60 cm/s.  4. The mitral valve is normal in structure. Trivial mitral valve regurgitation. No evidence of mitral stenosis.  5. The aortic valve is tricuspid. Aortic valve regurgitation is not visualized. No aortic stenosis is present.  6. There is mild (Grade II) plaque involving the descending aorta.  7. Evidence of atrial level shunting detected by color flow Doppler. Agitated saline contrast bubble study was positive with shunting observed within 3-6 cardiac cycles suggestive of interatrial shunt. There is a moderately sized secundum atrial septal defect with bidirectional shunting across the atrial septum. Conclusion(s)/Recommendation(s): Findings are concerning for an interatrial shunt as detailed above. FINDINGS  Left Ventricle: Left ventricular ejection fraction, by estimation, is 25 to 30%. The left ventricle has severely decreased function. The left ventricular internal cavity size was normal in size. Right Ventricle: The right ventricular size is normal. No increase in right ventricular wall thickness. Right ventricular systolic function is normal. Left Atrium: Left atrial size was mildly dilated. No left atrial/left atrial appendage thrombus was detected. The LAA emptying velocity was 60 cm/s. Right Atrium: Right atrial size was normal in size. Pericardium: There is no evidence of pericardial effusion. Mitral Valve: The mitral valve is normal in structure. Trivial mitral valve regurgitation. No  evidence of mitral valve stenosis. There is no evidence of mitral valve vegetation. Tricuspid Valve: The tricuspid valve is normal in structure. Tricuspid valve regurgitation is mild . No evidence of tricuspid stenosis. There is no evidence of tricuspid valve vegetation. Aortic Valve: The aortic valve is tricuspid. Aortic valve regurgitation is not visualized. No aortic stenosis is present. Pulmonic Valve: The pulmonic valve was grossly normal. Pulmonic valve regurgitation is not visualized. No evidence of pulmonic stenosis. There is no evidence of pulmonic valve vegetation. Aorta: The aortic root and ascending aorta are structurally normal, with no evidence of dilitation. There is mild (Grade II) plaque involving the descending aorta. IAS/Shunts: Evidence of atrial level shunting detected by  color flow Doppler. Agitated saline contrast was given intravenously to evaluate for intracardiac shunting. Agitated saline contrast bubble study was positive with shunting observed within 3-6 cardiac cycles suggestive of interatrial shunt. There is no evidence of an atrial septal defect. There is a moderately sized secundum atrial septal defect with bidirectional shunting across the atrial septum. Additional Comments: Spectral Doppler performed. TRICUSPID VALVE TR Peak grad:   29.2 mmHg TR Vmax:        270.00 cm/s Shelda Bruckner MD Electronically signed by Shelda Bruckner MD Signature Date/Time: 09/16/2023/2:22:28 PM    Final    EP STUDY Result Date: 09/16/2023 See surgical note for result.  DG Chest 1 View Result Date: 09/15/2023 CLINICAL DATA:  Dizziness. EXAM: CHEST  1 VIEW COMPARISON:  None Available. FINDINGS: No focal consolidation, pleural effusion, or pneumothorax. The cardiac silhouette is within normal limits. No acute osseous pathology. IMPRESSION: No active disease. Electronically Signed   By: Vanetta Chou M.D.   On: 09/15/2023 14:38   MR BRAIN WO CONTRAST Result Date: 09/15/2023 CLINICAL DATA:   Neuro deficit, acute, stroke suspected. EXAM: MRI HEAD WITHOUT CONTRAST TECHNIQUE: Multiplanar, multiecho pulse sequences of the brain and surrounding structures were obtained without intravenous contrast. COMPARISON:  CT studies earlier same day.  MRI 09/07/2023. FINDINGS: Brain: Newly seen cluster of small acute infarctions affecting the cortical brain in the left occipital region. Previously seen acute infarction just anterior to that does not show any progression. Newly seen foci of acute cortical infarction in the posterior left parietal lobe. Findings consistent with recurrent embolic infarctions. Chronic small-vessel ischemic changes remain evident within the pons. Numerous old small vessel cerebellar infarctions. Old infarction of the right occipital cortical and subcortical brain. Advanced chronic small-vessel ischemic changes throughout the cerebral hemispheric white matter. No evidence of acute hemorrhage, hydrocephalus or extra-axial collection. Chronic hemosiderin deposition associated with many of the old infarctions. Vascular: Major vessels at the base of the brain show flow. Skull and upper cervical spine: Negative Sinuses/Orbits: Clear/normal Other: None IMPRESSION: 1. Newly seen cluster of small acute infarctions affecting the cortical brain in the left occipital region. Previously seen acute infarction just anterior to that does not show any progression. Newly seen foci of acute cortical infarction in the posterior left parietal lobe. Findings consistent with recurrent embolic infarctions. 2. Advanced chronic small-vessel ischemic changes elsewhere in the brain as outlined above. Electronically Signed   By: Oneil Officer M.D.   On: 09/15/2023 12:09   CT ABDOMEN PELVIS W CONTRAST Result Date: 09/15/2023 CLINICAL DATA:  LLQ abdominal pain. EXAM: CT ABDOMEN AND PELVIS WITH CONTRAST TECHNIQUE: Multidetector CT imaging of the abdomen and pelvis was performed using the standard protocol following  bolus administration of intravenous contrast. RADIATION DOSE REDUCTION: This exam was performed according to the departmental dose-optimization program which includes automated exposure control, adjustment of the mA and/or kV according to patient size and/or use of iterative reconstruction technique. CONTRAST:  65mL OMNIPAQUE  IOHEXOL  350 MG/ML SOLN COMPARISON:  CT scan renal stone protocol 12/04/2016. FINDINGS: Examination is suboptimal due to patient's motion during data acquisition. Lower chest: The lung bases are clear. No pleural effusion. The heart is normal in size. No pericardial effusion. Hepatobiliary: The liver is normal in size. Non-cirrhotic configuration. No suspicious mass. Redemonstration of 2 subcapsular hypoattenuating structures in the right hepatic lobe, segments 5/6 which are not well evaluated due to extensive motion at these levels. However, these lesions were smaller but present on the prior examination from 2018. Consider further evaluation  with nonemergent contrast-enhanced MRI abdomen as per liver mass protocol. No intrahepatic or extrahepatic bile duct dilation. No calcified gallstones. Normal gallbladder wall thickness. No pericholecystic inflammatory changes. Pancreas: Small focus of sub 5 mm dystrophic calcification in the uncinate process is nonspecific but can be seen as a sequela of previous episodes of chronic pancreatitis. Otherwise no suspicious pancreatic lesion. No pancreatic ductal dilatation or surrounding inflammatory changes. Spleen: Within normal limits. No focal lesion. Adrenals/Urinary Tract: Limited evaluation due to extensive motion at this level. Multiple areas of scarring noted in the left kidney. There is excreted contrast in bilateral renal collecting systems, which limits evaluation for nonobstructing calculi. No hydroureteronephrosis on either side. Urinary bladder is under distended, precluding optimal assessment. However, no large mass or stones identified. No  perivesical fat stranding. Stomach/Bowel: No disproportionate dilation of the small or large bowel loops. No evidence of abnormal bowel wall thickening or inflammatory changes. The appendix is unremarkable. There are multiple diverticula throughout the colon, without imaging signs of diverticulitis. Vascular/Lymphatic: No ascites or pneumoperitoneum. No abdominal or pelvic lymphadenopathy, by size criteria. No aneurysmal dilation of the major abdominal arteries. There are moderate peripheral atherosclerotic vascular calcifications of the aorta and its major branches. Reproductive: Enlarged prostate. Symmetric seminal vesicles. Other: There are small fat containing umbilical and left inguinal hernias. The soft tissues and abdominal wall are otherwise unremarkable. Musculoskeletal: No suspicious osseous lesions. There are mild multilevel degenerative changes in the visualized spine. IMPRESSION: 1. Examination is degraded by motion. 2. No acute inflammatory process identified within the abdomen or pelvis. 3. There are 2, slowly growing subcapsular hypoattenuating structures in the right hepatic lobe, segments 5/6, not well evaluated on this exam. Consider nonemergent contrast-enhanced MRI abdomen as per liver mass protocol for better characterization. 4. Multiple other nonacute observations, as described above. Electronically Signed   By: Ree Molt M.D.   On: 09/15/2023 10:53   CT HEAD CODE STROKE WO CONTRAST Result Date: 09/15/2023 CLINICAL DATA:  Provided history: Neuro deficit, acute, stroke suspected. EXAM: CT ANGIOGRAPHY HEAD AND NECK CT PERFUSION BRAIN TECHNIQUE: Multidetector CT imaging of the head and neck was performed using the standard protocol during bolus administration of intravenous contrast. Multiplanar CT image reconstructions and MIPs were obtained to evaluate the vascular anatomy. Carotid stenosis measurements (when applicable) are obtained utilizing NASCET criteria, using the distal internal  carotid diameter as the denominator. Multiphase CT imaging of the brain was performed following IV bolus contrast injection. Subsequent parametric perfusion maps were calculated using RAPID software. RADIATION DOSE REDUCTION: This exam was performed according to the departmental dose-optimization program which includes automated exposure control, adjustment of the mA and/or kV according to patient size and/or use of iterative reconstruction technique. CONTRAST:  Administered contrast not known at this time. COMPARISON:  Noncontrast head CT 09/08/2023. CT angiogram head/neck 09/07/2023. Brain MRI 09/07/2023. FINDINGS: CT HEAD FINDINGS Brain: No age advanced or lobar predominant parenchymal atrophy. Known small acute/subacute cortical/subcortical infarcts within the left parietal and left occipital lobes. Chronic cortical/subcortical infarct within the right occipital lobe. Chronic lacunar infarct within the left parietal white matter. Background advanced patchy and ill-defined hypoattenuation within the cerebral white matter, nonspecific but compatible chronic small vessel disease. Small chronic infarcts within the bilateral cerebellar hemispheres. No interval acute demarcated cortical infarct is identified. No acute intracranial hemorrhage. No evidence of an intracranial mass. No chronic intracranial blood products. No extra-axial fluid collection. No midline shift. Vascular: No hyperdense vessel. Atherosclerotic calcifications. Skull: No calvarial fracture or aggressive osseous lesion. Sinuses/Orbits:  No orbital mass or acute orbital finding. Small volume frothy secretions within the left sphenoid sinus. ASPECTS ([place] Stroke Program Early CT Score) - Ganglionic level infarction (caudate, lentiform nuclei, internal capsule, insula, M1-M3 cortex): 7 - Supraganglionic infarction (M4-M6 cortex): 3 Total score (0-10 with 10 being normal): 10 (when discounting the previously demonstrated, known acute/subacute  cortically-based infarcts described above). No evidence of interval acute intracranial abnormality. These results were communicated to Dr. Arora At 8:51 amon 1/7/2025by text page via the [facility] messaging system. Review of the MIP images confirms the above findings CTA NECK FINDINGS Aortic arch: Standard aortic branching. Atherosclerotic plaque within the visualized thoracic aorta and proximal major branch vessels of the neck. Streak/beam hardening artifact arising from a dense left-sided contrast bolus partially obscures the left subclavian artery. Within this limitation, there is no appreciable hemodynamically significant innominate or proximal subclavian artery stenosis. Right carotid system: CCA and ICA patent within the neck without stenosis. Minimal atherosclerotic plaque within the proximal ICA. Partially retropharyngeal course of the cervical ICA. Left carotid system: CCA and ICA patent within the neck without stenosis or significant atherosclerotic disease. Vertebral arteries: Codominant and patent within the neck without stenosis. Nonstenotic atherosclerotic plaque at the right vertebral artery origin. Skeleton: Cervical spondylosis. No acute fracture or aggressive osseous lesion. Other neck: No neck mass or cervical lymphadenopathy. Subcentimeter thyroid  nodules not meeting consensus criteria for ultrasound follow-up based on size. No follow-up imaging recommended. Reference: J Am Coll Radiol. 2015 Feb;12(2): 143-50. Upper chest: No consolidation within the imaged lung apices. Review of the MIP images confirms the above findings CTA HEAD FINDINGS Anterior circulation: The intracranial internal carotid arteries are patent. Nonstenotic atherosclerotic plaque within both vessels The M1 middle cerebral arteries are patent. No M2 proximal branch occlusion or high-grade proximal stenosis. The anterior cerebral arteries are patent. No intracranial aneurysm is identified. Posterior circulation: The intracranial  vertebral arteries are patent. The basilar artery is patent. The posterior cerebral arteries are patent. Hypoplastic right P1 segment with sizable right posterior communicating artery. The left posterior communicating artery is diminutive or absent. Venous sinuses: Within the limitations of contrast timing, no convincing thrombus. Anatomic variants: As described. Review of the MIP images confirms the above findings No emergent large vessel occlusion identified. These results were communicated to Dr. Voncile at 9:04 amon 1/7/2025by text page via the [facility] messaging system. CT Brain Perfusion Findings: CBF (<30%) Volume: 0mL Perfusion (Tmax>6.0s) volume: 0mL Mismatch Volume: 0mL Infarction Location:None identified via perfusion data. IMPRESSION: Non-contrast head CT: 1. 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 2. No acute intracranial hemorrhage or interval acute demarcated cortical infarct. 3. Background advanced chronic small vessel ischemic disease. 4. Left sphenoid sinusitis. CTA neck: 1. The common carotid, internal carotid and vertebral arteries are patent within the neck without stenosis. Minimal atherosclerotic plaque within the proximal right ICA. Non-stenotic atherosclerotic plaque at the right vertebral artery origin. 2. Aortic Atherosclerosis (ICD10-I70.0). CTA head: 1. No emergent large vessel occlusion identified. 2. Non-stenotic atherosclerotic plaque within the intracranial internal carotid arteries. CT perfusion head: The perfusion software identifies no core infarct. The perfusion software identifies no critically hypoperfused parenchyma (utilizing the Tmax>6 seconds threshold). No mismatch volume reported. Electronically Signed   By: Rockey Childs D.O.   On: 09/15/2023 09:07   CT ANGIO HEAD NECK W WO CM W PERF (CODE STROKE) Result Date: 09/15/2023 CLINICAL DATA:  Provided history: Neuro deficit, acute, stroke suspected. EXAM: CT ANGIOGRAPHY  HEAD AND NECK CT PERFUSION BRAIN  TECHNIQUE: Multidetector CT imaging of the head and neck was performed using the standard protocol during bolus administration of intravenous contrast. Multiplanar CT image reconstructions and MIPs were obtained to evaluate the vascular anatomy. Carotid stenosis measurements (when applicable) are obtained utilizing NASCET criteria, using the distal internal carotid diameter as the denominator. Multiphase CT imaging of the brain was performed following IV bolus contrast injection. Subsequent parametric perfusion maps were calculated using RAPID software. RADIATION DOSE REDUCTION: This exam was performed according to the departmental dose-optimization program which includes automated exposure control, adjustment of the mA and/or kV according to patient size and/or use of iterative reconstruction technique. CONTRAST:  Administered contrast not known at this time. COMPARISON:  Noncontrast head CT 09/08/2023. CT angiogram head/neck 09/07/2023. Brain MRI 09/07/2023. FINDINGS: CT HEAD FINDINGS Brain: No age advanced or lobar predominant parenchymal atrophy. Known small acute/subacute cortical/subcortical infarcts within the left parietal and left occipital lobes. Chronic cortical/subcortical infarct within the right occipital lobe. Chronic lacunar infarct within the left parietal white matter. Background advanced patchy and ill-defined hypoattenuation within the cerebral white matter, nonspecific but compatible chronic small vessel disease. Small chronic infarcts within the bilateral cerebellar hemispheres. No interval acute demarcated cortical infarct is identified. No acute intracranial hemorrhage. No evidence of an intracranial mass. No chronic intracranial blood products. No extra-axial fluid collection. No midline shift. Vascular: No hyperdense vessel. Atherosclerotic calcifications. Skull: No calvarial fracture or aggressive osseous lesion. Sinuses/Orbits: No orbital mass or acute  orbital finding. Small volume frothy secretions within the left sphenoid sinus. ASPECTS ([place] Stroke Program Early CT Score) - Ganglionic level infarction (caudate, lentiform nuclei, internal capsule, insula, M1-M3 cortex): 7 - Supraganglionic infarction (M4-M6 cortex): 3 Total score (0-10 with 10 being normal): 10 (when discounting the previously demonstrated, known acute/subacute cortically-based infarcts described above). No evidence of interval acute intracranial abnormality. These results were communicated to Dr. Arora At 8:51 amon 1/7/2025by text page via the [facility] messaging system. Review of the MIP images confirms the above findings CTA NECK FINDINGS Aortic arch: Standard aortic branching. Atherosclerotic plaque within the visualized thoracic aorta and proximal major branch vessels of the neck. Streak/beam hardening artifact arising from a dense left-sided contrast bolus partially obscures the left subclavian artery. Within this limitation, there is no appreciable hemodynamically significant innominate or proximal subclavian artery stenosis. Right carotid system: CCA and ICA patent within the neck without stenosis. Minimal atherosclerotic plaque within the proximal ICA. Partially retropharyngeal course of the cervical ICA. Left carotid system: CCA and ICA patent within the neck without stenosis or significant atherosclerotic disease. Vertebral arteries: Codominant and patent within the neck without stenosis. Nonstenotic atherosclerotic plaque at the right vertebral artery origin. Skeleton: Cervical spondylosis. No acute fracture or aggressive osseous lesion. Other neck: No neck mass or cervical lymphadenopathy. Subcentimeter thyroid  nodules not meeting consensus criteria for ultrasound follow-up based on size. No follow-up imaging recommended. Reference: J Am Coll Radiol. 2015 Feb;12(2): 143-50. Upper chest: No consolidation within the imaged lung apices. Review of the MIP images confirms the above  findings CTA HEAD FINDINGS Anterior circulation: The intracranial internal carotid arteries are patent. Nonstenotic atherosclerotic plaque within both vessels The M1 middle cerebral arteries are patent. No M2 proximal branch occlusion or high-grade proximal stenosis. The anterior cerebral arteries are patent. No intracranial aneurysm is identified. Posterior circulation: The intracranial vertebral arteries are patent. The basilar artery is patent. The posterior cerebral arteries are patent. Hypoplastic right P1 segment with sizable right posterior communicating artery. The left posterior communicating artery is diminutive or  absent. Venous sinuses: Within the limitations of contrast timing, no convincing thrombus. Anatomic variants: As described. Review of the MIP images confirms the above findings No emergent large vessel occlusion identified. These results were communicated to Dr. Voncile at 9:04 amon 1/7/2025by text page via the [facility] messaging system. CT Brain Perfusion Findings: CBF (<30%) Volume: 0mL Perfusion (Tmax>6.0s) volume: 0mL Mismatch Volume: 0mL Infarction Location:None identified via perfusion data. IMPRESSION: Non-contrast head CT: 1. 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 2. No acute intracranial hemorrhage or interval acute demarcated cortical infarct. 3. Background advanced chronic small vessel ischemic disease. 4. Left sphenoid sinusitis. CTA neck: 1. The common carotid, internal carotid and vertebral arteries are patent within the neck without stenosis. Minimal atherosclerotic plaque within the proximal right ICA. Non-stenotic atherosclerotic plaque at the right vertebral artery origin. 2. Aortic Atherosclerosis (ICD10-I70.0). CTA head: 1. No emergent large vessel occlusion identified. 2. Non-stenotic atherosclerotic plaque within the intracranial internal carotid arteries. CT perfusion head: The perfusion software identifies no core  infarct. The perfusion software identifies no critically hypoperfused parenchyma (utilizing the Tmax>6 seconds threshold). No mismatch volume reported. Electronically Signed   By: Rockey Childs D.O.   On: 09/15/2023 09:07   CT HEAD WO CONTRAST ( ) Result Date: 09/08/2023 CLINICAL DATA:  Stroke follow-up after teen K. EXAM: CT HEAD WITHOUT CONTRAST TECHNIQUE: Contiguous axial images were obtained from the base of the skull through the vertex without intravenous contrast. RADIATION DOSE REDUCTION: This exam was performed according to the departmental dose-optimization program which includes automated exposure control, adjustment of the mA and/or kV according to patient size and/or use of iterative reconstruction technique. COMPARISON:  Brain MRI from yesterday FINDINGS: Brain: Acute infarct in the left occipital lobe is largely occult. There is an area of possible new cytotoxic edema in the left parietal lobe. Chronic infarcts in the right occipital cortex and bilateral cerebellum. No evidence of new infarct or hemorrhagic conversion. There is chronic small vessel ischemia gliosis which is confluent in the cerebral white matter. No hydrocephalus or masslike finding. Vascular: No hyperdense vessel. Skull: No acute finding Sinuses/Orbits: Sclerosis around the left sphenoid sinus attributed to chronic inflammation. IMPRESSION: 1. Suspect small interval infarct in the left parietal cortex. Known recent infarct in the left occipital white matter. No acute hemorrhage after TNK. 2. Advanced chronic ischemic injury. Electronically Signed   By: Dorn Roulette M.D.   On: 09/08/2023 10:00   ECHOCARDIOGRAM COMPLETE Result Date: 09/07/2023    ECHOCARDIOGRAM REPORT   Patient Name:   ELDOR CONAWAY Date of Exam: 09/07/2023 Medical Rec #:  [id number]      Height:       68.0 in Accession #:    [id number]     Weight:       177.2 lb Date of Birth:  1946-08-14     BSA:          1.941 m Patient Age:    77 years       BP:            162/91 mmHg Patient Gender: M              HR:           77 bpm. Exam Location:  Inpatient Procedure: 2D Echo, Cardiac Doppler, Color Doppler and Intracardiac            Opacification Agent Indications:    Stroke  History:        Patient has  no prior history of Echocardiogram examinations.                 Risk Factors:Hypertension.  Sonographer:    Ellouise Mose RDCS Referring Phys: [id number] [facility] IMPRESSIONS  1. Left ventricular ejection fraction, by estimation, is 25 to 30%. Left ventricular ejection fraction by PLAX is 34 %. The left ventricle has severely decreased function. The left ventricle demonstrates global hypokinesis. There is mild concentric left  ventricular hypertrophy. Left ventricular diastolic parameters are consistent with Grade I diastolic dysfunction (impaired relaxation).  2. Right ventricular systolic function is normal. The right ventricular size is normal. There is normal pulmonary artery systolic pressure.  3. The mitral valve is normal in structure. Trivial mitral valve regurgitation. No evidence of mitral stenosis.  4. The aortic valve is tricuspid. There is mild calcification of the aortic valve. There is mild thickening of the aortic valve. Aortic valve regurgitation is not visualized. No aortic stenosis is present.  5. The inferior vena cava is normal in size with greater than 50% respiratory variability, suggesting right atrial pressure of 3 mmHg. FINDINGS  Left Ventricle: Left ventricular ejection fraction, by estimation, is 25 to 30%. Left ventricular ejection fraction by PLAX is 34 %. The left ventricle has severely decreased function. The left ventricle demonstrates global hypokinesis. Definity  contrast agent was given IV to delineate the left ventricular endocardial borders. The left ventricular internal cavity size was normal in size. There is mild concentric left ventricular hypertrophy. Left ventricular diastolic parameters are consistent with Grade I diastolic  dysfunction (impaired relaxation). Indeterminate filling pressures. Right Ventricle: The right ventricular size is normal. No increase in right ventricular wall thickness. Right ventricular systolic function is normal. There is normal pulmonary artery systolic pressure. The tricuspid regurgitant velocity is 1.77 m/s, and  with an assumed right atrial pressure of 3 mmHg, the estimated right ventricular systolic pressure is 15.5 mmHg. Left Atrium: Left atrial size was normal in size. Right Atrium: Right atrial size was normal in size. Pericardium: There is no evidence of pericardial effusion. Mitral Valve: The mitral valve is normal in structure. Trivial mitral valve regurgitation. No evidence of mitral valve stenosis. Tricuspid Valve: The tricuspid valve is normal in structure. Tricuspid valve regurgitation is trivial. No evidence of tricuspid stenosis. Aortic Valve: The aortic valve is tricuspid. There is mild calcification of the aortic valve. There is mild thickening of the aortic valve. Aortic valve regurgitation is not visualized. No aortic stenosis is present. Pulmonic Valve: The pulmonic valve was normal in structure. Pulmonic valve regurgitation is not visualized. No evidence of pulmonic stenosis. Aorta: The aortic root is normal in size and structure. Venous: The inferior vena cava is normal in size with greater than 50% respiratory variability, suggesting right atrial pressure of 3 mmHg. IAS/Shunts: No atrial level shunt detected by color flow Doppler.  LEFT VENTRICLE PLAX 2D LV EF:         Left            Diastology                ventricular     LV e' medial:    4.46 cm/s                ejection        LV E/e' medial:  9.9                fraction by     LV e' lateral:   5.66 cm/s  PLAX is 34      LV E/e' lateral: 7.8                %. LVIDd:         4.30 cm LVIDs:         3.60 cm LV PW:         1.10 cm LV IVS:        1.20 cm LVOT diam:     2.60 cm LV SV:         74 LV SV Index:   38 LVOT  Area:     5.31 cm  LV Volumes (MOD) LV vol d, MOD    57.7 ml A2C: LV vol d, MOD    84.6 ml A4C: LV vol s, MOD    37.3 ml A2C: LV vol s, MOD    57.3 ml A4C: LV SV MOD A2C:   20.4 ml LV SV MOD A4C:   84.6 ml LV SV MOD BP:    24.3 ml RIGHT VENTRICLE            IVC RV S prime:     5.33 cm/s  IVC diam: 1.90 cm TAPSE (M-mode): 1.3 cm LEFT ATRIUM             Index        RIGHT ATRIUM           Index LA diam:        3.50 cm 1.80 cm/m   RA Area:     11.70 cm LA Vol (A2C):   34.8 ml 17.92 ml/m  RA Volume:   20.70 ml  10.66 ml/m LA Vol (A4C):   39.7 ml 20.45 ml/m LA Biplane Vol: 39.9 ml 20.55 ml/m  AORTIC VALVE LVOT Vmax:   84.40 cm/s LVOT Vmean:  50.800 cm/s LVOT VTI:    0.139 m  AORTA Ao Root diam: 3.00 cm Ao Asc diam:  3.80 cm MITRAL VALVE               TRICUSPID VALVE MV Area (PHT): 4.39 cm    TR Peak grad:   12.5 mmHg MV Decel Time: 173 msec    TR Vmax:        177.00 cm/s MV E velocity: 44.00 cm/s MV A velocity: 92.80 cm/s  SHUNTS MV E/A ratio:  0.47        Systemic VTI:  0.14 m                            Systemic Diam: 2.60 cm Annabella Scarce MD Electronically signed by Annabella Scarce MD Signature Date/Time: 09/07/2023/6:02:19 PM    Final    CT ANGIO HEAD NECK W WO CM (CODE STROKE) Result Date: 09/07/2023 CLINICAL DATA:  Neuro deficit, acute, stroke suspected EXAM: CT ANGIOGRAPHY HEAD AND NECK WITH AND WITHOUT CONTRAST TECHNIQUE: Multidetector CT imaging of the head and neck was performed using the standard protocol during bolus administration of intravenous contrast. Multiplanar CT image reconstructions and MIPs were obtained to evaluate the vascular anatomy. Carotid stenosis measurements (when applicable) are obtained utilizing NASCET criteria, using the distal internal carotid diameter as the denominator. RADIATION DOSE REDUCTION: This exam was performed according to the departmental dose-optimization program which includes automated exposure control, adjustment of the mA and/or kV according to patient  size and/or use of iterative reconstruction technique. CONTRAST:  75mL OMNIPAQUE  IOHEXOL  350 MG/ML SOLN COMPARISON:  Same day CT head and  MRI head. FINDINGS: CTA NECK FINDINGS Aortic arch: Great vessel origins are patent. Aortic atherosclerosis. Right carotid system: No evidence of dissection, stenosis (50% or greater), or occlusion. Left carotid system: No evidence of dissection, stenosis (50% or greater), or occlusion. Vertebral arteries: Left dominant. Suspected moderate stenosis of the right vertebral artery origin, although streak artifact limits assessment. Left vertebral artery is patent without significant stenosis. Skeleton: No acute abnormality on limited assessment. Severe lower cervical degenerative disc disease. Other neck: No acute abnormality on limited assessment. Upper chest: Visualized lung apices are clear. Review of the MIP images confirms the above findings CTA HEAD FINDINGS Anterior circulation: Bilateral intracranial ICAs, MCAs, and ACAs are patent without proximal hemodynamically significant stenosis. Posterior circulation: Bilateral intradural vertebral arteries, basilar artery, and bilateral posterior cerebral arteries are patent without proximal hemodynamically significant stenosis. Right fetal type PCA, anatomic variant. Venous sinuses: As permitted by contrast timing, patent. Anatomic variants: Detailed above. Review of the MIP images confirms the above findings IMPRESSION: 1. No emergent large vessel occlusion. 2. Suspected moderate stenosis of the right vertebral artery origin, although streak artifact limits assessment. 3.  Aortic Atherosclerosis (ICD10-I70.0). Electronically Signed   By: Gilmore GORMAN Molt M.D.   On: 09/07/2023 12:10   MR BRAIN WO CONTRAST Result Date: 09/07/2023 CLINICAL DATA:  Neuro deficit, acute, stroke suspected EXAM: MRI HEAD WITHOUT CONTRAST TECHNIQUE: Multiplanar, multiecho pulse sequences of the brain and surrounding structures were obtained without  intravenous contrast. COMPARISON:  Same day CT head. FINDINGS: Brain: Small acute infarct in the left periventricular occipital lobe. Slight edema without mass effect. Remote right occipital and bilateral cerebellar infarcts. Moderate to advanced patchy T2/FLAIR hyperintensity white matter, nonspecific but compatible with chronic microvascular ischemic disease. No evidence of acute hemorrhage, mass lesion, midline shift or hydrocephalus. Vascular: Please see forthcoming CTA. Skull and upper cervical spine: Normal marrow signal. Sinuses/Orbits: Mild paranasal sinus mucosal thickening. IMPRESSION: 1. Small acute infarct in the left periventricular occipital lobe. 2. Remote right occipital and bilateral cerebellar infarcts and moderate to severe chronic microvascular ischemic disease. Findings discussed with Dr. VANESSA via telephone at 11:50 a.m. Electronically Signed   By: Gilmore GORMAN Molt M.D.   On: 09/07/2023 12:00   CT HEAD CODE STROKE WO CONTRAST Result Date: 09/07/2023 CLINICAL DATA:  Code stroke.  Neuro deficit, acute, stroke suspected EXAM: CT HEAD WITHOUT CONTRAST TECHNIQUE: Contiguous axial images were obtained from the base of the skull through the vertex without intravenous contrast. RADIATION DOSE REDUCTION: This exam was performed according to the departmental dose-optimization program which includes automated exposure control, adjustment of the mA and/or kV according to patient size and/or use of iterative reconstruction technique. COMPARISON:  None Available. FINDINGS: Brain: Remote right PCA territory infarct. Remote right cerebellar infarct. Advanced patchy and confluent white matter hypodensities are nonspecific but compatible with chronic microvascular ischemic disease. No evidence of acute large vascular territory infarct, acute hemorrhage, mass lesion, midline shift or hydrocephalus. Vascular: No hyperdense vessel identified. Calcific atherosclerosis. Skull: No acute fracture.  Sinuses/Orbits: Mild paranasal sinus mucosal thickening. No acute orbital findings. Other: No mastoid effusions. ASPECTS [facility] Stroke Program Early CT Score) Total score (0-10 with 10 being normal): 10. IMPRESSION: 1. No evidence of acute large vascular territory infarct or acute hemorrhage. ASPECTS is 10. 2. Remote right PCA territory and remote right cerebellar infarcts with advanced chronic microvascular ischemic change. An MRI could better assess for acute infarct if clinically warranted. Electronically Signed   By: Gilmore GORMAN Molt M.D.   On: 09/07/2023 11:16  PHYSICAL EXAM  Temp:  [97.9 F (36.6 C)-98.8 F (37.1 C)] 98.3 F (36.8 C) (01/08 1547) Pulse Rate:  [74-90] 74 (01/08 1547) Resp:  [17-30] 18 (01/08 1547) BP: (99-145)/(67-98) 107/78 (01/08 1547) SpO2:  [93 %-98 %] 94 % (01/08 1547) Weight:  [80.4 kg] 80.4 kg (01/08 0800)  General - Well nourished, well developed, in no apparent distress.  Ophthalmologic - fundi not visualized due to noncooperation.  Cardiovascular - Regular rhythm and rate.  Mental Status -  Level of arousal and orientation to time, place, and person were intact. Language including expression, naming, repetition, comprehension was assessed and found intact. Fund of Knowledge was assessed and was intact.  Cranial Nerves II - XII - II - Visual field intact OU, decreased visual acuity on the left upper quadrant visual field. III, IV, VI - Extraocular movements intact. V - Facial sensation intact bilaterally. VII - Facial movement intact bilaterally. VIII - Hearing & vestibular intact bilaterally. X - Palate elevates symmetrically. XI - Chin turning & shoulder shrug intact bilaterally. XII - Tongue protrusion intact.  Motor Strength - The patient's strength was normal in all extremities and pronator drift was absent.  Bulk was normal and fasciculations were absent.   Motor Tone - Muscle tone was assessed at the neck and appendages and was  normal.  Reflexes - The patient's reflexes were symmetrical in all extremities and he had no pathological reflexes.  Sensory - Light touch, temperature/pinprick were assessed and were symmetrical.    Coordination - The patient had normal movements in the hands and feet with no obvious ataxia or dysmetria.  Tremor was absent.  Gait and Station - deferred.   ASSESSMENT/PLAN [name] is a 78 y.o. male with history of hypertension, rheumatoid arthritis, prostate cancer status post TURP and radiotherapy, hyper lipidemia, diabetes admitted for nausea, right side leaning, dizziness and worsening slurred speech. No TNK given due to recent stroke.    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 Newly seen foci of acute cortical infarction in the posterior left parietal lobe. Echo EF 25 to 30% on 09/07/2023 TEE today showed EF 25 to 30% with global hypokinesis, secundum ASD noted with bidirectional flow  LE venous Doppler no DVT Eliquis  for VTE prophylaxis aspirin  81 mg daily and clopidogrel  75 mg daily prior to admission, now on Eliquis  (apixaban ) daily given stroke and cardiomyopathy with low EF.  Repeat echo in 2-3 months, once EF > 35% may consider switch eliquis  to antipletelet if no afib found Patient counseled to be compliant with his antithrombotic medications Ongoing aggressive stroke risk factor management Therapy recommendations: None Disposition: Pending  History of stroke MRI showed old right occipital and bilateral cerebellar infarcts on MRI in the past 08/2023 admitted for right-sided weakness, aphasia and slurred speech.  CT no acute abnormality.  Status post TNK.  CT head and neck showed right VA moderate stenosis.  MRI showed left occipital white matter small infarct.  EF 25 to 30%.   LDL 105, A1c 6.4.  Discharged on DAPT and Crestor  20.  Recommend outpatient cardiology follow-up with Dr. Ladona.  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  Diabetes HgbA1c 6.4 goal < 7.0, Controlled CBG monitoring SSI DM education and close PCP follow up  Hypertension Stable Avoid low BP Long term BP goal normotensive  Hyperlipidemia Home meds: Crestor  20 LDL 105 in 08/2023, goal < 70 Now on Crestor  20 Continue statin at discharge  Other Stroke Risk Factors Advanced age  Other Active Problems Prostate cancer status post TURP and radiotherapy Rheumatoid arthritis  Hospital day # 1  I discussed with Dr. Ladona and primary team. I spent extensive face-to-face time with the patient, more than 50% of which was spent in counseling and coordination of care, reviewing test results, images and medication, and discussing the diagnosis, treatment plan and potential prognosis. This patient's care requiresreview of multiple databases, neurological assessment, discussion with patient and wife, other specialists and medical decision making of high complexity.    Ary Cummins, MD PhD Stroke Neurology 09/16/2023 6:06 PM    To contact Stroke Continuity provider, please refer to [email or web address]. After hours, contact General Neurology

## 2023-09-16 NOTE — Progress Notes (Signed)
 PT Cancellation Note  Patient Details Name: Charles Frost MRN: [id number] DOB: 09/25/1945   Cancelled Treatment:    Reason Eval/Treat Not Completed: (P) Patient at procedure or test/unavailable Pt is off the floor for TEE. PT will follow back this afternoon for Eval.  [name] Lapidus PT, DPT Acute Rehabilitation Services Please use secure chat or  Call Office [phone]    [name] Fleeta [facility] 09/16/2023, 10:36 AM

## 2023-09-16 NOTE — Evaluation (Signed)
 Occupational Therapy Evaluation Patient Details Name: Charles Frost MRN: [id number] DOB: 1946-05-22 Today's Date: 09/16/2023   History of Present Illness Patient is a 78 yo male presenting to the ED with dysarthria, dizziness, and R sided weakness on 09/15/23.  MRI showing new small acute infarcts in L occipital region and cortical infarction in the posterior left parietal lobe. Previous occipital infarct did not progress. TEE pending. 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   Clinical Impression   Prior to this admission, patient was attending outpatient therapy services after CVA on 09/07/23, not driving, and independent in his ADL management. Patient's wife was double checking his medications. Currently, patient presenting with minimal L sinus pressure, decreased attention, and need for minimal assist in order to complete ADL activities. Visual assessment not completed this date as patient was taken early for TEE, OT will contiue to assess. Per wife, patient is at or near his baseline, and would like to continue with outpatient therapy services at discharge. OT recommending outpatient OT (with neuro focus) at [facility]. OT will continue to follow acutely.      If plan is discharge home, recommend the following: A little help with walking and/or transfers;A little help with bathing/dressing/bathroom;Help with stairs or ramp for entrance;Assist for transportation    Functional Status Assessment  Patient has had a recent decline in their functional status and demonstrates the ability to make significant improvements in function in a reasonable and predictable amount of time.  Equipment Recommendations  None recommended by OT    Recommendations for Other Services       Precautions / Restrictions Precautions Precautions: Fall Precaution Comments: L visual field deficit bilat eyes Restrictions Weight Bearing Restrictions Per Provider Order: No      Mobility  Bed Mobility Overal bed mobility: Needs Assistance Bed Mobility: Supine to Sit, Sit to Supine     Supine to sit: Supervision, HOB elevated, Used rails Sit to supine: Supervision        Transfers Overall transfer level: Needs assistance                 General transfer comment: did not assess due to patient needing to go for TEE      Balance Overall balance assessment: Mild deficits observed, not formally tested                                         ADL either performed or assessed with clinical judgement   ADL Overall ADL's : Needs assistance/impaired Eating/Feeding: NPO   Grooming: Set up;Sitting   Upper Body Bathing: Set up;Sitting   Lower Body Bathing: Minimal assistance;Sit to/from stand;Sitting/lateral leans   Upper Body Dressing : Set up;Sitting   Lower Body Dressing: Minimal assistance;Sitting/lateral leans;Sit to/from stand   Toilet Transfer: Minimal assistance;Ambulation Toilet Transfer Details (indicate cue type and reason): did not formally assess as patient was taken to TEE, however has been going ambulating to the bathroom with staff Toileting- Clothing Manipulation and Hygiene: Contact guard assist;Sit to/from stand;Sitting/lateral lean       Functional mobility during ADLs: Minimal assistance;Cueing for sequencing;Cueing for safety General ADL Comments: Patient presenting with minimal L sinus pressure, decreased attention, and need for minimal assist in order to complete ADL activities. Visual assessment not completed this date as patient was taken early for TEE, OT will contiue to assess. Per wife, patient is at  or near his baseline, and would like to continue with outpatient therapy services at discharge. OT recommending outpatient OT (with neuro focus) at [facility]. OT will continue to follow acutely.     Vision Baseline Vision/History: 1 Wears glasses Ability to See in Adequate Light: 0 Adequate Patient Visual Report:  Peripheral vision impairment Vision Assessment?: Yes;Vision impaired- to be further tested in functional context Additional Comments: patient needing to go to TEE, will continue to assess vision     Perception Perception: Impaired Preception Impairment Details: Figure ground     Praxis Praxis: Impaired Praxis Impairment Details: Organization     Pertinent Vitals/Pain Pain Assessment Pain Assessment: Faces Pain Score: 0-No pain Pain Intervention(s): Monitored during session, Limited activity within patient's tolerance, Repositioned     Extremity/Trunk Assessment Upper Extremity Assessment Upper Extremity Assessment: Overall WFL for tasks assessed;Right hand dominant   Lower Extremity Assessment Lower Extremity Assessment: Defer to PT evaluation   Cervical / Trunk Assessment Cervical / Trunk Assessment: Normal   Communication Communication Communication: No apparent difficulties Cueing Techniques: Verbal cues   Cognition Arousal: Alert Behavior During Therapy: WFL for tasks assessed/performed Overall Cognitive Status: Impaired/Different from baseline Area of Impairment: Memory, Following commands, Safety/judgement, Attention                   Current Attention Level: Sustained Memory: Decreased short-term memory Following Commands: Follows one step commands with increased time Safety/Judgement: Decreased awareness of deficits     General Comments: STM deficits noted, busy environment therefore will continue to assess, wife states he is close to his baseline     General Comments       Exercises     Shoulder Instructions      Home Living Family/patient expects to be discharged to:: Private residence Living Arrangements: Spouse/significant other Available Help at Discharge: Family Type of Home: House Home Access: Stairs to enter Secretary/administrator of Steps: 2 Entrance Stairs-Rails: Left;Right Home Layout: One level     Bathroom Shower/Tub:  Producer, Television/film/video: Standard     Home Equipment: Cane - single point          Prior Functioning/Environment Prior Level of Function : Independent/Modified Independent             Mobility Comments: Uses cane PRN ADLs Comments: independent, not currently driving since previous stroke, wife double checks medications        OT Problem List: Decreased cognition;Impaired vision/perception;Impaired balance (sitting and/or standing)      OT Treatment/Interventions: Self-care/ADL training;Therapeutic activities;Therapeutic exercise;Cognitive remediation/compensation;Neuromuscular education;Visual/perceptual remediation/compensation;Patient/family education;Energy conservation;DME and/or AE instruction;Balance training;Manual therapy    OT Goals(Current goals can be found in the care plan section) Acute Rehab OT Goals Patient Stated Goal: to get better OT Goal Formulation: With patient Time For Goal Achievement: 09/30/23 Potential to Achieve Goals: Good  OT Frequency: Min 1X/week    Co-evaluation              AM-PAC OT 6 Clicks Daily Activity     Outcome Measure Help from another person eating meals?: Total (NPO) Help from another person taking care of personal grooming?: A Little Help from another person toileting, which includes using toliet, bedpan, or urinal?: A Little Help from another person bathing (including washing, rinsing, drying)?: A Little Help from another person to put on and taking off regular upper body clothing?: A Little Help from another person to put on and taking off regular lower body clothing?: A Little 6 Click Score: 16  End of Session Nurse Communication: Mobility status  Activity Tolerance: Patient tolerated treatment well Patient left: in bed;with nursing/sitter in room;with family/visitor present  OT Visit Diagnosis: Unsteadiness on feet (R26.81);Muscle weakness (generalized) (M62.81);Low vision, both eyes (H54.2)                 Time: 8992-8978 OT Time Calculation (min): 14 min Charges:  OT General Charges $OT Visit: 1 Visit OT Evaluation $OT Eval Moderate Complexity: 1 Mod  Ronal Gift E. Juandedios Dudash, OTR/L Acute Rehabilitation Services [phone]   Ronal Gift Salt 09/16/2023, 10:52 AM

## 2023-09-16 NOTE — CV Procedure (Signed)
    TRANSESOPHAGEAL ECHOCARDIOGRAM   NAME:  Charles Frost   MRN: [id number] DOB:  [birth year]   ADMIT DATE: 09/15/2023  INDICATIONS: Embolic CVA  PROCEDURE:   Informed consent was obtained prior to the procedure. The risks, benefits and alternatives for the procedure were discussed and the patient comprehended these risks.  Risks include, but are not limited to, cough, sore throat, vomiting, nausea, somnolence, esophageal and stomach trauma or perforation, bleeding, low blood pressure, aspiration, pneumonia, infection, trauma to the teeth and death.    Procedural time out performed. The oropharynx was anesthetized with topical 2% lidocaine     Patient received monitored anesthesia care under the supervision of Dr. Epifanio. Patient received a total of 248.64 mg propofol  during the procedure.  The transesophageal probe was inserted in the esophagus and stomach without difficulty and multiple views were obtained.    COMPLICATIONS:    There were no immediate complications.  FINDINGS:  LEFT VENTRICLE: EF = 25-30% with global hypokinesis  RIGHT VENTRICLE: Normal size and function.   LEFT ATRIUM: No thrombus/mass. Mildly dilated  LEFT ATRIAL APPENDAGE: No thrombus/mass.   RIGHT ATRIUM: No thrombus/mass. Normal size.  AORTIC VALVE:  Trileaflet. No regurgitation. No vegetation.  MITRAL VALVE:    Normal structure. Trivial regurgitation. No vegetation.  TRICUSPID VALVE: Normal structure. Mild regurgitation. No vegetation.  PULMONIC VALVE: Grossly normal structure. No regurgitation. No apparent vegetation.  INTERATRIAL SEPTUM: Secundum ASD with prominent left to right flow by color doppler and rapidly positive right to left flow by agitated saline contrast. Consistent with secundum ASD.  PERICARDIUM: No effusion noted.  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

## 2023-09-16 NOTE — Assessment & Plan Note (Addendum)
 Suspected potential cardiac emboli source. Neurology consulted and recommended TEE and consider anticoagulation.  - Telemetry - c/s neuro stroke team, appreciate recs - TEE to be discussed with cardiology per neuro note  - ASA 81, Plavix  75 daily  - Neuro checks q2 - AM BMP, CBC, mag

## 2023-09-16 NOTE — Evaluation (Addendum)
 Physical Therapy Brief Evaluation and Discharge Note Patient Details Name: Charles Frost MRN: [id number] DOB: 1945-12-19 Today's Date: 09/16/2023   History of Present Illness  Patient is a 78 yo male presenting to the ED with dysarthria, dizziness, and R sided weakness on 09/15/23.  MRI showing new small acute infarcts in L occipital region and cortical infarction in the posterior left parietal lobe. Previous occipital infarct did not progress. TEE pending. 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  Clinical Impression  Pt reports that he had returned to his baseline level of mobility since his last CVA in December. He is able to perform bed mobility, and transfers independently and benefits from [facility] for stability with ambulation but does not require any outside assist. Pt's wife double checks his medication and is with him most of the time. Pt does not need any further PT at this time.   Please reorder PT if pt has a change in status.      PT Assessment Patient does not need any further PT services  Assistance Needed at Discharge  Intermittent Supervision/Assistance    Equipment Recommendations None recommended by PT     Precautions/Restrictions Precautions Precautions: Fall Precaution Comments: L visual field deficit bilat eyes Restrictions Weight Bearing Restrictions Per Provider Order: No        Mobility  Bed Mobility Rolling: Independent Supine/Sidelying to sit: Independent Sit to supine/sidelying: Independent General bed mobility comments: able to roll on side and push up to seated from flattened bed  Transfers   Equipment used: None Transfers: Sit to/from Stand Sit to Stand: Independent           General transfer comment: independent with coming to standing and self steadying without AD    Ambulation/Gait Ambulation/Gait assistance: Modified independent (Device/Increase time) Gait Distance (Feet): 150 Feet (+150) Assistive device:  Rolling walker (2 wheels), Straight cane Gait Pattern/deviations: Drifts right/left, Step-through pattern Gait Speed: Pace WFL General Gait Details: initiated gait with RW where he was able to ambulate without assist, trialed cane and pt with mild drifitng R and L but no overt LoB  Home Activity Instructions Home Activity Instructions: continue use of SPC for safety reports he was going to go to OP PT but did not make it due to return to hospital, pt reports he feels his balance is good enough that he no longer needs OP PT.  Stairs Stairs: Yes Stairs assistance: Supervision Stair Management: Two rails, Alternating pattern, Forwards Number of Stairs: 2 General stair comments: strong steady ascent/descent  Modified Rankin (Stroke Patients Only) Modified Rankin (Stroke Patients Only) Pre-Morbid Rankin Score: Moderate disability Modified Rankin: Moderately severe disability      Balance Overall balance assessment: Mild deficits observed, not formally tested Sitting-balance support: Feet supported, No upper extremity supported Sitting balance-Leahy Scale: Good     Standing balance support: Reliant on assistive device for balance, Single extremity supported Standing balance-Leahy Scale: Fair            Pertinent Vitals/Pain PT - Brief Vital Signs All Vital Signs Stable: Yes Pain Assessment Pain Assessment: No/denies pain Pain Intervention(s): Monitored during session     Home Living Family/patient expects to be discharged to:: Private residence Living Arrangements: Spouse/significant other Available Help at Discharge: Family;Available 24 hours/day Home Environment: Stairs to enter  [employer] of Steps: 2 Home Equipment: Cane - single point        Prior Function Level of Independence: Independent with assistive device(s) Comments: ambulates with cane PRN  UE/LE Assessment   UE ROM/Strength/Tone/Coordination: WFL    LE ROM/Strength/Tone/Coordination: [facility]       Communication   Communication Communication: No apparent difficulties Cueing Techniques: Verbal cues     Cognition Overall Cognitive Status: Appears within functional limits for tasks assessed/performed       General Comments General comments (skin integrity, edema, etc.): continues to have visual field deficit, but employs good scanning to compensate        Assessment/Plan    PT Problem List Decreased balance;Decreased mobility       PT Visit Diagnosis Other abnormalities of gait and mobility (R26.89);Difficulty in walking, not elsewhere classified (R26.2);Other symptoms and signs involving the nervous system (R29.898)    No Skilled PT Patient is modified independent with all activity/mobility;Patient will have necessary level of assist by caregiver at discharge;All education completed    AMPAC 6 Clicks Help needed turning from your back to your side while in a flat bed without using bedrails?: None Help needed moving from lying on your back to sitting on the side of a flat bed without using bedrails?: None Help needed moving to and from a bed to a chair (including a wheelchair)?: None Help needed standing up from a chair using your arms (e.g., wheelchair or bedside chair)?: None Help needed to walk in hospital room?: None Help needed climbing 3-5 steps with a railing? : None 6 Click Score: 24      End of Session Equipment Utilized During Treatment: Gait belt Activity Tolerance: Patient tolerated treatment well Patient left: Other (comment);with family/visitor present (sitting EoB) Nurse Communication: Mobility status PT Visit Diagnosis: Other abnormalities of gait and mobility (R26.89);Difficulty in walking, not elsewhere classified (R26.2);Other symptoms and signs involving the nervous system (M70.101)     Time: 8381-8367 PT Time Calculation (min) (ACUTE ONLY): 14 min  Charges:   PT Evaluation $PT Eval Low Complexity: 1 Low      Sher Shampine B. Fleeta Lapidus  PT, DPT Acute Rehabilitation Services Please use secure chat or  Call Office [phone]   [name] Fleeta [facility]  09/16/2023, 5:36 PM

## 2023-09-16 NOTE — Progress Notes (Signed)
 PHARMACY - ANTICOAGULATION CONSULT NOTE  Pharmacy Consult for Eliquis  Indication: cardiomyopathy with low EF and stroke  No Known Allergies  Patient Measurements: Height: 5' 8 (172.7 cm) Weight: 80.4 kg (177 lb 4 oz) IBW/kg (Calculated) : 68.4  Vital Signs: Temp: 98.3 F (36.8 C) (01/08 1547) Temp Source: Oral (01/08 1547) BP: 107/78 (01/08 1547) Pulse Rate: 74 (01/08 1547)  Labs: Recent Labs    09/15/23 0825 09/15/23 0828 09/15/23 0828 09/15/23 0833 09/15/23 1133 09/16/23 0541  HGB  --  14.0   < > 14.6  --  12.9*  HCT  --  45.6  --  43.0  --  41.2  PLT  --  217  --   --   --  216  APTT  --  25  --   --   --   --   LABPROT  --  13.4  --   --   --   --   INR  --  1.0  --   --   --   --   CREATININE  --  1.34*  --  1.30*  --  1.25*  TROPONINIHS 11  --   --   --  16  --    < > = values in this interval not displayed.    Estimated Creatinine Clearance: 49.4 mL/min (A) (by C-G formula based on SCr of 1.25 mg/dL (H)).   Medical History: Past Medical History:  Diagnosis Date   GERD (gastroesophageal reflux disease)    Hypercholesterolemia    Hypertension    Prostate cancer (HCC)    Rheumatoid arthritis (HCC)    RA    Medications:  Scheduled:   apixaban   5 mg Oral BID   gabapentin   300 mg Oral QHS   lidocaine        metoprolol  succinate  25 mg Oral Daily   rosuvastatin   20 mg Oral Daily   sacubitril -valsartan   1 tablet Oral BID   tamsulosin   0.8 mg Oral Daily   Infusions:  PRN: acetaminophen  **OR** acetaminophen , lidocaine   Assessment: 78 yo male admitted with stroke, suspected cardiac emboli source. Neurology recommends anticoagulation with Eliquis .   Goal of Therapy:  Therapeutic anticoagulation   Plan:  Eliquis  5mg  PO BID Will provide education and copay check prior to discharge  Rocky Slade, PharmD, BCPS 09/16/2023,6:14 PM  Please check AMION for all [facility] Pharmacy phone numbers After 10:00 PM, call Main Pharmacy [phone]

## 2023-09-16 NOTE — Plan of Care (Signed)
  Problem: Activity: Goal: Risk for activity intolerance will decrease Outcome: Progressing   Problem: Nutrition: Goal: Adequate nutrition will be maintained Outcome: Progressing   Problem: Pain Management: Goal: General experience of comfort will improve Outcome: Progressing   Problem: Safety: Goal: Ability to remain free from injury will improve Outcome: Progressing   Problem: Skin Integrity: Goal: Risk for impaired skin integrity will decrease Outcome: Progressing   Problem: Elimination: Goal: Will not experience complications related to bowel motility Outcome: Progressing   Problem: Education: Goal: Knowledge of General Education information will improve Description: Including pain rating scale, medication(s)/side effects and non-pharmacologic comfort measures Outcome: Progressing

## 2023-09-16 NOTE — TOC Initial Note (Signed)
 Transition of Care [facility]) - Initial/Assessment Note    Patient Details  Name: Charles Frost MRN: [id number] Date of Birth: 1946/03/17  Transition of [facility]) CM/SW Contact:    Andrez JULIANNA George, RN Phone Number: 09/16/2023, 3:35 PM  Clinical Narrative:                  Pt is from home with his spouse. She is with him most of the time. Pt manges his own medications and denies any issues.  Wife is able to provide needed transportation. Outpatient therapy referral sent to [facility] per pt preference. Information on the AVS. Pt will call to schedule the first appointment. TOC following.  Expected Discharge Plan: OP Rehab Barriers to Discharge: Continued Medical Work up   Patient Goals and CMS Choice     Choice offered to / list presented to : Patient      Expected Discharge Plan and Services   Discharge Planning Services: CM Consult                                          Prior Living Arrangements/Services   Lives with:: Spouse Patient language and need for interpreter reviewed:: Yes Do you feel safe going back to the place where you live?: Yes        Care giver support system in place?: Yes (comment) Current home services: DME (shower seat/ cane/ walker) Criminal Activity/Legal Involvement Pertinent to Current Situation/Hospitalization: No - Comment as needed  Activities of Daily Living   ADL Screening (condition at time of admission) Independently performs ADLs?: No Does the patient have a NEW difficulty with bathing/dressing/toileting/self-feeding that is expected to last >3 days?: Yes (Initiates electronic notice to provider for possible OT consult) (stroke workup) Does the patient have a NEW difficulty with getting in/out of bed, walking, or climbing stairs that is expected to last >3 days?: Yes (Initiates electronic notice to provider for possible PT consult) (stroke workup) Does the patient have a NEW difficulty with communication that is  expected to last >3 days?: No Is the patient deaf or have difficulty hearing?: No Does the patient have difficulty seeing, even when wearing glasses/contacts?: Yes Does the patient have difficulty concentrating, remembering, or making decisions?: No  Permission Sought/Granted                  Emotional Assessment Appearance:: Appears stated age Attitude/Demeanor/Rapport: Engaged Affect (typically observed): Accepting Orientation: : Oriented to Self, Oriented to Place, Oriented to  Time, Oriented to Situation   Psych Involvement: No (comment)  Admission diagnosis:  Stroke [facility]) [I63.9] Cerebrovascular accident (CVA), unspecified mechanism (HCC) [I63.9] Patient Active Problem List   Diagnosis Date Noted   Stroke (HCC) 09/15/2023   HFrEF (heart failure with reduced ejection fraction) (HCC) 09/15/2023   Acute ischemic left PCA stroke (HCC) 09/07/2023   UTI (urinary tract infection) 12/08/2016   Bladder pain 12/07/2016   Anemia 12/07/2016   Hyperglycemia 12/07/2016   Hypertension 12/07/2016   Malignant neoplasm of prostate (HCC) 08/28/2016   PCP:  Orlando Dwayne NOVAK, FNP Pharmacy:   [facility] [phone] - [place], [place] - [address] AT [facility] [address] [place] [address] Phone: [phone] Fax: [phone]     Social Drivers of Health (SDOH) Social History: SDOH Screenings   Food Insecurity: No Food Insecurity (09/15/2023)  Housing: Low Risk  (09/15/2023)  Transportation  Needs: No Transportation Needs (09/15/2023)  Utilities: Not At Risk (09/15/2023)  Social Connections: Socially Integrated (09/15/2023)  Tobacco Use: Medium Risk (09/15/2023)   SDOH Interventions:     Readmission Risk Interventions     No data to display

## 2023-09-16 NOTE — Progress Notes (Signed)
 Daily Progress Note Intern Pager: [phone]  Patient name: Charles Frost Medical record number: [id number] Date of birth: 04-14-1946 Age: 78 y.o. Gender: male  Primary Care Provider: Orlando Dwayne NOVAK, FNP Consultants: neurology Code Status: Full  Pt Overview and Major Events to Date:  1/7 - admitted  Assessment and Plan:  78yo male with PMHx HTN, HLD, RA, prediabetes, prostate cancer, GERD, HFrEF presented with near-syncopal event. Admitted to neurology service 09/07/23-09/09/23 for small left occipital lobe stroke with TNK administration, discharged with DAPT. New findings on MRI concerning for emboli. Pending TEE today.  Assessment & Plan Stroke [facility]) Suspected potential cardiac emboli source. Neurology consulted and recommended TEE and consider anticoagulation.  - Telemetry - c/s neuro stroke team, appreciate recs - TEE to be discussed with cardiology per neuro note  - ASA 81, Plavix  75 daily  - Neuro checks q2 - AM BMP, CBC, mag HFrEF (heart failure with reduced ejection fraction) (HCC) Last Echo in 09/07/23 with EF 25-30%. Home meds include Entresto , metoprolol . Patient appears euvolemic at this time.  - cont home metoprolol  succinate 25 daily - strict I/o - daily weight    Chronic and Stable Problems:  HTN - hold entresto  for now  HLD -  cont home Crestor  20 daily  Rheumatoid Arthritis - cont home gabapentin  300 nightly  Prediabetes - monitor H/o Prostate Cancer - cont home Flomax  (history of cancer//hypercoagulable state?) GERD  FEN/GI: regular PPx: DAPT currently Dispo:Home pending clinical improvement   Subjective:  Symptoms have resolved and pt states he is back to baseline, wife states he is no longer confused or symptomatic  Objective: Temp:  [97.6 F (36.4 C)-98.8 F (37.1 C)] 98 F (36.7 C) (01/08 0342) Pulse Rate:  [67-91] 76 (01/08 0342) Resp:  [13-26] 18 (01/08 0342) BP: (109-152)/(67-111) 145/89 (01/08 0342) SpO2:  [92 %-99 %] 96 %  (01/08 0342) Physical Exam: General: well appearing, NAD Cardiovascular: RRR, no m/r/g Respiratory: normal work of breathing on RA, CTAB Abdomen: Normal bowel sounds, soft, non-tender Extremities: No swelling BLE  Laboratory: Most recent CBC Lab Results  Component Value Date   WBC 6.2 09/16/2023   HGB 12.9 (L) 09/16/2023   HCT 41.2 09/16/2023   MCV 85.8 09/16/2023   PLT 216 09/16/2023   Most recent BMP    Latest Ref Rng & Units 09/16/2023    5:41 AM  BMP  Glucose 70 - 99 mg/dL 892   BUN 8 - 23 mg/dL 25   Creatinine 9.38 - 1.24 mg/dL 8.74   Sodium 864 - 854 mmol/L 141   Potassium 3.5 - 5.1 mmol/L 4.4   Chloride 98 - 111 mmol/L 105   CO2 22 - 32 mmol/L 24   Calcium  8.9 - 10.3 mg/dL 9.2     IMPRESSION: Non-contrast head CT:   1. 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 2. No acute intracranial hemorrhage or interval acute demarcated cortical infarct. 3. Background advanced chronic small vessel ischemic disease. 4. Left sphenoid sinusitis.   CTA neck:   1. The common carotid, internal carotid and vertebral arteries are patent within the neck without stenosis. Minimal atherosclerotic plaque within the proximal right ICA. Non-stenotic atherosclerotic plaque at the right vertebral artery origin. 2. Aortic Atherosclerosis (ICD10-I70.0).   CTA head:   1. No emergent large vessel occlusion identified. 2. Non-stenotic atherosclerotic plaque within the intracranial internal carotid arteries.   CT perfusion head:   The perfusion software identifies no core infarct. The  perfusion software identifies no critically hypoperfused parenchyma (utilizing the Tmax>6 seconds threshold). No mismatch volume reported.  CT A/P IMPRESSION: 1. Examination is degraded by motion. 2. No acute inflammatory process identified within the abdomen or pelvis. 3. There are 2, slowly growing subcapsular hypoattenuating structures  in the right hepatic lobe, segments 5/6, not well evaluated on this exam. Consider nonemergent contrast-enhanced MRI abdomen as per liver mass protocol for better characterization. 4. Multiple other nonacute observations, as described above.  CXR  IMPRESSION: No active disease.  Sally Reimers, Elyce, DO 09/16/2023, 7:08 AM  PGY-1, [facility] FPTS Intern pager: [phone], text pages welcome Secure chat group [facility] Teaching Service

## 2023-09-16 NOTE — Anesthesia Postprocedure Evaluation (Signed)
 Anesthesia Post Note  Patient: Charles Frost  Procedure(s) Performed: TRANSESOPHAGEAL ECHOCARDIOGRAM     Patient location during evaluation: Cath Lab Anesthesia Type: MAC Level of consciousness: awake and alert Pain management: pain level controlled Vital Signs Assessment: post-procedure vital signs reviewed and stable Respiratory status: spontaneous breathing, nonlabored ventilation and respiratory function stable Cardiovascular status: stable and blood pressure returned to baseline Postop Assessment: no apparent nausea or vomiting Anesthetic complications: no  No notable events documented.  Last Vitals:  Vitals:   09/16/23 1245 09/16/23 1318  BP: 120/77 130/73  Pulse: 81 79  Resp:  18  Temp:  36.8 C  SpO2: 93% 95%    Last Pain:  Vitals:   09/16/23 1318  TempSrc: Oral  PainSc:                  Charles Frost,W. EDMOND

## 2023-09-16 NOTE — Consult Note (Signed)
 Cardiology Consultation   Patient ID: Charles Frost MRN: [id number]; DOB: 1946-04-12  Admit date: 09/15/2023 Date of Consult: 09/16/2023  PCP:  Charles Dwayne NOVAK, FNP   [facility] Providers Cardiologist:  None   {    Patient Profile:   Charles Frost is a 78 y.o. male with a hx of recent CVA (09/07/23), HTN, HLD, HFrEF, GERD, prostate cancer who presented to the ED on 09/15/2023 with a second stroke is being seen 09/16/2023 for the evaluation of HFrEF at the request of Dr. Jeanelle.  History of Present Illness:   Charles Frost is a 78yo male who originally presented to [facility] ED on 09/07/2023 with acute onset of right-sided weakness, slurred speech and right inferior visual field deficit with hypertensive BP readings. Code stroke was called and stat head CT was ordered. Head CT was negative for any acute findings. Patient then underwent brain MRI that revealed a tiny left occipital lobe stroke. TNKase  was administered. While admitted patient underwent an echocardiogram that revealed LVEF 25-30%, global hypokinesis, mild concentric LVH, grade I diastolic dysfunction, mild thickening of aortic valve. At this time patient was started on Toprol  25 mg daily, Entresto  24-26 mg BID with plans for patient to follow up outpatient and start on SGLT2i and spironolactone. Follow up was set to be with Dr. Ladona on 09/28/2023.  Patient then presented again to [facility] ED on 09/15/2023 complaining of waking up with abdominal pain and then a presumed syncopal episode. Patient's wife found him on the ground in the kitchen, pale and sweaty. They reported he was having slurred speech and generalized weakness. Another code stroke was called on this patient at this time. CT head and CTA head/neck were performed and no acute intracranial hemorrhage or interval acute demarcated cortical infarct, minimal atherosclerotic plaque within the proximal right ICA. Non-stenotic atherosclerotic plaque at the right  vertebral artery origin. Brain MRI was ordered and revealed: new cluster of small acute infarctions affecting cortical brain in left occipital region, newly seen foci of acute cortical infarction in posterior left parietal lobe, consistent with recurrent embolic infarctions.   Relevant patient labs include: negative troponin x 2, CBC within normal limits except hemoglobin slightly decreased at 12.9, creatinine slightly above baseline but trending down at 1.25, BUN also slightly elevated but trending down with most recent value 25.  Due to increased suspicion of cardiac emboli source neurology ordered a TEE. Patient was consent for TEE and underwent the procedure morning of 09/16/2023.  TEE performed revealed the following: LVEF 25-30%, normal RV size and function, mildly dilated left atrium, no LA/LAA thrombus, normal right atrium size, trivial MR, mild TR, mild (Grade II) plaque involving ascending aorta, moderately sized secundum atrial septal defect with bidirectional shunting across the atrial septum.  It was concluded that the ASD with bidirectional flow was likely the cause of his stroke.  Past Medical History:  Diagnosis Date   GERD (gastroesophageal reflux disease)    Hypercholesterolemia    Hypertension    Prostate cancer (HCC)    Rheumatoid arthritis (HCC)    RA   Past Surgical History:  Procedure Laterality Date   cysto uretero remove stone  2009   cystoscopy insert stent  2009   PROSTATE BIOPSY     ulnar nerve release Right 2008    Home Medications:  Prior to Admission medications   Medication Sig Start Date End Date Taking? Authorizing Provider  acetaminophen  (TYLENOL ) 500 MG tablet Take 1,000 mg by mouth 2 (  two) times daily as needed for moderate pain (pain score 4-6), fever or headache.   Yes [provider]  adalimumab (HUMIRA, 2 PEN,) 40 MG/0.4ML pen Inject 40 mg into the skin See admin instructions. Inject 40mg  subcutaneously every other Monday.   Yes [provider]  aspirin  EC 81 MG tablet Take 1 tablet (81 mg total) by mouth daily. Swallow whole. 09/10/23  Yes Toberman, Stevi W, NP  clopidogrel  (PLAVIX ) 75 MG tablet Take 1 tablet (75 mg total) by mouth daily. 09/10/23  Yes Toberman, Stevi W, NP  diclofenac Sodium (VOLTAREN) 1 % GEL Apply 2 g topically 2 (two) times daily as needed (pain).   Yes [provider]  gabapentin  (NEURONTIN ) 300 MG capsule Take 300 mg by mouth at bedtime.   Yes [provider]  metoprolol  succinate (TOPROL -XL) 25 MG 24 hr tablet Take 1 tablet (25 mg total) by mouth daily. 09/09/23  Yes Toberman, Stevi W, NP  rosuvastatin  (CRESTOR ) 20 MG tablet Take 1 tablet (20 mg total) by mouth daily. 09/10/23  Yes Toberman, Stevi W, NP  sacubitril -valsartan  (ENTRESTO ) 24-26 MG Take 1 tablet by mouth 2 (two) times daily. Patient taking differently: Take 1 tablet by mouth daily. 09/09/23  Yes Toberman, Stevi W, NP  tamsulosin  (FLOMAX ) 0.4 MG CAPS capsule Take 2 capsules (0.8 mg total) by mouth daily after supper. Patient taking differently: Take 0.8 mg by mouth daily. 12/09/16  Yes Samtani, Jai-Gurmukh, MD   Inpatient Medications: Scheduled Meds:  aspirin  EC  81 mg Oral Daily   clopidogrel   75 mg Oral Daily   gabapentin   300 mg Oral QHS   lidocaine        metoprolol  succinate  25 mg Oral Daily   rosuvastatin   20 mg Oral Daily   tamsulosin   0.8 mg Oral Daily   Continuous Infusions:  PRN Meds: acetaminophen  **OR** acetaminophen , lidocaine   Allergies:   No Known Allergies  Social History   Tobacco Use   Smoking status: Former    Current packs/day: 0.00    Average packs/day: 0.3 packs/day for 30.0 years (7.5 ttl pk-yrs)    Types: Cigarettes    Start date: 05/09/1970    Quit date: 05/09/2000    Years since quitting: 23.3   Smokeless tobacco: Never  Substance Use Topics   Alcohol  use: No  Marital status: Single  Review of Systems  Respiratory:  Negative for shortness of breath.   Cardiovascular:  Negative for  chest pain, orthopnea, claudication and leg swelling.   Physical Exam/Data:   Vitals:   09/16/23 1229 09/16/23 1245 09/16/23 1318 09/16/23 1547  BP: 99/84 120/77 130/73 107/78  Pulse: 90 81 79 74  Resp: (!) 30  18 18   Temp: 98.7 F (37.1 C)  98.2 F (36.8 C) 98.3 F (36.8 C)  TempSrc: Temporal  Oral Oral  SpO2: 93% 93% 95% 94%  Weight:      Height:        Intake/Output Summary (Last 24 hours) at 09/16/2023 1738 Last data filed at 09/16/2023 1225 Gross per 24 hour  Intake 110 ml  Output 200 ml  Net -90 ml      09/16/2023    8:00 AM 09/07/2023   12:48 PM 09/07/2023   11:00 AM  Last 3 Weights  Weight (lbs) 177 lb 4 oz 177 lb 4 oz 177 lb 4 oz  Weight (kg) 80.4 kg 80.4 kg 80.4 kg     Body mass index is 26.95 kg/m.   Physical Exam Neck:  Vascular: No carotid bruit or JVD.  Cardiovascular:     Rate and Rhythm: Normal rate and regular rhythm.     Pulses: Intact distal pulses.     Heart sounds: Normal heart sounds. No murmur heard.    No gallop.  Pulmonary:     Effort: Pulmonary effort is normal.     Breath sounds: Normal breath sounds.  Abdominal:     General: Bowel sounds are normal.     Palpations: Abdomen is soft.  Musculoskeletal:     Right lower leg: No edema.     Left lower leg: No edema.   Telemetry:  Telemetry was personally reviewed and demonstrates:  sinus rhythm, HR 80  Relevant CV Studies: Echocardiogram (09/07/2023) IMPRESSIONS   1. Left ventricular ejection fraction, by estimation, is 25 to 30%. Left  ventricular ejection fraction by PLAX is 34 %. The left ventricle has  severely decreased function. The left ventricle demonstrates global  hypokinesis. There is mild concentric left   ventricular hypertrophy. Left ventricular diastolic parameters are  consistent with Grade I diastolic dysfunction (impaired relaxation).   2. Right ventricular systolic function is normal. The right ventricular  size is normal. There is normal pulmonary artery  systolic pressure.   3. The mitral valve is normal in structure. Trivial mitral valve  regurgitation. No evidence of mitral stenosis.   4. The aortic valve is tricuspid. There is mild calcification of the  aortic valve. There is mild thickening of the aortic valve. Aortic valve  regurgitation is not visualized. No aortic stenosis is present.   5. The inferior vena cava is normal in size with greater than 50%  respiratory variability, suggesting right atrial pressure of 3 mmHg.   FINDINGS   Left Ventricle: Left ventricular ejection fraction, by estimation, is 25  to 30%. Left ventricular ejection fraction by PLAX is 34 %. The left  ventricle has severely decreased function. The left ventricle demonstrates  global hypokinesis. Definity   contrast agent was given IV to delineate the left ventricular endocardial  borders. The left ventricular internal cavity size was normal in size.  There is mild concentric left ventricular hypertrophy. Left ventricular  diastolic parameters are consistent  with Grade I diastolic dysfunction (impaired relaxation). Indeterminate  filling pressures.   Right Ventricle: The right ventricular size is normal. No increase in  right ventricular wall thickness. Right ventricular systolic function is  normal. There is normal pulmonary artery systolic pressure. The tricuspid  regurgitant velocity is 1.77 m/s, and   with an assumed right atrial pressure of 3 mmHg, the estimated right  ventricular systolic pressure is 15.5 mmHg.   Left Atrium: Left atrial size was normal in size.   Right Atrium: Right atrial size was normal in size.   Pericardium: There is no evidence of pericardial effusion.   Mitral Valve: The mitral valve is normal in structure. Trivial mitral  valve regurgitation. No evidence of mitral valve stenosis.   Tricuspid Valve: The tricuspid valve is normal in structure. Tricuspid  valve regurgitation is trivial. No evidence of tricuspid stenosis.    Aortic Valve: The aortic valve is tricuspid. There is mild calcification  of the aortic valve. There is mild thickening of the aortic valve. Aortic  valve regurgitation is not visualized. No aortic stenosis is present.   Pulmonic Valve: The pulmonic valve was normal in structure. Pulmonic valve  regurgitation is not visualized. No evidence of pulmonic stenosis.   Aorta: The aortic root is normal in size and structure.  Venous: The inferior vena cava is normal in size with greater than 50%  respiratory variability, suggesting right atrial pressure of 3 mmHg.   IAS/Shunts: No atrial level shunt detected by color flow Doppler.   Transesophageal Echocardiogram (09/16/2023) IMPRESSIONS   1. Left ventricular ejection fraction, by estimation, is 25 to 30%. The  left ventricle has severely decreased function.   2. Right ventricular systolic function is normal. The right ventricular  size is normal.   3. Left atrial size was mildly dilated. No left atrial/left atrial  appendage thrombus was detected. The LAA emptying velocity was 60 cm/s.   4. The mitral valve is normal in structure. Trivial mitral valve  regurgitation. No evidence of mitral stenosis.   5. The aortic valve is tricuspid. Aortic valve regurgitation is not  visualized. No aortic stenosis is present.   6. There is mild (Grade II) plaque involving the descending aorta.   7. Evidence of atrial level shunting detected by color flow Doppler.  Agitated saline contrast bubble study was positive with shunting observed  within 3-6 cardiac cycles suggestive of interatrial shunt. There is a  moderately sized secundum atrial septal  defect with bidirectional shunting across the atrial septum.   Conclusion(s)/Recommendation(s): Findings are concerning for an  interatrial shunt as detailed above.   FINDINGS   Left Ventricle: Left ventricular ejection fraction, by estimation, is 25  to 30%. The left ventricle has severely decreased  function. The left  ventricular internal cavity size was normal in size.   Right Ventricle: The right ventricular size is normal. No increase in  right ventricular wall thickness. Right ventricular systolic function is  normal.   Left Atrium: Left atrial size was mildly dilated. No left atrial/left  atrial appendage thrombus was detected. The LAA emptying velocity was 60  cm/s.   Right Atrium: Right atrial size was normal in size.   Pericardium: There is no evidence of pericardial effusion.   Mitral Valve: The mitral valve is normal in structure. Trivial mitral  valve regurgitation. No evidence of mitral valve stenosis. There is no  evidence of mitral valve vegetation.   Tricuspid Valve: The tricuspid valve is normal in structure. Tricuspid  valve regurgitation is mild . No evidence of tricuspid stenosis. There is  no evidence of tricuspid valve vegetation.   Aortic Valve: The aortic valve is tricuspid. Aortic valve regurgitation is  not visualized. No aortic stenosis is present.   Pulmonic Valve: The pulmonic valve was grossly normal. Pulmonic valve  regurgitation is not visualized. No evidence of pulmonic stenosis. There  is no evidence of pulmonic valve vegetation.   Aorta: The aortic root and ascending aorta are structurally normal, with  no evidence of dilitation. There is mild (Grade II) plaque involving the  descending aorta.   IAS/Shunts: Evidence of atrial level shunting detected by color flow  Doppler. Agitated saline contrast was given intravenously to evaluate for  intracardiac shunting. Agitated saline contrast bubble study was positive  with shunting observed within 3-6  cardiac cycles suggestive of interatrial shunt. There is no evidence of an  atrial septal defect. There is a moderately sized secundum atrial septal  defect with bidirectional shunting across the atrial septum.    Laboratory Data:  High Sensitivity Troponin:   Recent Labs  Lab  09/15/23 0825 09/15/23 1133  TROPONINIHS 11 16     Chemistry Recent Labs  Lab 09/15/23 0828 09/15/23 0833 09/16/23 0541  NA 140 142 141  K 4.7 4.5 4.4  CL 106  107 105  CO2 24  --  24  GLUCOSE 124* 117* 107*  BUN 21 27* 25*  CREATININE 1.34* 1.30* 1.25*  CALCIUM  9.5  --  9.2  MG  --   --  2.1  GFRNONAA 55*  --  >60  ANIONGAP 10  --  12    Recent Labs  Lab 09/15/23 0828  PROT 6.8  ALBUMIN 3.7  AST 24  ALT 20  ALKPHOS 47  BILITOT 0.7   Lipids No results for input(s): CHOL, TRIG, HDL, LABVLDL, LDLCALC, CHOLHDL in the last 168 hours.  Hematology Recent Labs  Lab 09/15/23 0828 09/15/23 0833 09/16/23 0541  WBC 5.1  --  6.2  RBC 5.13  --  4.80  HGB 14.0 14.6 12.9*  HCT 45.6 43.0 41.2  MCV 88.9  --  85.8  MCH 27.3  --  26.9  MCHC 30.7  --  31.3  RDW 15.0  --  14.9  PLT 217  --  216   Thyroid  No results for input(s): TSH, FREET4 in the last 168 hours.  BNPNo results for input(s): BNP, PROBNP in the last 168 hours.  DDimer No results for input(s): DDIMER in the last 168 hours.  Radiology/Studies:  VAS US  LOWER EXTREMITY VENOUS (DVT) Result Date: 09/16/2023  Lower Venous DVT Study Patient Name:  JAKAI RISSE  Date of Exam:   09/16/2023 Medical Rec #: [id number]       Accession #:    [id number] Date of Birth: August 08, 1946      Patient Gender: M Patient Age:   66 years Exam Location:  [facility] Procedure:      VAS US  LOWER EXTREMITY VENOUS (DVT) Referring Phys: ARY CUMMINS --------------------------------------------------------------------------------  Indications: Stroke, and Embolic infarction.  Risk Factors: Cancer Prostate CA. Comparison Study: No prior exam. Performing Technologist: Edilia Elden Appl  Examination Guidelines: A complete evaluation includes B-mode imaging, spectral Doppler, color Doppler, and power Doppler as needed of all accessible portions of each vessel. Bilateral testing is considered an integral part of a complete  examination. Limited examinations for reoccurring indications may be performed as noted. The reflux portion of the exam is performed with the patient in reverse Trendelenburg.  +---------+---------------+---------+-----------+----------+--------------+ RIGHT    CompressibilityPhasicitySpontaneityPropertiesThrombus Aging +---------+---------------+---------+-----------+----------+--------------+ CFV      Full           Yes      Yes                                 +---------+---------------+---------+-----------+----------+--------------+ SFJ      Full           Yes      Yes                                 +---------+---------------+---------+-----------+----------+--------------+ FV Prox  Full                                                        +---------+---------------+---------+-----------+----------+--------------+ FV Mid   Full                                                        +---------+---------------+---------+-----------+----------+--------------+  FV DistalFull                                                        +---------+---------------+---------+-----------+----------+--------------+ PFV      Full                                                        +---------+---------------+---------+-----------+----------+--------------+ POP      Full           Yes      Yes                                 +---------+---------------+---------+-----------+----------+--------------+ PTV      Full                                                        +---------+---------------+---------+-----------+----------+--------------+ PERO     Full                                                        +---------+---------------+---------+-----------+----------+--------------+   +---------+---------------+---------+-----------+----------+--------------+ LEFT     CompressibilityPhasicitySpontaneityPropertiesThrombus Aging  +---------+---------------+---------+-----------+----------+--------------+ CFV      Full           Yes      Yes                                 +---------+---------------+---------+-----------+----------+--------------+ SFJ      Full           Yes      Yes                                 +---------+---------------+---------+-----------+----------+--------------+ FV Prox  Full                                                        +---------+---------------+---------+-----------+----------+--------------+ FV Mid   Full                                                        +---------+---------------+---------+-----------+----------+--------------+ FV DistalFull                                                        +---------+---------------+---------+-----------+----------+--------------+  PFV      Full                                                        +---------+---------------+---------+-----------+----------+--------------+ POP      Full           Yes      Yes                                 +---------+---------------+---------+-----------+----------+--------------+ PTV      Full                                                        +---------+---------------+---------+-----------+----------+--------------+ PERO     Full                                                        +---------+---------------+---------+-----------+----------+--------------+     Summary: BILATERAL: - No evidence of deep vein thrombosis seen in the lower extremities, bilaterally. -No evidence of popliteal cyst, bilaterally.   *See table(s) above for measurements and observations.    Preliminary    ECHO TEE Result Date: 09/16/2023    TRANSESOPHOGEAL ECHO REPORT   Patient Name:   Charles Frost Date of Exam: 09/16/2023 Medical Rec #:  [id number]      Height:       68.0 in Accession #:    [id number]     Weight:       177.2 lb Date of Birth:  1946-02-01     BSA:          1.941  m Patient Age:    77 years       BP:           151/92 mmHg Patient Gender: M              HR:           98 bpm. Exam Location:  Inpatient Procedure: Cardiac Doppler, Color Doppler, 3D Echo, Transesophageal Echo and            Saline Contrast Bubble Study Indications:     Stroke  History:         Patient has prior history of Echocardiogram examinations, most                  recent 09/07/2023. Stroke.  Sonographer:     Tinnie Gosling RDCS Referring Phys:  [id number] ROLLO JONELLE LOUDER Diagnosing Phys: Shelda Bruckner MD PROCEDURE: After discussion of the risks and benefits of a TEE, an informed consent was obtained from the patient. The transesophogeal probe was passed without difficulty through the esophogus of the patient. Local oropharyngeal anesthetic was provided with viscous lidocaine . Sedation performed by different physician. Image quality was excellent. The patient's vital signs; including heart rate, blood pressure, and oxygen  saturation; remained stable throughout the procedure. The patient developed no complications during the procedure.  IMPRESSIONS  1. Left ventricular ejection  fraction, by estimation, is 25 to 30%. The left ventricle has severely decreased function.  2. Right ventricular systolic function is normal. The right ventricular size is normal.  3. Left atrial size was mildly dilated. No left atrial/left atrial appendage thrombus was detected. The LAA emptying velocity was 60 cm/s.  4. The mitral valve is normal in structure. Trivial mitral valve regurgitation. No evidence of mitral stenosis.  5. The aortic valve is tricuspid. Aortic valve regurgitation is not visualized. No aortic stenosis is present.  6. There is mild (Grade II) plaque involving the descending aorta.  7. Evidence of atrial level shunting detected by color flow Doppler. Agitated saline contrast bubble study was positive with shunting observed within 3-6 cardiac cycles suggestive of interatrial shunt. There is a moderately  sized secundum atrial septal defect with bidirectional shunting across the atrial septum. Conclusion(s)/Recommendation(s): Findings are concerning for an interatrial shunt as detailed above. FINDINGS  Left Ventricle: Left ventricular ejection fraction, by estimation, is 25 to 30%. The left ventricle has severely decreased function. The left ventricular internal cavity size was normal in size. Right Ventricle: The right ventricular size is normal. No increase in right ventricular wall thickness. Right ventricular systolic function is normal. Left Atrium: Left atrial size was mildly dilated. No left atrial/left atrial appendage thrombus was detected. The LAA emptying velocity was 60 cm/s. Right Atrium: Right atrial size was normal in size. Pericardium: There is no evidence of pericardial effusion. Mitral Valve: The mitral valve is normal in structure. Trivial mitral valve regurgitation. No evidence of mitral valve stenosis. There is no evidence of mitral valve vegetation. Tricuspid Valve: The tricuspid valve is normal in structure. Tricuspid valve regurgitation is mild . No evidence of tricuspid stenosis. There is no evidence of tricuspid valve vegetation. Aortic Valve: The aortic valve is tricuspid. Aortic valve regurgitation is not visualized. No aortic stenosis is present. Pulmonic Valve: The pulmonic valve was grossly normal. Pulmonic valve regurgitation is not visualized. No evidence of pulmonic stenosis. There is no evidence of pulmonic valve vegetation. Aorta: The aortic root and ascending aorta are structurally normal, with no evidence of dilitation. There is mild (Grade II) plaque involving the descending aorta. IAS/Shunts: Evidence of atrial level shunting detected by color flow Doppler. Agitated saline contrast was given intravenously to evaluate for intracardiac shunting. Agitated saline contrast bubble study was positive with shunting observed within 3-6 cardiac cycles suggestive of interatrial shunt.  There is no evidence of an atrial septal defect. There is a moderately sized secundum atrial septal defect with bidirectional shunting across the atrial septum. Additional Comments: Spectral Doppler performed. TRICUSPID VALVE TR Peak grad:   29.2 mmHg TR Vmax:        270.00 cm/s Shelda Bruckner MD Electronically signed by Shelda Bruckner MD Signature Date/Time: 09/16/2023/2:22:28 PM    Final    EP STUDY Result Date: 09/16/2023 See surgical note for result.  DG Chest 1 View Result Date: 09/15/2023 CLINICAL DATA:  Dizziness. EXAM: CHEST  1 VIEW COMPARISON:  None Available. FINDINGS: No focal consolidation, pleural effusion, or pneumothorax. The cardiac silhouette is within normal limits. No acute osseous pathology. IMPRESSION: No active disease. Electronically Signed   By: Vanetta Chou M.D.   On: 09/15/2023 14:38   MR BRAIN WO CONTRAST Result Date: 09/15/2023 CLINICAL DATA:  Neuro deficit, acute, stroke suspected. EXAM: MRI HEAD WITHOUT CONTRAST TECHNIQUE: Multiplanar, multiecho pulse sequences of the brain and surrounding structures were obtained without intravenous contrast. COMPARISON:  CT studies earlier same day.  MRI  09/07/2023. FINDINGS: Brain: Newly seen cluster of small acute infarctions affecting the cortical brain in the left occipital region. Previously seen acute infarction just anterior to that does not show any progression. Newly seen foci of acute cortical infarction in the posterior left parietal lobe. Findings consistent with recurrent embolic infarctions. Chronic small-vessel ischemic changes remain evident within the pons. Numerous old small vessel cerebellar infarctions. Old infarction of the right occipital cortical and subcortical brain. Advanced chronic small-vessel ischemic changes throughout the cerebral hemispheric white matter. No evidence of acute hemorrhage, hydrocephalus or extra-axial collection. Chronic hemosiderin deposition associated with many of the old  infarctions. Vascular: Major vessels at the base of the brain show flow. Skull and upper cervical spine: Negative Sinuses/Orbits: Clear/normal Other: None IMPRESSION: 1. Newly seen cluster of small acute infarctions affecting the cortical brain in the left occipital region. Previously seen acute infarction just anterior to that does not show any progression. Newly seen foci of acute cortical infarction in the posterior left parietal lobe. Findings consistent with recurrent embolic infarctions. 2. Advanced chronic small-vessel ischemic changes elsewhere in the brain as outlined above. Electronically Signed   By: Oneil Officer M.D.   On: 09/15/2023 12:09   CT ABDOMEN PELVIS W CONTRAST Result Date: 09/15/2023 CLINICAL DATA:  LLQ abdominal pain. EXAM: CT ABDOMEN AND PELVIS WITH CONTRAST TECHNIQUE: Multidetector CT imaging of the abdomen and pelvis was performed using the standard protocol following bolus administration of intravenous contrast. RADIATION DOSE REDUCTION: This exam was performed according to the departmental dose-optimization program which includes automated exposure control, adjustment of the mA and/or kV according to patient size and/or use of iterative reconstruction technique. CONTRAST:  65mL OMNIPAQUE  IOHEXOL  350 MG/ML SOLN COMPARISON:  CT scan renal stone protocol 12/04/2016. FINDINGS: Examination is suboptimal due to patient's motion during data acquisition. Lower chest: The lung bases are clear. No pleural effusion. The heart is normal in size. No pericardial effusion. Hepatobiliary: The liver is normal in size. Non-cirrhotic configuration. No suspicious mass. Redemonstration of 2 subcapsular hypoattenuating structures in the right hepatic lobe, segments 5/6 which are not well evaluated due to extensive motion at these levels. However, these lesions were smaller but present on the prior examination from 2018. Consider further evaluation with nonemergent contrast-enhanced MRI abdomen as per liver  mass protocol. No intrahepatic or extrahepatic bile duct dilation. No calcified gallstones. Normal gallbladder wall thickness. No pericholecystic inflammatory changes. Pancreas: Small focus of sub 5 mm dystrophic calcification in the uncinate process is nonspecific but can be seen as a sequela of previous episodes of chronic pancreatitis. Otherwise no suspicious pancreatic lesion. No pancreatic ductal dilatation or surrounding inflammatory changes. Spleen: Within normal limits. No focal lesion. Adrenals/Urinary Tract: Limited evaluation due to extensive motion at this level. Multiple areas of scarring noted in the left kidney. There is excreted contrast in bilateral renal collecting systems, which limits evaluation for nonobstructing calculi. No hydroureteronephrosis on either side. Urinary bladder is under distended, precluding optimal assessment. However, no large mass or stones identified. No perivesical fat stranding. Stomach/Bowel: No disproportionate dilation of the small or large bowel loops. No evidence of abnormal bowel wall thickening or inflammatory changes. The appendix is unremarkable. There are multiple diverticula throughout the colon, without imaging signs of diverticulitis. Vascular/Lymphatic: No ascites or pneumoperitoneum. No abdominal or pelvic lymphadenopathy, by size criteria. No aneurysmal dilation of the major abdominal arteries. There are moderate peripheral atherosclerotic vascular calcifications of the aorta and its major branches. Reproductive: Enlarged prostate. Symmetric seminal vesicles. Other: There are small  fat containing umbilical and left inguinal hernias. The soft tissues and abdominal wall are otherwise unremarkable. Musculoskeletal: No suspicious osseous lesions. There are mild multilevel degenerative changes in the visualized spine. IMPRESSION: 1. Examination is degraded by motion. 2. No acute inflammatory process identified within the abdomen or pelvis. 3. There are 2, slowly  growing subcapsular hypoattenuating structures in the right hepatic lobe, segments 5/6, not well evaluated on this exam. Consider nonemergent contrast-enhanced MRI abdomen as per liver mass protocol for better characterization. 4. Multiple other nonacute observations, as described above. Electronically Signed   By: Ree Molt M.D.   On: 09/15/2023 10:53   CT HEAD CODE STROKE WO CONTRAST Result Date: 09/15/2023 CLINICAL DATA:  Provided history: Neuro deficit, acute, stroke suspected. EXAM: CT ANGIOGRAPHY HEAD AND NECK CT PERFUSION BRAIN TECHNIQUE: Multidetector CT imaging of the head and neck was performed using the standard protocol during bolus administration of intravenous contrast. Multiplanar CT image reconstructions and MIPs were obtained to evaluate the vascular anatomy. Carotid stenosis measurements (when applicable) are obtained utilizing NASCET criteria, using the distal internal carotid diameter as the denominator. Multiphase CT imaging of the brain was performed following IV bolus contrast injection. Subsequent parametric perfusion maps were calculated using RAPID software. RADIATION DOSE REDUCTION: This exam was performed according to the departmental dose-optimization program which includes automated exposure control, adjustment of the mA and/or kV according to patient size and/or use of iterative reconstruction technique. CONTRAST:  Administered contrast not known at this time. COMPARISON:  Noncontrast head CT 09/08/2023. CT angiogram head/neck 09/07/2023. Brain MRI 09/07/2023. FINDINGS: CT HEAD FINDINGS Brain: No age advanced or lobar predominant parenchymal atrophy. Known small acute/subacute cortical/subcortical infarcts within the left parietal and left occipital lobes. Chronic cortical/subcortical infarct within the right occipital lobe. Chronic lacunar infarct within the left parietal white matter. Background advanced patchy and ill-defined hypoattenuation within the cerebral white matter,  nonspecific but compatible chronic small vessel disease. Small chronic infarcts within the bilateral cerebellar hemispheres. No interval acute demarcated cortical infarct is identified. No acute intracranial hemorrhage. No evidence of an intracranial mass. No chronic intracranial blood products. No extra-axial fluid collection. No midline shift. Vascular: No hyperdense vessel. Atherosclerotic calcifications. Skull: No calvarial fracture or aggressive osseous lesion. Sinuses/Orbits: No orbital mass or acute orbital finding. Small volume frothy secretions within the left sphenoid sinus. ASPECTS ([place]berta Stroke Program Early CT Score) - Ganglionic level infarction (caudate, lentiform nuclei, internal capsule, insula, M1-M3 cortex): 7 - Supraganglionic infarction (M4-M6 cortex): 3 Total score (0-10 with 10 being normal): 10 (when discounting the previously demonstrated, known acute/subacute cortically-based infarcts described above). No evidence of interval acute intracranial abnormality. These results were communicated to Dr. Arora At 8:51 amon 1/7/2025by text page via the [facility] messaging system. Review of the MIP images confirms the above findings CTA NECK FINDINGS Aortic arch: Standard aortic branching. Atherosclerotic plaque within the visualized thoracic aorta and proximal major branch vessels of the neck. Streak/beam hardening artifact arising from a dense left-sided contrast bolus partially obscures the left subclavian artery. Within this limitation, there is no appreciable hemodynamically significant innominate or proximal subclavian artery stenosis. Right carotid system: CCA and ICA patent within the neck without stenosis. Minimal atherosclerotic plaque within the proximal ICA. Partially retropharyngeal course of the cervical ICA. Left carotid system: CCA and ICA patent within the neck without stenosis or significant atherosclerotic disease. Vertebral arteries: Codominant and patent within the neck without  stenosis. Nonstenotic atherosclerotic plaque at the right vertebral artery origin. Skeleton: Cervical spondylosis. No acute fracture  or aggressive osseous lesion. Other neck: No neck mass or cervical lymphadenopathy. Subcentimeter thyroid  nodules not meeting consensus criteria for ultrasound follow-up based on size. No follow-up imaging recommended. Reference: J Am Coll Radiol. 2015 Feb;12(2): 143-50. Upper chest: No consolidation within the imaged lung apices. Review of the MIP images confirms the above findings CTA HEAD FINDINGS Anterior circulation: The intracranial internal carotid arteries are patent. Nonstenotic atherosclerotic plaque within both vessels The M1 middle cerebral arteries are patent. No M2 proximal branch occlusion or high-grade proximal stenosis. The anterior cerebral arteries are patent. No intracranial aneurysm is identified. Posterior circulation: The intracranial vertebral arteries are patent. The basilar artery is patent. The posterior cerebral arteries are patent. Hypoplastic right P1 segment with sizable right posterior communicating artery. The left posterior communicating artery is diminutive or absent. Venous sinuses: Within the limitations of contrast timing, no convincing thrombus. Anatomic variants: As described. Review of the MIP images confirms the above findings No emergent large vessel occlusion identified. These results were communicated to Dr. Voncile at 9:04 amon 1/7/2025by text page via the [facility] messaging system. CT Brain Perfusion Findings: CBF (<30%) Volume: 0mL Perfusion (Tmax>6.0s) volume: 0mL Mismatch Volume: 0mL Infarction Location:None identified via perfusion data. IMPRESSION: Non-contrast head CT: 1. 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 2. No acute intracranial hemorrhage or interval acute demarcated cortical infarct. 3. Background advanced chronic small vessel ischemic disease. 4. Left sphenoid  sinusitis. CTA neck: 1. The common carotid, internal carotid and vertebral arteries are patent within the neck without stenosis. Minimal atherosclerotic plaque within the proximal right ICA. Non-stenotic atherosclerotic plaque at the right vertebral artery origin. 2. Aortic Atherosclerosis (ICD10-I70.0). CTA head: 1. No emergent large vessel occlusion identified. 2. Non-stenotic atherosclerotic plaque within the intracranial internal carotid arteries. CT perfusion head: The perfusion software identifies no core infarct. The perfusion software identifies no critically hypoperfused parenchyma (utilizing the Tmax>6 seconds threshold). No mismatch volume reported. Electronically Signed   By: Rockey Childs D.O.   On: 09/15/2023 09:07   CT ANGIO HEAD NECK W WO CM W PERF (CODE STROKE) Result Date: 09/15/2023 CLINICAL DATA:  Provided history: Neuro deficit, acute, stroke suspected. EXAM: CT ANGIOGRAPHY HEAD AND NECK CT PERFUSION BRAIN TECHNIQUE: Multidetector CT imaging of the head and neck was performed using the standard protocol during bolus administration of intravenous contrast. Multiplanar CT image reconstructions and MIPs were obtained to evaluate the vascular anatomy. Carotid stenosis measurements (when applicable) are obtained utilizing NASCET criteria, using the distal internal carotid diameter as the denominator. Multiphase CT imaging of the brain was performed following IV bolus contrast injection. Subsequent parametric perfusion maps were calculated using RAPID software. RADIATION DOSE REDUCTION: This exam was performed according to the departmental dose-optimization program which includes automated exposure control, adjustment of the mA and/or kV according to patient size and/or use of iterative reconstruction technique. CONTRAST:  Administered contrast not known at this time. COMPARISON:  Noncontrast head CT 09/08/2023. CT angiogram head/neck 09/07/2023. Brain MRI 09/07/2023. FINDINGS: CT HEAD FINDINGS  Brain: No age advanced or lobar predominant parenchymal atrophy. Known small acute/subacute cortical/subcortical infarcts within the left parietal and left occipital lobes. Chronic cortical/subcortical infarct within the right occipital lobe. Chronic lacunar infarct within the left parietal white matter. Background advanced patchy and ill-defined hypoattenuation within the cerebral white matter, nonspecific but compatible chronic small vessel disease. Small chronic infarcts within the bilateral cerebellar hemispheres. No interval acute demarcated cortical infarct is identified. No acute intracranial hemorrhage. No evidence of an intracranial  mass. No chronic intracranial blood products. No extra-axial fluid collection. No midline shift. Vascular: No hyperdense vessel. Atherosclerotic calcifications. Skull: No calvarial fracture or aggressive osseous lesion. Sinuses/Orbits: No orbital mass or acute orbital finding. Small volume frothy secretions within the left sphenoid sinus. ASPECTS ([place]ta Stroke Program Early CT Score) - Ganglionic level infarction (caudate, lentiform nuclei, internal capsule, insula, M1-M3 cortex): 7 - Supraganglionic infarction (M4-M6 cortex): 3 Total score (0-10 with 10 being normal): 10 (when discounting the previously demonstrated, known acute/subacute cortically-based infarcts described above). No evidence of interval acute intracranial abnormality. These results were communicated to Dr. Arora At 8:51 amon 1/7/2025by text page via the [facility] messaging system. Review of the MIP images confirms the above findings CTA NECK FINDINGS Aortic arch: Standard aortic branching. Atherosclerotic plaque within the visualized thoracic aorta and proximal major branch vessels of the neck. Streak/beam hardening artifact arising from a dense left-sided contrast bolus partially obscures the left subclavian artery. Within this limitation, there is no appreciable hemodynamically significant innominate or  proximal subclavian artery stenosis. Right carotid system: CCA and ICA patent within the neck without stenosis. Minimal atherosclerotic plaque within the proximal ICA. Partially retropharyngeal course of the cervical ICA. Left carotid system: CCA and ICA patent within the neck without stenosis or significant atherosclerotic disease. Vertebral arteries: Codominant and patent within the neck without stenosis. Nonstenotic atherosclerotic plaque at the right vertebral artery origin. Skeleton: Cervical spondylosis. No acute fracture or aggressive osseous lesion. Other neck: No neck mass or cervical lymphadenopathy. Subcentimeter thyroid  nodules not meeting consensus criteria for ultrasound follow-up based on size. No follow-up imaging recommended. Reference: J Am Coll Radiol. 2015 Feb;12(2): 143-50. Upper chest: No consolidation within the imaged lung apices. Review of the MIP images confirms the above findings CTA HEAD FINDINGS Anterior circulation: The intracranial internal carotid arteries are patent. Nonstenotic atherosclerotic plaque within both vessels The M1 middle cerebral arteries are patent. No M2 proximal branch occlusion or high-grade proximal stenosis. The anterior cerebral arteries are patent. No intracranial aneurysm is identified. Posterior circulation: The intracranial vertebral arteries are patent. The basilar artery is patent. The posterior cerebral arteries are patent. Hypoplastic right P1 segment with sizable right posterior communicating artery. The left posterior communicating artery is diminutive or absent. Venous sinuses: Within the limitations of contrast timing, no convincing thrombus. Anatomic variants: As described. Review of the MIP images confirms the above findings No emergent large vessel occlusion identified. These results were communicated to Dr. Voncile at 9:04 amon 1/7/2025by text page via the [facility] messaging system. CT Brain Perfusion Findings: CBF (<30%) Volume: 0mL Perfusion  (Tmax>6.0s) volume: 0mL Mismatch Volume: 0mL Infarction Location:None identified via perfusion data. IMPRESSION: Non-contrast head CT: 1. 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 2. No acute intracranial hemorrhage or interval acute demarcated cortical infarct. 3. Background advanced chronic small vessel ischemic disease. 4. Left sphenoid sinusitis. CTA neck: 1. The common carotid, internal carotid and vertebral arteries are patent within the neck without stenosis. Minimal atherosclerotic plaque within the proximal right ICA. Non-stenotic atherosclerotic plaque at the right vertebral artery origin. 2. Aortic Atherosclerosis (ICD10-I70.0). CTA head: 1. No emergent large vessel occlusion identified. 2. Non-stenotic atherosclerotic plaque within the intracranial internal carotid arteries. CT perfusion head: The perfusion software identifies no core infarct. The perfusion software identifies no critically hypoperfused parenchyma (utilizing the Tmax>6 seconds threshold). No mismatch volume reported. Electronically Signed   By: Rockey Childs D.O.   On: 09/15/2023 09:07   Assessment and Plan:   Heart  failure with reduced ejection fraction Echo from 12/30 showed LVEF 25-30%, global hypokinesis, mild concentric LVH, grade I diastolic dysfunction, normal RV size and function, normal pulmonary artery systolic pressure, trivial MR, mild calcification and mild thickening of aortic valve, normal IVC size. TEE from 1/8 showed: LVEF 25-30%, normal RV size and function, mildly dilated left atrium, no LA/LAA thrombus, normal right atrium size, trivial MR, mild TR, mild (Grade II) plaque involving ascending aorta, moderately sized secundum atrial septal defect with bidirectional shunting across the atrial septum It was concluded that the ASD with bidirectional flow that was discovered during the TEE was likely the cause of his stroke  Patient was supposed to follow up  outpatient to start GDMT after his first stroke 12/30. Inpatient he was only started on Toprol  25 mg daily, Entresto  24-26 mg BID with goals to start spironolactone and an SGLT2i outpatient. -- After speaking with MD, patient will be discharged home on Eliquis  and will follow up outpatient, appointment made 09/28/2023, at that time will discuss further treatment options for heart failure and ASD closure  Secundum ASD with bidirectional flow  Discussed findings of TEE with patient -- Patient will be discharged home on Eliquis  and follow up outpatient on 09/28/2023 to further treat his heart failure and discuss treatment options for his ASD   Hypertension Most recent BP reading 130/73 -- Continue Toprol  25 mg daily  Hyperlipidemia Lipid panel from 09/08/2023 showed: total cholesterol 162, HDL 51, LDL 105, trigs 29 -- Continue Crestor  20 mg daily    Stroke, 09/07/23 and 09/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

## 2023-09-16 NOTE — Assessment & Plan Note (Addendum)
 Last Echo in 09/07/23 with EF 25-30%. Home meds include Entresto, metoprolol. Patient appears euvolemic at this time.  - cont home metoprolol succinate 25 daily - strict I/o - daily weight

## 2023-09-17 ENCOUNTER — Other Ambulatory Visit: Payer: Self-pay | Admitting: Cardiology

## 2023-09-17 ENCOUNTER — Other Ambulatory Visit (HOSPITAL_COMMUNITY): Payer: Self-pay

## 2023-09-17 ENCOUNTER — Encounter: Payer: Self-pay | Admitting: *Deleted

## 2023-09-17 ENCOUNTER — Encounter (HOSPITAL_COMMUNITY): Payer: Self-pay | Admitting: Cardiology

## 2023-09-17 ENCOUNTER — Ambulatory Visit: Payer: Medicare HMO | Attending: Cardiology

## 2023-09-17 DIAGNOSIS — I639 Cerebral infarction, unspecified: Secondary | ICD-10-CM

## 2023-09-17 DIAGNOSIS — R42 Dizziness and giddiness: Secondary | ICD-10-CM

## 2023-09-17 DIAGNOSIS — I429 Cardiomyopathy, unspecified: Secondary | ICD-10-CM

## 2023-09-17 DIAGNOSIS — I63412 Cerebral infarction due to embolism of left middle cerebral artery: Secondary | ICD-10-CM | POA: Diagnosis not present

## 2023-09-17 NOTE — Progress Notes (Unsigned)
 Enrolled for [employer] to mail a ZIO XT long term holter monitor to the patients address on file.

## 2023-09-17 NOTE — Progress Notes (Signed)
 Heart Failure Navigator Progress Note  Assessed for Heart & Vascular TOC clinic readiness.  Patient has a scheduled [facility] appointment on 09/28/2023. No TOC at this time.    Navigator will sign off at this time.   Stephane Haddock, BSN, Scientist, Clinical (histocompatibility And Immunogenetics) Only

## 2023-09-17 NOTE — Discharge Summary (Addendum)
 Family Medicine Teaching [facility] Discharge Summary  Patient name: Charles Frost Medical record number: [id number] Date of birth: 06-19-1946 Age: 78 y.o. Gender: male Date of Admission: 09/15/2023  Date of Discharge: 09/17/23 Admitting Physician: Laurier Hugger, MD  Primary Care Provider: Orlando Dwayne NOVAK, FNP Consultants: neurology, cardiology  Indication for Hospitalization: acute stroke  Discharge Diagnoses/Problem List:  Principal Problem for Admission: stroke Other Problems addressed during stay:  Principal Problem:   Stroke [facility]) Active Problems:   HFrEF (heart failure with reduced ejection fraction) (HCC)   Chronic heart failure with reduced ejection fraction (HFrEF, <= 40%) [facility])   Hypercholesteremia   Brief Hospital Course:  Charles Frost is a 78 y.o.male with a history of hypertension, hyperlipidemia, RA, prediabetes, prostate cancer, GERD who was admitted to the family medicine teaching Service at [facility] for stroke. His hospital course is detailed below:  Stroke Presented with dizziness and presyncopal event.  MRI brain with acute cortical infarction in posterior left parietal lobe.  Concern for cardiac source, TEE with ASD with both right to left and left to right flow, likely etiology of his stroke vs cardiomyopathy with low EF.  Lower extremity Doppler obtained that was negative for DVT.  Symptoms resolved and patient was at baseline prior to discharge, will be discharged on Eliquis  and DAPT was discontinued as he failed this regimen (current stroke occurred while on DAPT).  HFrEF Echo 09/07/2023 with EF 25 to 30%.  Continued his home medications.  Other chronic conditions were medically managed with home medications and formulary alternatives as necessary   PCP Follow-up Recommendations: CTAP with 2 slowly growing subcapsular hypoattenuating structures in right hepatic lobe - f/u with contrast MRI Will need cardiac ischemic evaluation - f/u with cardiology  as scheduled    Disposition: home  Discharge Condition: stable  Discharge Exam:  Vitals:   09/17/23 0419 09/17/23 0744  BP: 127/74 126/81  Pulse: 75 79  Resp: 18 17  Temp: 98.2 F (36.8 C) 97.9 F (36.6 C)  SpO2: 96% 98%   General: well appearing, NAD Cardiovascular: RRR, no m/r/g Respiratory: normal work of breathing on RA, CTAB Abdomen: Normal bowel sounds, soft Extremities: No swelling BLE A&Ox4  Significant Procedures: TEE  Significant Labs and Imaging:  Recent Labs  Lab 09/16/23 0541  WBC 6.2  HGB 12.9*  HCT 41.2  PLT 216   Recent Labs  Lab 09/16/23 0541  NA 141  K 4.4  CL 105  CO2 24  GLUCOSE 107*  BUN 25*  CREATININE 1.25*  CALCIUM  9.2  MG 2.1   TEE 09/16/23 IMPRESSIONS   1. Left ventricular ejection fraction, by estimation, is 25 to 30%. The  left ventricle has severely decreased function.   2. Right ventricular systolic function is normal. The right ventricular  size is normal.   3. Left atrial size was mildly dilated. No left atrial/left atrial  appendage thrombus was detected. The LAA emptying velocity was 60 cm/s.   4. The mitral valve is normal in structure. Trivial mitral valve  regurgitation. No evidence of mitral stenosis.   5. The aortic valve is tricuspid. Aortic valve regurgitation is not  visualized. No aortic stenosis is present.   6. There is mild (Grade II) plaque involving the descending aorta.   7. Evidence of atrial level shunting detected by color flow Doppler.  Agitated saline contrast bubble study was positive with shunting observed  within 3-6 cardiac cycles suggestive of interatrial shunt. There is a  moderately sized secundum  atrial septal  defect with bidirectional shunting across the atrial septum.   IMPRESSION: Non-contrast head CT:   1. 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 2. No acute intracranial hemorrhage or interval acute demarcated cortical  infarct. 3. Background advanced chronic small vessel ischemic disease. 4. Left sphenoid sinusitis.   CTA neck:   1. The common carotid, internal carotid and vertebral arteries are patent within the neck without stenosis. Minimal atherosclerotic plaque within the proximal right ICA. Non-stenotic atherosclerotic plaque at the right vertebral artery origin. 2. Aortic Atherosclerosis (ICD10-I70.0).   CTA head:   1. No emergent large vessel occlusion identified. 2. Non-stenotic atherosclerotic plaque within the intracranial internal carotid arteries.   CT perfusion head:   The perfusion software identifies no core infarct. The perfusion software identifies no critically hypoperfused parenchyma (utilizing the Tmax>6 seconds threshold). No mismatch volume reported.   CT A/P IMPRESSION: 1. Examination is degraded by motion. 2. No acute inflammatory process identified within the abdomen or pelvis. 3. There are 2, slowly growing subcapsular hypoattenuating structures in the right hepatic lobe, segments 5/6, not well evaluated on this exam. Consider nonemergent contrast-enhanced MRI abdomen as per liver mass protocol for better characterization. 4. Multiple other nonacute observations, as described above.   CXR  IMPRESSION: No active disease.  Results/Tests Pending at Time of Discharge: none  Discharge Medications:  Allergies as of 09/17/2023   No Known Allergies      Medication List     STOP taking these medications    aspirin  EC 81 MG tablet   clopidogrel  75 MG tablet Commonly known as: PLAVIX    tamsulosin  0.4 MG Caps capsule Commonly known as: FLOMAX        TAKE these medications    acetaminophen  500 MG tablet Commonly known as: TYLENOL  Take 1,000 mg by mouth 2 (two) times daily as needed for moderate pain (pain score 4-6), fever or headache.   apixaban  5 MG Tabs tablet Commonly known as: ELIQUIS  Take 1 tablet (5 mg total) by mouth 2 (two) times daily.    gabapentin  300 MG capsule Commonly known as: NEURONTIN  Take 300 mg by mouth at bedtime.   Humira (2 Pen) 40 MG/0.4ML pen Generic drug: adalimumab Inject 40 mg into the skin See admin instructions. Inject 40mg  subcutaneously every other Monday.   metoprolol  succinate 25 MG 24 hr tablet Commonly known as: TOPROL -XL Take 1 tablet (25 mg total) by mouth daily.   rosuvastatin  20 MG tablet Commonly known as: CRESTOR  Take 1 tablet (20 mg total) by mouth daily.   sacubitril -valsartan  24-26 MG Commonly known as: ENTRESTO  Take 1 tablet by mouth 2 (two) times daily. What changed: when to take this   Voltaren 1 % Gel Generic drug: diclofenac Sodium Apply 2 g topically 2 (two) times daily as needed (pain).        Discharge Instructions: Please refer to Patient Instructions section of EMR for full details.  Patient was counseled important signs and symptoms that should prompt return to medical care, changes in medications, dietary instructions, activity restrictions, and follow up appointments.   Follow-Up Appointments:  Follow-up Information     [facility]. Schedule an appointment as soon as possible for a visit.   Specialty: Rehabilitation Contact information: [address] [place]  [address] [phone]        Orlando Dwayne NOVAK, FNP. Schedule an appointment as soon as possible for a visit in 1 week(s).   Specialty: Nurse Practitioner  Contact information: [address] [place] [address] [phone]         Rosemarie Eather RAMAN, MD. Schedule an appointment as soon as possible for a visit in 1 month(s).   Specialties: Neurology, Radiology Why: stroke clinic Contact information: [address] [place] [address] [phone]                 Stoney Blizzard, DO 09/17/2023, 9:19 AM PGY-1, [facility]   Upper Level Addendum:  I agree with the medical decision making as noted  above.  Laurier Hugger, MD PGY-3 [facility] Family Medicine Residency

## 2023-09-17 NOTE — Plan of Care (Signed)
  Problem: Health Behavior/Discharge Planning: Goal: Ability to manage health-related needs will improve Outcome: Progressing   Problem: Activity: Goal: Risk for activity intolerance will decrease Outcome: Progressing   Problem: Nutrition: Goal: Adequate nutrition will be maintained Outcome: Progressing   Problem: Elimination: Goal: Will not experience complications related to bowel motility Outcome: Progressing   Problem: Pain Management: Goal: General experience of comfort will improve Outcome: Progressing   Problem: Skin Integrity: Goal: Risk for impaired skin integrity will decrease Outcome: Progressing   Problem: Safety: Goal: Ability to remain free from injury will improve Outcome: Progressing

## 2023-09-17 NOTE — TOC Transition Note (Signed)
 Transition of Care [facility]) - Discharge Note   Patient Details  Name: Charles Frost MRN: [id number] Date of Birth: [birth year]  Transition [facility]) CM/SW Contact:  Andrez JULIANNA George, RN Phone Number: 09/17/2023, 10:45 AM   Clinical Narrative:     Pt is discharging home with outpatient therapy through [facility]. Information on the AVS for patient to call and schedule the first appointment. Pt has supervision at home and transportation to home. TOC pharmacy will provide the first 30 days of eliquis  for free with 30 day coupon.     Final next level of care: OP Rehab Barriers to Discharge: No Barriers Identified   Patient Goals and CMS Choice     Choice offered to / list presented to : Patient      Discharge Placement                       Discharge Plan and Services Additional resources added to the After Visit Summary for     Discharge Planning Services: CM Consult                                 Social Drivers of Health (SDOH) Interventions SDOH Screenings   Food Insecurity: No Food Insecurity (09/15/2023)  Housing: Low Risk  (09/15/2023)  Transportation Needs: No Transportation Needs (09/15/2023)  Utilities: Not At Risk (09/15/2023)  Social Connections: Socially Integrated (09/15/2023)  Tobacco Use: Medium Risk (09/15/2023)     Readmission Risk Interventions     No data to display

## 2023-09-17 NOTE — Progress Notes (Signed)
 STROKE TEAM PROGRESS NOTE   SUBJECTIVE (INTERVAL HISTORY) His wife is at the bedside.  No acute event overnight.  No complaints.  On Eliquis  2 doses now, tolerating well.  Pending discharge.   OBJECTIVE Temp:  [97.6 F (36.4 C)-98.3 F (36.8 C)] 98 F (36.7 C) (01/09 1109) Pulse Rate:  [74-79] 79 (01/09 1109) Cardiac Rhythm: Normal sinus rhythm (01/09 0755) Resp:  [17-18] 17 (01/09 1109) BP: (124-139)/(74-86) 139/86 (01/09 1109) SpO2:  [96 %-98 %] 98 % (01/09 1109)  Recent Labs  Lab 09/15/23 0826  GLUCAP 122*   Recent Labs  Lab 09/15/23 0828 09/15/23 0833 09/16/23 0541  NA 140 142 141  K 4.7 4.5 4.4  CL 106 107 105  CO2 24  --  24  GLUCOSE 124* 117* 107*  BUN 21 27* 25*  CREATININE 1.34* 1.30* 1.25*  CALCIUM  9.5  --  9.2  MG  --   --  2.1   Recent Labs  Lab 09/15/23 0828  AST 24  ALT 20  ALKPHOS 47  BILITOT 0.7  PROT 6.8  ALBUMIN 3.7   Recent Labs  Lab 09/15/23 0828 09/15/23 0833 09/16/23 0541  WBC 5.1  --  6.2  NEUTROABS 2.9  --   --   HGB 14.0 14.6 12.9*  HCT 45.6 43.0 41.2  MCV 88.9  --  85.8  PLT 217  --  216   No results for input(s): CKTOTAL, CKMB, CKMBINDEX, TROPONINI in the last 168 hours. Recent Labs    09/15/23 0828  LABPROT 13.4  INR 1.0   No results for input(s): COLORURINE, LABSPEC, PHURINE, GLUCOSEU, HGBUR, BILIRUBINUR, KETONESUR, PROTEINUR, UROBILINOGEN, NITRITE, LEUKOCYTESUR in the last 72 hours.  Invalid input(s): APPERANCEUR     Component Value Date/Time   CHOL 162 09/08/2023 0629   TRIG 29 09/08/2023 0629   HDL 51 09/08/2023 0629   CHOLHDL 3.2 09/08/2023 0629   VLDL 6 09/08/2023 0629   LDLCALC 105 (H) 09/08/2023 0629   Lab Results  Component Value Date   HGBA1C 6.4 (H) 09/07/2023   No results found for: LABOPIA, COCAINSCRNUR, LABBENZ, AMPHETMU, THCU, LABBARB  Recent Labs  Lab 09/15/23 0828  ETH <10    I have personally reviewed the radiological images below and agree  with the radiology interpretations.  VAS US  LOWER EXTREMITY VENOUS (DVT) Result Date: 09/17/2023  Lower Venous DVT Study Patient Name:  Charles Frost  Date of Exam:   09/16/2023 Medical Rec #: [id number]       Accession #:    [id number] Date of Birth: 08/29/1946      Patient Gender: M Patient Age:   65 years Exam Location:  [facility] Procedure:      VAS US  LOWER EXTREMITY VENOUS (DVT) Referring Phys: ARY CUMMINS --------------------------------------------------------------------------------  Indications: Stroke, and Embolic infarction.  Risk Factors: Cancer Prostate CA. Comparison Study: No prior exam. Performing Technologist: Edilia Elden Appl  Examination Guidelines: A complete evaluation includes B-mode imaging, spectral Doppler, color Doppler, and power Doppler as needed of all accessible portions of each vessel. Bilateral testing is considered an integral part of a complete examination. Limited examinations for reoccurring indications may be performed as noted. The reflux portion of the exam is performed with the patient in reverse Trendelenburg.  +---------+---------------+---------+-----------+----------+--------------+ RIGHT    CompressibilityPhasicitySpontaneityPropertiesThrombus Aging +---------+---------------+---------+-----------+----------+--------------+ CFV      Full           Yes      Yes                                 +---------+---------------+---------+-----------+----------+--------------+  SFJ      Full           Yes      Yes                                 +---------+---------------+---------+-----------+----------+--------------+ FV Prox  Full                                                        +---------+---------------+---------+-----------+----------+--------------+ FV Mid   Full                                                        +---------+---------------+---------+-----------+----------+--------------+ FV DistalFull                                                         +---------+---------------+---------+-----------+----------+--------------+ PFV      Full                                                        +---------+---------------+---------+-----------+----------+--------------+ POP      Full           Yes      Yes                                 +---------+---------------+---------+-----------+----------+--------------+ PTV      Full                                                        +---------+---------------+---------+-----------+----------+--------------+ PERO     Full                                                        +---------+---------------+---------+-----------+----------+--------------+   +---------+---------------+---------+-----------+----------+--------------+ LEFT     CompressibilityPhasicitySpontaneityPropertiesThrombus Aging +---------+---------------+---------+-----------+----------+--------------+ CFV      Full           Yes      Yes                                 +---------+---------------+---------+-----------+----------+--------------+ SFJ      Full           Yes      Yes                                 +---------+---------------+---------+-----------+----------+--------------+  FV Prox  Full                                                        +---------+---------------+---------+-----------+----------+--------------+ FV Mid   Full                                                        +---------+---------------+---------+-----------+----------+--------------+ FV DistalFull                                                        +---------+---------------+---------+-----------+----------+--------------+ PFV      Full                                                        +---------+---------------+---------+-----------+----------+--------------+ POP      Full           Yes      Yes                                  +---------+---------------+---------+-----------+----------+--------------+ PTV      Full                                                        +---------+---------------+---------+-----------+----------+--------------+ PERO     Full                                                        +---------+---------------+---------+-----------+----------+--------------+     Summary: BILATERAL: - No evidence of deep vein thrombosis seen in the lower extremities, bilaterally. -No evidence of popliteal cyst, bilaterally.   *See table(s) above for measurements and observations. Electronically signed by Fonda Rim on 09/17/2023 at 4:42:41 PM.    Final    ECHO TEE Result Date: 09/16/2023    TRANSESOPHOGEAL ECHO REPORT   Patient Name:   Charles Frost Date of Exam: 09/16/2023 Medical Rec #:  [id number]      Height:       68.0 in Accession #:    [id number]     Weight:       177.2 lb Date of Birth:  December 16, 1945     BSA:          1.941 m Patient Age:    77 years       BP:           151/92 mmHg Patient Gender: M  HR:           98 bpm. Exam Location:  Inpatient Procedure: Cardiac Doppler, Color Doppler, 3D Echo, Transesophageal Echo and            [place]ine Contrast Bubble Study Indications:     Stroke  History:         Patient has prior history of Echocardiogram examinations, most                  recent 09/07/2023. Stroke.  Sonographer:     Tinnie Gosling RDCS Referring Phys:  [id number] ROLLO JONELLE LOUDER Diagnosing Phys: Shelda Bruckner MD PROCEDURE: After discussion of the risks and benefits of a TEE, an informed consent was obtained from the patient. The transesophogeal probe was passed without difficulty through the esophogus of the patient. Local oropharyngeal anesthetic was provided with viscous lidocaine . Sedation performed by different physician. Image quality was excellent. The patient's vital signs; including heart rate, blood pressure, and oxygen  saturation; remained stable throughout the  procedure. The patient developed no complications during the procedure.  IMPRESSIONS  1. Left ventricular ejection fraction, by estimation, is 25 to 30%. The left ventricle has severely decreased function.  2. Right ventricular systolic function is normal. The right ventricular size is normal.  3. Left atrial size was mildly dilated. No left atrial/left atrial appendage thrombus was detected. The LAA emptying velocity was 60 cm/s.  4. The mitral valve is normal in structure. Trivial mitral valve regurgitation. No evidence of mitral stenosis.  5. The aortic valve is tricuspid. Aortic valve regurgitation is not visualized. No aortic stenosis is present.  6. There is mild (Grade II) plaque involving the descending aorta.  7. Evidence of atrial level shunting detected by color flow Doppler. Agitated saline contrast bubble study was positive with shunting observed within 3-6 cardiac cycles suggestive of interatrial shunt. There is a moderately sized secundum atrial septal defect with bidirectional shunting across the atrial septum. Conclusion(s)/Recommendation(s): Findings are concerning for an interatrial shunt as detailed above. FINDINGS  Left Ventricle: Left ventricular ejection fraction, by estimation, is 25 to 30%. The left ventricle has severely decreased function. The left ventricular internal cavity size was normal in size. Right Ventricle: The right ventricular size is normal. No increase in right ventricular wall thickness. Right ventricular systolic function is normal. Left Atrium: Left atrial size was mildly dilated. No left atrial/left atrial appendage thrombus was detected. The LAA emptying velocity was 60 cm/s. Right Atrium: Right atrial size was normal in size. Pericardium: There is no evidence of pericardial effusion. Mitral Valve: The mitral valve is normal in structure. Trivial mitral valve regurgitation. No evidence of mitral valve stenosis. There is no evidence of mitral valve vegetation. Tricuspid  Valve: The tricuspid valve is normal in structure. Tricuspid valve regurgitation is mild . No evidence of tricuspid stenosis. There is no evidence of tricuspid valve vegetation. Aortic Valve: The aortic valve is tricuspid. Aortic valve regurgitation is not visualized. No aortic stenosis is present. Pulmonic Valve: The pulmonic valve was grossly normal. Pulmonic valve regurgitation is not visualized. No evidence of pulmonic stenosis. There is no evidence of pulmonic valve vegetation. Aorta: The aortic root and ascending aorta are structurally normal, with no evidence of dilitation. There is mild (Grade II) plaque involving the descending aorta. IAS/Shunts: Evidence of atrial level shunting detected by color flow Doppler. Agitated saline contrast was given intravenously to evaluate for intracardiac shunting. Agitated saline contrast bubble study was positive with shunting observed within 3-6 cardiac cycles suggestive  of interatrial shunt. There is no evidence of an atrial septal defect. There is a moderately sized secundum atrial septal defect with bidirectional shunting across the atrial septum. Additional Comments: Spectral Doppler performed. TRICUSPID VALVE TR Peak grad:   29.2 mmHg TR Vmax:        270.00 cm/s Shelda Bruckner MD Electronically signed by Shelda Bruckner MD Signature Date/Time: 09/16/2023/2:22:28 PM    Final    EP STUDY Result Date: 09/16/2023 See surgical note for result.  DG Chest 1 View Result Date: 09/15/2023 CLINICAL DATA:  Dizziness. EXAM: CHEST  1 VIEW COMPARISON:  None Available. FINDINGS: No focal consolidation, pleural effusion, or pneumothorax. The cardiac silhouette is within normal limits. No acute osseous pathology. IMPRESSION: No active disease. Electronically Signed   By: Vanetta Chou M.D.   On: 09/15/2023 14:38   MR BRAIN WO CONTRAST Result Date: 09/15/2023 CLINICAL DATA:  Neuro deficit, acute, stroke suspected. EXAM: MRI HEAD WITHOUT CONTRAST TECHNIQUE:  Multiplanar, multiecho pulse sequences of the brain and surrounding structures were obtained without intravenous contrast. COMPARISON:  CT studies earlier same day.  MRI 09/07/2023. FINDINGS: Brain: Newly seen cluster of small acute infarctions affecting the cortical brain in the left occipital region. Previously seen acute infarction just anterior to that does not show any progression. Newly seen foci of acute cortical infarction in the posterior left parietal lobe. Findings consistent with recurrent embolic infarctions. Chronic small-vessel ischemic changes remain evident within the pons. Numerous old small vessel cerebellar infarctions. Old infarction of the right occipital cortical and subcortical brain. Advanced chronic small-vessel ischemic changes throughout the cerebral hemispheric white matter. No evidence of acute hemorrhage, hydrocephalus or extra-axial collection. Chronic hemosiderin deposition associated with many of the old infarctions. Vascular: Major vessels at the base of the brain show flow. Skull and upper cervical spine: Negative Sinuses/Orbits: Clear/normal Other: None IMPRESSION: 1. Newly seen cluster of small acute infarctions affecting the cortical brain in the left occipital region. Previously seen acute infarction just anterior to that does not show any progression. Newly seen foci of acute cortical infarction in the posterior left parietal lobe. Findings consistent with recurrent embolic infarctions. 2. Advanced chronic small-vessel ischemic changes elsewhere in the brain as outlined above. Electronically Signed   By: Oneil Officer M.D.   On: 09/15/2023 12:09   CT ABDOMEN PELVIS W CONTRAST Result Date: 09/15/2023 CLINICAL DATA:  LLQ abdominal pain. EXAM: CT ABDOMEN AND PELVIS WITH CONTRAST TECHNIQUE: Multidetector CT imaging of the abdomen and pelvis was performed using the standard protocol following bolus administration of intravenous contrast. RADIATION DOSE REDUCTION: This exam was  performed according to the departmental dose-optimization program which includes automated exposure control, adjustment of the mA and/or kV according to patient size and/or use of iterative reconstruction technique. CONTRAST:  65mL OMNIPAQUE  IOHEXOL  350 MG/ML SOLN COMPARISON:  CT scan renal stone protocol 12/04/2016. FINDINGS: Examination is suboptimal due to patient's motion during data acquisition. Lower chest: The lung bases are clear. No pleural effusion. The heart is normal in size. No pericardial effusion. Hepatobiliary: The liver is normal in size. Non-cirrhotic configuration. No suspicious mass. Redemonstration of 2 subcapsular hypoattenuating structures in the right hepatic lobe, segments 5/6 which are not well evaluated due to extensive motion at these levels. However, these lesions were smaller but present on the prior examination from 2018. Consider further evaluation with nonemergent contrast-enhanced MRI abdomen as per liver mass protocol. No intrahepatic or extrahepatic bile duct dilation. No calcified gallstones. Normal gallbladder wall thickness. No pericholecystic inflammatory changes. Pancreas:  Small focus of sub 5 mm dystrophic calcification in the uncinate process is nonspecific but can be seen as a sequela of previous episodes of chronic pancreatitis. Otherwise no suspicious pancreatic lesion. No pancreatic ductal dilatation or surrounding inflammatory changes. Spleen: Within normal limits. No focal lesion. Adrenals/Urinary Tract: Limited evaluation due to extensive motion at this level. Multiple areas of scarring noted in the left kidney. There is excreted contrast in bilateral renal collecting systems, which limits evaluation for nonobstructing calculi. No hydroureteronephrosis on either side. Urinary bladder is under distended, precluding optimal assessment. However, no large mass or stones identified. No perivesical fat stranding. Stomach/Bowel: No disproportionate dilation of the small or  large bowel loops. No evidence of abnormal bowel wall thickening or inflammatory changes. The appendix is unremarkable. There are multiple diverticula throughout the colon, without imaging signs of diverticulitis. Vascular/Lymphatic: No ascites or pneumoperitoneum. No abdominal or pelvic lymphadenopathy, by size criteria. No aneurysmal dilation of the major abdominal arteries. There are moderate peripheral atherosclerotic vascular calcifications of the aorta and its major branches. Reproductive: Enlarged prostate. Symmetric seminal vesicles. Other: There are small fat containing umbilical and left inguinal hernias. The soft tissues and abdominal wall are otherwise unremarkable. Musculoskeletal: No suspicious osseous lesions. There are mild multilevel degenerative changes in the visualized spine. IMPRESSION: 1. Examination is degraded by motion. 2. No acute inflammatory process identified within the abdomen or pelvis. 3. There are 2, slowly growing subcapsular hypoattenuating structures in the right hepatic lobe, segments 5/6, not well evaluated on this exam. Consider nonemergent contrast-enhanced MRI abdomen as per liver mass protocol for better characterization. 4. Multiple other nonacute observations, as described above. Electronically Signed   By: Ree Molt M.D.   On: 09/15/2023 10:53   CT HEAD CODE STROKE WO CONTRAST Result Date: 09/15/2023 CLINICAL DATA:  Provided history: Neuro deficit, acute, stroke suspected. EXAM: CT ANGIOGRAPHY HEAD AND NECK CT PERFUSION BRAIN TECHNIQUE: Multidetector CT imaging of the head and neck was performed using the standard protocol during bolus administration of intravenous contrast. Multiplanar CT image reconstructions and MIPs were obtained to evaluate the vascular anatomy. Carotid stenosis measurements (when applicable) are obtained utilizing NASCET criteria, using the distal internal carotid diameter as the denominator. Multiphase CT imaging of the brain was performed  following IV bolus contrast injection. Subsequent parametric perfusion maps were calculated using RAPID software. RADIATION DOSE REDUCTION: This exam was performed according to the departmental dose-optimization program which includes automated exposure control, adjustment of the mA and/or kV according to patient size and/or use of iterative reconstruction technique. CONTRAST:  Administered contrast not known at this time. COMPARISON:  Noncontrast head CT 09/08/2023. CT angiogram head/neck 09/07/2023. Brain MRI 09/07/2023. FINDINGS: CT HEAD FINDINGS Brain: No age advanced or lobar predominant parenchymal atrophy. Known small acute/subacute cortical/subcortical infarcts within the left parietal and left occipital lobes. Chronic cortical/subcortical infarct within the right occipital lobe. Chronic lacunar infarct within the left parietal white matter. Background advanced patchy and ill-defined hypoattenuation within the cerebral white matter, nonspecific but compatible chronic small vessel disease. Small chronic infarcts within the bilateral cerebellar hemispheres. No interval acute demarcated cortical infarct is identified. No acute intracranial hemorrhage. No evidence of an intracranial mass. No chronic intracranial blood products. No extra-axial fluid collection. No midline shift. Vascular: No hyperdense vessel. Atherosclerotic calcifications. Skull: No calvarial fracture or aggressive osseous lesion. Sinuses/Orbits: No orbital mass or acute orbital finding. Small volume frothy secretions within the left sphenoid sinus. ASPECTS [facility] Stroke Program Early CT Score) - Ganglionic level infarction (caudate, lentiform  nuclei, internal capsule, insula, M1-M3 cortex): 7 - Supraganglionic infarction (M4-M6 cortex): 3 Total score (0-10 with 10 being normal): 10 (when discounting the previously demonstrated, known acute/subacute cortically-based infarcts described above). No evidence of interval acute intracranial  abnormality. These results were communicated to Dr. Arora At 8:51 amon 1/7/2025by text page via the [facility] messaging system. Review of the MIP images confirms the above findings CTA NECK FINDINGS Aortic arch: Standard aortic branching. Atherosclerotic plaque within the visualized thoracic aorta and proximal major branch vessels of the neck. Streak/beam hardening artifact arising from a dense left-sided contrast bolus partially obscures the left subclavian artery. Within this limitation, there is no appreciable hemodynamically significant innominate or proximal subclavian artery stenosis. Right carotid system: CCA and ICA patent within the neck without stenosis. Minimal atherosclerotic plaque within the proximal ICA. Partially retropharyngeal course of the cervical ICA. Left carotid system: CCA and ICA patent within the neck without stenosis or significant atherosclerotic disease. Vertebral arteries: Codominant and patent within the neck without stenosis. Nonstenotic atherosclerotic plaque at the right vertebral artery origin. Skeleton: Cervical spondylosis. No acute fracture or aggressive osseous lesion. Other neck: No neck mass or cervical lymphadenopathy. Subcentimeter thyroid  nodules not meeting consensus criteria for ultrasound follow-up based on size. No follow-up imaging recommended. Reference: J Am Coll Radiol. 2015 Feb;12(2): 143-50. Upper chest: No consolidation within the imaged lung apices. Review of the MIP images confirms the above findings CTA HEAD FINDINGS Anterior circulation: The intracranial internal carotid arteries are patent. Nonstenotic atherosclerotic plaque within both vessels The M1 middle cerebral arteries are patent. No M2 proximal branch occlusion or high-grade proximal stenosis. The anterior cerebral arteries are patent. No intracranial aneurysm is identified. Posterior circulation: The intracranial vertebral arteries are patent. The basilar artery is patent. The posterior cerebral  arteries are patent. Hypoplastic right P1 segment with sizable right posterior communicating artery. The left posterior communicating artery is diminutive or absent. Venous sinuses: Within the limitations of contrast timing, no convincing thrombus. Anatomic variants: As described. Review of the MIP images confirms the above findings No emergent large vessel occlusion identified. These results were communicated to Dr. Voncile at 9:04 amon 1/7/2025by text page via the [facility] messaging system. CT Brain Perfusion Findings: CBF (<30%) Volume: 0mL Perfusion (Tmax>6.0s) volume: 0mL Mismatch Volume: 0mL Infarction Location:None identified via perfusion data. IMPRESSION: Non-contrast head CT: 1. 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 2. No acute intracranial hemorrhage or interval acute demarcated cortical infarct. 3. Background advanced chronic small vessel ischemic disease. 4. Left sphenoid sinusitis. CTA neck: 1. The common carotid, internal carotid and vertebral arteries are patent within the neck without stenosis. Minimal atherosclerotic plaque within the proximal right ICA. Non-stenotic atherosclerotic plaque at the right vertebral artery origin. 2. Aortic Atherosclerosis (ICD10-I70.0). CTA head: 1. No emergent large vessel occlusion identified. 2. Non-stenotic atherosclerotic plaque within the intracranial internal carotid arteries. CT perfusion head: The perfusion software identifies no core infarct. The perfusion software identifies no critically hypoperfused parenchyma (utilizing the Tmax>6 seconds threshold). No mismatch volume reported. Electronically Signed   By: Rockey Childs D.O.   On: 09/15/2023 09:07   CT ANGIO HEAD NECK W WO CM W PERF (CODE STROKE) Result Date: 09/15/2023 CLINICAL DATA:  Provided history: Neuro deficit, acute, stroke suspected. EXAM: CT ANGIOGRAPHY HEAD AND NECK CT PERFUSION BRAIN TECHNIQUE: Multidetector CT imaging of the head  and neck was performed using the standard protocol during bolus administration of intravenous contrast. Multiplanar CT image reconstructions and MIPs were obtained  to evaluate the vascular anatomy. Carotid stenosis measurements (when applicable) are obtained utilizing NASCET criteria, using the distal internal carotid diameter as the denominator. Multiphase CT imaging of the brain was performed following IV bolus contrast injection. Subsequent parametric perfusion maps were calculated using RAPID software. RADIATION DOSE REDUCTION: This exam was performed according to the departmental dose-optimization program which includes automated exposure control, adjustment of the mA and/or kV according to patient size and/or use of iterative reconstruction technique. CONTRAST:  Administered contrast not known at this time. COMPARISON:  Noncontrast head CT 09/08/2023. CT angiogram head/neck 09/07/2023. Brain MRI 09/07/2023. FINDINGS: CT HEAD FINDINGS Brain: No age advanced or lobar predominant parenchymal atrophy. Known small acute/subacute cortical/subcortical infarcts within the left parietal and left occipital lobes. Chronic cortical/subcortical infarct within the right occipital lobe. Chronic lacunar infarct within the left parietal white matter. Background advanced patchy and ill-defined hypoattenuation within the cerebral white matter, nonspecific but compatible chronic small vessel disease. Small chronic infarcts within the bilateral cerebellar hemispheres. No interval acute demarcated cortical infarct is identified. No acute intracranial hemorrhage. No evidence of an intracranial mass. No chronic intracranial blood products. No extra-axial fluid collection. No midline shift. Vascular: No hyperdense vessel. Atherosclerotic calcifications. Skull: No calvarial fracture or aggressive osseous lesion. Sinuses/Orbits: No orbital mass or acute orbital finding. Small volume frothy secretions within the left sphenoid sinus.  ASPECTS ([place] Stroke Program Early CT Score) - Ganglionic level infarction (caudate, lentiform nuclei, internal capsule, insula, M1-M3 cortex): 7 - Supraganglionic infarction (M4-M6 cortex): 3 Total score (0-10 with 10 being normal): 10 (when discounting the previously demonstrated, known acute/subacute cortically-based infarcts described above). No evidence of interval acute intracranial abnormality. These results were communicated to Dr. Arora At 8:51 amon 1/7/2025by text page via the [facility] messaging system. Review of the MIP images confirms the above findings CTA NECK FINDINGS Aortic arch: Standard aortic branching. Atherosclerotic plaque within the visualized thoracic aorta and proximal major branch vessels of the neck. Streak/beam hardening artifact arising from a dense left-sided contrast bolus partially obscures the left subclavian artery. Within this limitation, there is no appreciable hemodynamically significant innominate or proximal subclavian artery stenosis. Right carotid system: CCA and ICA patent within the neck without stenosis. Minimal atherosclerotic plaque within the proximal ICA. Partially retropharyngeal course of the cervical ICA. Left carotid system: CCA and ICA patent within the neck without stenosis or significant atherosclerotic disease. Vertebral arteries: Codominant and patent within the neck without stenosis. Nonstenotic atherosclerotic plaque at the right vertebral artery origin. Skeleton: Cervical spondylosis. No acute fracture or aggressive osseous lesion. Other neck: No neck mass or cervical lymphadenopathy. Subcentimeter thyroid  nodules not meeting consensus criteria for ultrasound follow-up based on size. No follow-up imaging recommended. Reference: J Am Coll Radiol. 2015 Feb;12(2): 143-50. Upper chest: No consolidation within the imaged lung apices. Review of the MIP images confirms the above findings CTA HEAD FINDINGS Anterior circulation: The intracranial internal carotid  arteries are patent. Nonstenotic atherosclerotic plaque within both vessels The M1 middle cerebral arteries are patent. No M2 proximal branch occlusion or high-grade proximal stenosis. The anterior cerebral arteries are patent. No intracranial aneurysm is identified. Posterior circulation: The intracranial vertebral arteries are patent. The basilar artery is patent. The posterior cerebral arteries are patent. Hypoplastic right P1 segment with sizable right posterior communicating artery. The left posterior communicating artery is diminutive or absent. Venous sinuses: Within the limitations of contrast timing, no convincing thrombus. Anatomic variants: As described. Review of the MIP images confirms the above findings No emergent large vessel  occlusion identified. These results were communicated to Dr. Voncile at 9:04 amon 1/7/2025by text page via the [facility] messaging system. CT Brain Perfusion Findings: CBF (<30%) Volume: 0mL Perfusion (Tmax>6.0s) volume: 0mL Mismatch Volume: 0mL Infarction Location:None identified via perfusion data. IMPRESSION: Non-contrast head CT: 1. 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 2. No acute intracranial hemorrhage or interval acute demarcated cortical infarct. 3. Background advanced chronic small vessel ischemic disease. 4. Left sphenoid sinusitis. CTA neck: 1. The common carotid, internal carotid and vertebral arteries are patent within the neck without stenosis. Minimal atherosclerotic plaque within the proximal right ICA. Non-stenotic atherosclerotic plaque at the right vertebral artery origin. 2. Aortic Atherosclerosis (ICD10-I70.0). CTA head: 1. No emergent large vessel occlusion identified. 2. Non-stenotic atherosclerotic plaque within the intracranial internal carotid arteries. CT perfusion head: The perfusion software identifies no core infarct. The perfusion software identifies no critically hypoperfused parenchyma  (utilizing the Tmax>6 seconds threshold). No mismatch volume reported. Electronically Signed   By: Rockey Childs D.O.   On: 09/15/2023 09:07   CT HEAD WO CONTRAST ( ) Result Date: 09/08/2023 CLINICAL DATA:  Stroke follow-up after teen K. EXAM: CT HEAD WITHOUT CONTRAST TECHNIQUE: Contiguous axial images were obtained from the base of the skull through the vertex without intravenous contrast. RADIATION DOSE REDUCTION: This exam was performed according to the departmental dose-optimization program which includes automated exposure control, adjustment of the mA and/or kV according to patient size and/or use of iterative reconstruction technique. COMPARISON:  Brain MRI from yesterday FINDINGS: Brain: Acute infarct in the left occipital lobe is largely occult. There is an area of possible new cytotoxic edema in the left parietal lobe. Chronic infarcts in the right occipital cortex and bilateral cerebellum. No evidence of new infarct or hemorrhagic conversion. There is chronic small vessel ischemia gliosis which is confluent in the cerebral white matter. No hydrocephalus or masslike finding. Vascular: No hyperdense vessel. Skull: No acute finding Sinuses/Orbits: Sclerosis around the left sphenoid sinus attributed to chronic inflammation. IMPRESSION: 1. Suspect small interval infarct in the left parietal cortex. Known recent infarct in the left occipital white matter. No acute hemorrhage after TNK. 2. Advanced chronic ischemic injury. Electronically Signed   By: Dorn Roulette M.D.   On: 09/08/2023 10:00   ECHOCARDIOGRAM COMPLETE Result Date: 09/07/2023    ECHOCARDIOGRAM REPORT   Patient Name:   Charles Frost Date of Exam: 09/07/2023 Medical Rec #:  [id number]      Height:       68.0 in Accession #:    [id number]     Weight:       177.2 lb Date of Birth:  1945/12/07     BSA:          1.941 m Patient Age:    77 years       BP:           162/91 mmHg Patient Gender: M              HR:           77 bpm. Exam  Location:  Inpatient Procedure: 2D Echo, Cardiac Doppler, Color Doppler and Intracardiac            Opacification Agent Indications:    Stroke  History:        Patient has no prior history of Echocardiogram examinations.                 Risk Factors:Hypertension.  Sonographer:  Ellouise Mose RDCS Referring Phys: [id number] [facility] IMPRESSIONS  1. Left ventricular ejection fraction, by estimation, is 25 to 30%. Left ventricular ejection fraction by PLAX is 34 %. The left ventricle has severely decreased function. The left ventricle demonstrates global hypokinesis. There is mild concentric left  ventricular hypertrophy. Left ventricular diastolic parameters are consistent with Grade I diastolic dysfunction (impaired relaxation).  2. Right ventricular systolic function is normal. The right ventricular size is normal. There is normal pulmonary artery systolic pressure.  3. The mitral valve is normal in structure. Trivial mitral valve regurgitation. No evidence of mitral stenosis.  4. The aortic valve is tricuspid. There is mild calcification of the aortic valve. There is mild thickening of the aortic valve. Aortic valve regurgitation is not visualized. No aortic stenosis is present.  5. The inferior vena cava is normal in size with greater than 50% respiratory variability, suggesting right atrial pressure of 3 mmHg. FINDINGS  Left Ventricle: Left ventricular ejection fraction, by estimation, is 25 to 30%. Left ventricular ejection fraction by PLAX is 34 %. The left ventricle has severely decreased function. The left ventricle demonstrates global hypokinesis. Definity  contrast agent was given IV to delineate the left ventricular endocardial borders. The left ventricular internal cavity size was normal in size. There is mild concentric left ventricular hypertrophy. Left ventricular diastolic parameters are consistent with Grade I diastolic dysfunction (impaired relaxation). Indeterminate filling pressures. Right  Ventricle: The right ventricular size is normal. No increase in right ventricular wall thickness. Right ventricular systolic function is normal. There is normal pulmonary artery systolic pressure. The tricuspid regurgitant velocity is 1.77 m/s, and  with an assumed right atrial pressure of 3 mmHg, the estimated right ventricular systolic pressure is 15.5 mmHg. Left Atrium: Left atrial size was normal in size. Right Atrium: Right atrial size was normal in size. Pericardium: There is no evidence of pericardial effusion. Mitral Valve: The mitral valve is normal in structure. Trivial mitral valve regurgitation. No evidence of mitral valve stenosis. Tricuspid Valve: The tricuspid valve is normal in structure. Tricuspid valve regurgitation is trivial. No evidence of tricuspid stenosis. Aortic Valve: The aortic valve is tricuspid. There is mild calcification of the aortic valve. There is mild thickening of the aortic valve. Aortic valve regurgitation is not visualized. No aortic stenosis is present. Pulmonic Valve: The pulmonic valve was normal in structure. Pulmonic valve regurgitation is not visualized. No evidence of pulmonic stenosis. Aorta: The aortic root is normal in size and structure. Venous: The inferior vena cava is normal in size with greater than 50% respiratory variability, suggesting right atrial pressure of 3 mmHg. IAS/Shunts: No atrial level shunt detected by color flow Doppler.  LEFT VENTRICLE PLAX 2D LV EF:         Left            Diastology                ventricular     LV e' medial:    4.46 cm/s                ejection        LV E/e' medial:  9.9                fraction by     LV e' lateral:   5.66 cm/s                PLAX is 34      LV E/e' lateral: 7.8                %.  LVIDd:         4.30 cm LVIDs:         3.60 cm LV PW:         1.10 cm LV IVS:        1.20 cm LVOT diam:     2.60 cm LV SV:         74 LV SV Index:   38 LVOT Area:     5.31 cm  LV Volumes (MOD) LV vol d, MOD    57.7 ml A2C: LV vol d,  MOD    84.6 ml A4C: LV vol s, MOD    37.3 ml A2C: LV vol s, MOD    57.3 ml A4C: LV SV MOD A2C:   20.4 ml LV SV MOD A4C:   84.6 ml LV SV MOD BP:    24.3 ml RIGHT VENTRICLE            IVC RV S prime:     5.33 cm/s  IVC diam: 1.90 cm TAPSE (M-mode): 1.3 cm LEFT ATRIUM             Index        RIGHT ATRIUM           Index LA diam:        3.50 cm 1.80 cm/m   RA Area:     11.70 cm LA Vol (A2C):   34.8 ml 17.92 ml/m  RA Volume:   20.70 ml  10.66 ml/m LA Vol (A4C):   39.7 ml 20.45 ml/m LA Biplane Vol: 39.9 ml 20.55 ml/m  AORTIC VALVE LVOT Vmax:   84.40 cm/s LVOT Vmean:  50.800 cm/s LVOT VTI:    0.139 m  AORTA Ao Root diam: 3.00 cm Ao Asc diam:  3.80 cm MITRAL VALVE               TRICUSPID VALVE MV Area (PHT): 4.39 cm    TR Peak grad:   12.5 mmHg MV Decel Time: 173 msec    TR Vmax:        177.00 cm/s MV E velocity: 44.00 cm/s MV A velocity: 92.80 cm/s  SHUNTS MV E/A ratio:  0.47        Systemic VTI:  0.14 m                            Systemic Diam: 2.60 cm Annabella Scarce MD Electronically signed by Annabella Scarce MD Signature Date/Time: 09/07/2023/6:02:19 PM    Final    CT ANGIO HEAD NECK W WO CM (CODE STROKE) Result Date: 09/07/2023 CLINICAL DATA:  Neuro deficit, acute, stroke suspected EXAM: CT ANGIOGRAPHY HEAD AND NECK WITH AND WITHOUT CONTRAST TECHNIQUE: Multidetector CT imaging of the head and neck was performed using the standard protocol during bolus administration of intravenous contrast. Multiplanar CT image reconstructions and MIPs were obtained to evaluate the vascular anatomy. Carotid stenosis measurements (when applicable) are obtained utilizing NASCET criteria, using the distal internal carotid diameter as the denominator. RADIATION DOSE REDUCTION: This exam was performed according to the departmental dose-optimization program which includes automated exposure control, adjustment of the mA and/or kV according to patient size and/or use of iterative reconstruction technique. CONTRAST:  75mL  OMNIPAQUE  IOHEXOL  350 MG/ML SOLN COMPARISON:  Same day CT head and MRI head. FINDINGS: CTA NECK FINDINGS Aortic arch: Great vessel origins are patent. Aortic atherosclerosis. Right carotid system: No evidence of dissection, stenosis (50% or greater), or occlusion.  Left carotid system: No evidence of dissection, stenosis (50% or greater), or occlusion. Vertebral arteries: Left dominant. Suspected moderate stenosis of the right vertebral artery origin, although streak artifact limits assessment. Left vertebral artery is patent without significant stenosis. Skeleton: No acute abnormality on limited assessment. Severe lower cervical degenerative disc disease. Other neck: No acute abnormality on limited assessment. Upper chest: Visualized lung apices are clear. Review of the MIP images confirms the above findings CTA HEAD FINDINGS Anterior circulation: Bilateral intracranial ICAs, MCAs, and ACAs are patent without proximal hemodynamically significant stenosis. Posterior circulation: Bilateral intradural vertebral arteries, basilar artery, and bilateral posterior cerebral arteries are patent without proximal hemodynamically significant stenosis. Right fetal type PCA, anatomic variant. Venous sinuses: As permitted by contrast timing, patent. Anatomic variants: Detailed above. Review of the MIP images confirms the above findings IMPRESSION: 1. No emergent large vessel occlusion. 2. Suspected moderate stenosis of the right vertebral artery origin, although streak artifact limits assessment. 3.  Aortic Atherosclerosis (ICD10-I70.0). Electronically Signed   By: Gilmore GORMAN Molt M.D.   On: 09/07/2023 12:10   MR BRAIN WO CONTRAST Result Date: 09/07/2023 CLINICAL DATA:  Neuro deficit, acute, stroke suspected EXAM: MRI HEAD WITHOUT CONTRAST TECHNIQUE: Multiplanar, multiecho pulse sequences of the brain and surrounding structures were obtained without intravenous contrast. COMPARISON:  Same day CT head. FINDINGS: Brain: Small  acute infarct in the left periventricular occipital lobe. Slight edema without mass effect. Remote right occipital and bilateral cerebellar infarcts. Moderate to advanced patchy T2/FLAIR hyperintensity white matter, nonspecific but compatible with chronic microvascular ischemic disease. No evidence of acute hemorrhage, mass lesion, midline shift or hydrocephalus. Vascular: Please see forthcoming CTA. Skull and upper cervical spine: Normal marrow signal. Sinuses/Orbits: Mild paranasal sinus mucosal thickening. IMPRESSION: 1. Small acute infarct in the left periventricular occipital lobe. 2. Remote right occipital and bilateral cerebellar infarcts and moderate to severe chronic microvascular ischemic disease. Findings discussed with Dr. VANESSA via telephone at 11:50 a.m. Electronically Signed   By: Gilmore GORMAN Molt M.D.   On: 09/07/2023 12:00   CT HEAD CODE STROKE WO CONTRAST Result Date: 09/07/2023 CLINICAL DATA:  Code stroke.  Neuro deficit, acute, stroke suspected EXAM: CT HEAD WITHOUT CONTRAST TECHNIQUE: Contiguous axial images were obtained from the base of the skull through the vertex without intravenous contrast. RADIATION DOSE REDUCTION: This exam was performed according to the departmental dose-optimization program which includes automated exposure control, adjustment of the mA and/or kV according to patient size and/or use of iterative reconstruction technique. COMPARISON:  None Available. FINDINGS: Brain: Remote right PCA territory infarct. Remote right cerebellar infarct. Advanced patchy and confluent white matter hypodensities are nonspecific but compatible with chronic microvascular ischemic disease. No evidence of acute large vascular territory infarct, acute hemorrhage, mass lesion, midline shift or hydrocephalus. Vascular: No hyperdense vessel identified. Calcific atherosclerosis. Skull: No acute fracture. Sinuses/Orbits: Mild paranasal sinus mucosal thickening. No acute orbital findings.  Other: No mastoid effusions. ASPECTS [facility] Stroke Program Early CT Score) Total score (0-10 with 10 being normal): 10. IMPRESSION: 1. No evidence of acute large vascular territory infarct or acute hemorrhage. ASPECTS is 10. 2. Remote right PCA territory and remote right cerebellar infarcts with advanced chronic microvascular ischemic change. An MRI could better assess for acute infarct if clinically warranted. Electronically Signed   By: Gilmore GORMAN Molt M.D.   On: 09/07/2023 11:16     PHYSICAL EXAM  Temp:  [97.6 F (36.4 C)-98.3 F (36.8 C)] 98 F (36.7 C) (01/09 1109) Pulse Rate:  [74-79] 79 (01/09 1109)  Resp:  [17-18] 17 (01/09 1109) BP: (124-139)/(74-86) 139/86 (01/09 1109) SpO2:  [96 %-98 %] 98 % (01/09 1109)  General - Well nourished, well developed, in no apparent distress.  Ophthalmologic - fundi not visualized due to noncooperation.  Cardiovascular - Regular rhythm and rate.  Mental Status -  Level of arousal and orientation to time, place, and person were intact. Language including expression, naming, repetition, comprehension was assessed and found intact. Fund of Knowledge was assessed and was intact.  Cranial Nerves II - XII - II - Visual field intact OU, decreased visual acuity on the left upper quadrant visual field. III, IV, VI - Extraocular movements intact. V - Facial sensation intact bilaterally. VII - Facial movement intact bilaterally. VIII - Hearing & vestibular intact bilaterally. X - Palate elevates symmetrically. XI - Chin turning & shoulder shrug intact bilaterally. XII - Tongue protrusion intact.  Motor Strength - The patient's strength was normal in all extremities and pronator drift was absent.  Bulk was normal and fasciculations were absent.   Motor Tone - Muscle tone was assessed at the neck and appendages and was normal.  Reflexes - The patient's reflexes were symmetrical in all extremities and he had no pathological reflexes.  Sensory -  Light touch, temperature/pinprick were assessed and were symmetrical.    Coordination - The patient had normal movements in the hands and feet with no obvious ataxia or dysmetria.  Tremor was absent.  Gait and Station - deferred.   ASSESSMENT/PLAN [name] is a 78 y.o. male with history of hypertension, rheumatoid arthritis, prostate cancer status post TURP and radiotherapy, hyper lipidemia, diabetes admitted for nausea, right side leaning, dizziness and worsening slurred speech. No TNK given due to recent stroke.    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 Newly seen foci of acute cortical infarction in the posterior left parietal lobe. Echo EF 25 to 30% on 09/07/2023 TEE today showed EF 25 to 30% with global hypokinesis, secundum ASD noted with bidirectional flow  LE venous Doppler no DVT Eliquis  for VTE prophylaxis aspirin  81 mg daily and clopidogrel  75 mg daily prior to admission, now on Eliquis  (apixaban ) daily given stroke and cardiomyopathy with low EF.  Repeat echo in 2-3 months, once EF > 35% may consider switch eliquis  to antipletelet if no afib found Patient counseled to be compliant with his antithrombotic medications Ongoing aggressive stroke risk factor management Therapy recommendations: None Disposition: Pending  History of stroke MRI showed old right occipital and bilateral cerebellar infarcts on MRI in the past 08/2023 admitted for right-sided weakness, aphasia and slurred speech.  CT no acute abnormality.  Status post TNK.  CT head and neck showed right VA moderate stenosis.  MRI showed left occipital white matter small infarct.  EF 25 to 30%.  LDL 105, A1c 6.4.  Discharged on DAPT and Crestor  20.  Recommend outpatient cardiology follow-up with Dr.  Ladona.  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 Ladona on board Agree with Eliquis  given stroke and low EF cardiomyopathy  ASD/PFO TEE showed secundum ASD noted with bidirectional flow LE venous Doppler no DVT Given advanced age, on anticoagulation, no DVT, alternative cause of stroke at this time, do not recommend  PFO/ASD closure at this time.  Diabetes HgbA1c 6.4 goal < 7.0, Controlled CBG monitoring SSI DM education and close PCP follow up  Hypertension Stable Avoid low BP Long term BP goal normotensive  Hyperlipidemia Home meds: Crestor  20 LDL 105 in 08/2023, goal < 70 Now on Crestor  20 Continue statin at discharge  Other Stroke Risk Factors Advanced age  Other Active Problems Prostate cancer status post TURP and radiotherapy Rheumatoid arthritis  Hospital day # 2  Neurology will sign off. Please call with questions. Pt will follow up with stroke clinic Dr. Rosemarie at [facility] in about 4 weeks. Thanks for the consult.   Ary Cummins, MD PhD Stroke Neurology 09/17/2023 5:08 PM    To contact Stroke Continuity provider, please refer to [email or web address]. After hours, contact General Neurology

## 2023-09-17 NOTE — Progress Notes (Signed)
 2 week cardiac monitor for stroke, Dr. Jacinto Halim

## 2023-09-17 NOTE — Progress Notes (Signed)
 Pt understand DC Instructions. Additional DC instructions printed for son. Pt wheeled off unit by this RN.

## 2023-09-18 DIAGNOSIS — N399 Disorder of urinary system, unspecified: Secondary | ICD-10-CM | POA: Diagnosis not present

## 2023-09-18 DIAGNOSIS — E7800 Pure hypercholesterolemia, unspecified: Secondary | ICD-10-CM | POA: Diagnosis not present

## 2023-09-18 DIAGNOSIS — I5040 Unspecified combined systolic (congestive) and diastolic (congestive) heart failure: Secondary | ICD-10-CM | POA: Diagnosis not present

## 2023-09-18 DIAGNOSIS — I420 Dilated cardiomyopathy: Secondary | ICD-10-CM | POA: Diagnosis not present

## 2023-09-18 DIAGNOSIS — Q2111 Secundum atrial septal defect: Secondary | ICD-10-CM | POA: Diagnosis not present

## 2023-09-18 DIAGNOSIS — I10 Essential (primary) hypertension: Secondary | ICD-10-CM | POA: Diagnosis not present

## 2023-09-18 DIAGNOSIS — I639 Cerebral infarction, unspecified: Secondary | ICD-10-CM | POA: Diagnosis not present

## 2023-09-23 ENCOUNTER — Ambulatory Visit: Payer: Medicare HMO | Attending: Nurse Practitioner

## 2023-09-23 ENCOUNTER — Ambulatory Visit: Payer: Medicare HMO | Admitting: Occupational Therapy

## 2023-09-23 ENCOUNTER — Encounter: Payer: Self-pay | Admitting: Occupational Therapy

## 2023-09-23 VITALS — BP 128/78 | HR 90

## 2023-09-23 DIAGNOSIS — I639 Cerebral infarction, unspecified: Secondary | ICD-10-CM | POA: Insufficient documentation

## 2023-09-23 DIAGNOSIS — R29818 Other symptoms and signs involving the nervous system: Secondary | ICD-10-CM

## 2023-09-23 DIAGNOSIS — R41842 Visuospatial deficit: Secondary | ICD-10-CM | POA: Diagnosis not present

## 2023-09-23 DIAGNOSIS — M6281 Muscle weakness (generalized): Secondary | ICD-10-CM | POA: Insufficient documentation

## 2023-09-23 DIAGNOSIS — R293 Abnormal posture: Secondary | ICD-10-CM | POA: Diagnosis not present

## 2023-09-23 DIAGNOSIS — R2681 Unsteadiness on feet: Secondary | ICD-10-CM | POA: Diagnosis not present

## 2023-09-23 DIAGNOSIS — R262 Difficulty in walking, not elsewhere classified: Secondary | ICD-10-CM | POA: Diagnosis not present

## 2023-09-23 NOTE — Therapy (Signed)
 OUTPATIENT PHYSICAL THERAPY NEURO EVALUATION   Patient Name: Charles Frost MRN: [id number] DOB:1946/04/04, 78 y.o., male Today's Date: 09/23/2023   PCP: Nicholas Bari, FNP REFERRING PROVIDER: McDiarmid, Demetra Filter, MD   END OF SESSION:  PT End of Session - 09/23/23 1020     Visit Number 1    Number of Visits 1    Authorization Type [employer] medicare    PT Start Time 1019   NOT CHECKED IN BY FRONT DESK   PT Stop Time 1047    PT Time Calculation (min) 28 min    Equipment Utilized During Treatment Gait belt    Activity Tolerance Patient tolerated treatment well    Behavior During Therapy WFL for tasks assessed/performed             Past Medical History:  Diagnosis Date   GERD (gastroesophageal reflux disease)    Hypercholesterolemia    Hypertension    Prostate cancer (HCC)    Rheumatoid arthritis (HCC)    RA   Past Surgical History:  Procedure Laterality Date   cysto uretero remove stone  2009   cystoscopy insert stent  2009   PROSTATE BIOPSY     TRANSESOPHAGEAL ECHOCARDIOGRAM (CATH LAB) N/A 09/16/2023   Procedure: TRANSESOPHAGEAL ECHOCARDIOGRAM;  Surgeon: Sheryle Donning, MD;  Location: [facility] INVASIVE CV LAB;  Service: Cardiovascular;  Laterality: N/A;   ulnar nerve release Right 2008   Patient Active Problem List   Diagnosis Date Noted   Chronic heart failure with reduced ejection fraction (HFrEF, <= 40%) (HCC) 09/16/2023   Hypercholesteremia 09/16/2023   Stroke (HCC) 09/15/2023   HFrEF (heart failure with reduced ejection fraction) (HCC) 09/15/2023   Acute ischemic left PCA stroke (HCC) 09/07/2023   UTI (urinary tract infection) 12/08/2016   Bladder pain 12/07/2016   Anemia 12/07/2016   Hyperglycemia 12/07/2016   Hypertension 12/07/2016   Malignant neoplasm of prostate (HCC) 08/28/2016    ONSET DATE: 09/16/23 referral  REFERRING DIAG: I63.9 (ICD-10-CM) - Cerebrovascular accident (CVA), unspecified mechanism (HCC)   THERAPY DIAG:  Abnormal posture -  Plan: PT plan of care cert/re-cert  Difficulty in walking, not elsewhere classified - Plan: PT plan of care cert/re-cert  Muscle weakness (generalized) - Plan: PT plan of care cert/re-cert  Unsteadiness on feet - Plan: PT plan of care cert/re-cert  Rationale for Evaluation and Treatment: Rehabilitation  SUBJECTIVE:                                                                                                                                                                                             SUBJECTIVE STATEMENT: Patient arrives to clinic with  family member who stayed in lobby. Carrying SPC, but not necessarily using it. Patient reports that he has been walking in the street in front of his house. Vision deficits are also clearing up, per patient report. Was using the [facility] off and on prior to the CVA as well.  Pt accompanied by: family member  PERTINENT HISTORY: arthritis, prostate CA, GERD, HLD, HTN, RA  PAIN:  Are you having pain? No  PRECAUTIONS: Fall   WEIGHT BEARING RESTRICTIONS: No  FALLS: Has patient fallen in last 6 months? No  LIVING ENVIRONMENT: Lives with: lives with their spouse Lives in: House/apartment Stairs: Yes: Internal: 2 steps; on right going up and External: 2 steps; on right going up Has following equipment at home: Single point cane and shower chair  PLOF: Independent and Requires assistive device for independence  PATIENT GOALS: "I don't know"  OBJECTIVE:  Note: Objective measures were completed at Evaluation unless otherwise noted.  DIAGNOSTIC FINDINGS:  09/15/23 brain MRI IMPRESSION: 1. Newly seen cluster of small acute infarctions affecting the cortical brain in the left occipital region. Previously seen acute infarction just anterior to that does not show any progression. Newly seen foci of acute cortical infarction in the posterior left parietal lobe. Findings consistent with recurrent embolic infarctions. 2. Advanced chronic  small-vessel ischemic changes elsewhere in the brain as outlined above.  COGNITION: Overall cognitive status: Within functional limits for tasks assessed   SENSATION: WFL  COORDINATION: WFL B LE heel shin/ figure 8   POSTURE: No Significant postural limitations   LOWER EXTREMITY MMT:    MMT Right Eval Left Eval  Hip flexion 4+ 4+  Hip extension    Hip abduction 4+ 4+  Hip adduction 4+ 4+  Hip internal rotation    Hip external rotation    Knee flexion 4+ 4+  Knee extension 4+ 4+  Ankle dorsiflexion 4+ 4+  Ankle plantarflexion    Ankle inversion    Ankle eversion    (Blank rows = not tested)  BED MOBILITY:  independent  TRANSFERS: Assistive device utilized: Single point cane and None  Sit to stand: Complete Independence and Modified independence Stand to sit: Complete Independence and Modified independence Chair to chair: Complete Independence and Modified independence   GAIT: Gait pattern: step through pattern and wide BOS Distance walked: clinic Assistive device utilized: None Level of assistance: Complete Independence   FUNCTIONAL TESTS:   [facility] PT Assessment - 09/23/23 0001       Standardized Balance Assessment   Standardized Balance Assessment Timed Up and Go Test    Five times sit to stand comments  12.5    10 Meter Walk .70m/s      Timed Up and Go Test   Normal TUG (seconds) 12.6      Functional Gait  Assessment   Gait assessed  Yes    Gait Level Surface Walks 20 ft in less than 5.5 sec, no assistive devices, good speed, no evidence for imbalance, normal gait pattern, deviates no more than 6 in outside of the 12 in walkway width.    Change in Gait Speed Able to smoothly change walking speed without loss of balance or gait deviation. Deviate no more than 6 in outside of the 12 in walkway width.    Gait with Horizontal Head Turns Performs head turns smoothly with slight change in gait velocity (eg, minor disruption to smooth gait path), deviates  6-10 in outside 12 in walkway width, or uses an assistive device.  Gait with Vertical Head Turns Performs task with slight change in gait velocity (eg, minor disruption to smooth gait path), deviates 6 - 10 in outside 12 in walkway width or uses assistive device    Gait and Pivot Turn Pivot turns safely within 3 sec and stops quickly with no loss of balance.    Step Over Obstacle Is able to step over one shoe box (4.5 in total height) without changing gait speed. No evidence of imbalance.    Gait with Narrow Base of Support Ambulates 4-7 steps.    Gait with Eyes Closed Walks 20 ft, uses assistive device, slower speed, mild gait deviations, deviates 6-10 in outside 12 in walkway width. Ambulates 20 ft in less than 9 sec but greater than 7 sec.    Ambulating Backwards Walks 20 ft, uses assistive device, slower speed, mild gait deviations, deviates 6-10 in outside 12 in walkway width.    Steps Alternating feet, must use rail.    Total Score 22              PATIENT SURVEYS:  FOTO 64, expected to be at 75                                                                                                                              TREATMENT N/a eval   PATIENT EDUCATION: Education details: PT POC, exam findings Person educated: Patient Education method: Explanation Education comprehension: verbalized understanding  GOALS: Not indicated as patient does not require/want skilled PT services at this time  ASSESSMENT:  CLINICAL IMPRESSION: Patient is a 78 y.o. male who was seen today for physical therapy evaluation and treatment for mobility impairments related to a recent CVA. Patient reports being at his mobility baseline at this time. Patient completed the Timed Up and Go test (TUG) in 12.6 seconds.  Geriatrics: need for further assessment of fall risk: >/= 12 sec; Recurrent falls: > 15 sec; Vestibular Disorders fall risk: > 15 sec; Parkinson's Disease fall risk: > 16 sec ([email or web address],  2023). 10 Meter Walk Test: Patient instructed to walk 10 meters (32.8 ft) as quickly and as safely as possible at their normal speed x2 and at a fast speed x2. Time measured from 2 meter mark to 8 meter mark to accommodate ramp-up and ramp-down.  Normal speed: .44m/s Cut off scores: <0.4 m/s = household Ambulator, 0.4-0.8 m/s = limited community Ambulator, >0.8 m/s = community Ambulator, >1.2 m/s = crossing a street, <1.0 = increased fall risk MCID 0.05 m/s (small), 0.13 m/s (moderate), 0.06 m/s (significant)  (ANPTA Core Set of Outcome Measures for Adults with Neurologic Conditions, 2018). Five times Sit to Stand Test (FTSS) Method: Use a straight back chair with a solid seat that is 17-18" high. Ask participant to sit on the chair with arms folded across their chest.   Instructions: "Stand up and sit down as quickly as possible 5 times, keeping your arms folded across your chest."  Measurement: Stop timing when the participant touches the chair in sitting the 5th time.  TIME: 12.5 sec  Cut off scores indicative of increased fall risk: >12 sec CVA, >16 sec PD, >13 sec vestibular (ANPTA Core Set of Outcome Measures for Adults with Neurologic Conditions, 2018). Patient scored a 22/30 on  Functional Gait Assessment.   <22/30 = predictive of falls, <20/30 = fall in 6 months, <18/30 = predictive of falls in PD MCID: 5 points stroke population, 4 points geriatric population (ANPTA Core Set of Outcome Measures for Adults with Neurologic Conditions, 2018). Patient not requiring nor does he wish to participate in skilled PT services at this time.   CLINICAL DECISION MAKING: Stable/uncomplicated  EVALUATION COMPLEXITY: Low  PLAN:  PT FREQUENCY: one time visit  PT DURATION: other: 1x visit   Rebecca Campus, PT Rebecca Campus, PT, DPT, CBIS  09/23/2023, 11:47 AM

## 2023-09-23 NOTE — Patient Instructions (Signed)
 Visual Strategies  1. Look for the edge of objects (to the left and/or right) so that you make sure you are seeing all of an object  2. Turn your head when walking, scan from side to side, particularly in busy environments  3. Use an organized scanning pattern. It's usually easier to scan from top to bottom, and left to right (like you are reading)  4. Double check yourself  5. Use a line guide (like a blank piece of paper) or your finger when reading  6. If necessary, place brightly colored tape at end of table or work area as a reminder to always look until you see the tape.     Activities to try at home to encourage visual scanning:   1. Word searches 2. Mazes 3. Puzzles 4. Card games 5. Computer games and/or searches 6. Connect-the-dots  Activities for environmental (larger) scanning:   1. With supervision, scan for items in grocery store or drugstore.  Begin with a familiar store, then progress to a new store you've never been in before. Make sure you have supervision with this.   2. With supervision, tell a family member or caregiver when it is safe to cross a street after looking all directions and any side streets. However, do NOT cross street unless family member or caregiver is with you and says it is OK

## 2023-09-23 NOTE — Therapy (Signed)
OUTPATIENT OCCUPATIONAL THERAPY NEURO EVALUATION  Patient Name: Charles Frost MRN: [id number] DOB:Jan 19, 1946, 78 y.o., male Today's Date: 09/23/2023  PCP: Charles Parents, FNP  REFERRING PROVIDER: McDiarmid, Leighton Roach, MD  END OF SESSION:  OT End of Session - 09/23/23 1111     Visit Number 1    Authorization Type [employer]    OT Start Time 1108    Activity Tolerance Patient tolerated treatment well    Behavior During Therapy [facility] for tasks assessed/performed             Past Medical History:  Diagnosis Date   GERD (gastroesophageal reflux disease)    Hypercholesterolemia    Hypertension    Prostate cancer (HCC)    Rheumatoid arthritis (HCC)    RA   Past Surgical History:  Procedure Laterality Date   cysto uretero remove stone  2009   cystoscopy insert stent  2009   PROSTATE BIOPSY     TRANSESOPHAGEAL ECHOCARDIOGRAM (CATH LAB) N/A 09/16/2023   Procedure: TRANSESOPHAGEAL ECHOCARDIOGRAM;  Surgeon: Charles Red, MD;  Location: [facility] INVASIVE CV LAB;  Service: Cardiovascular;  Laterality: N/A;   ulnar nerve release Right 2008   Patient Active Problem List   Diagnosis Date Noted   Chronic heart failure with reduced ejection fraction (HFrEF, <= 40%) (HCC) 09/16/2023   Hypercholesteremia 09/16/2023   Stroke (HCC) 09/15/2023   HFrEF (heart failure with reduced ejection fraction) (HCC) 09/15/2023   Acute ischemic left PCA stroke (HCC) 09/07/2023   UTI (urinary tract infection) 12/08/2016   Bladder pain 12/07/2016   Anemia 12/07/2016   Hyperglycemia 12/07/2016   Hypertension 12/07/2016   Malignant neoplasm of prostate (HCC) 08/28/2016    ONSET DATE: 09/16/2023 (Date of referral); 09/07/2023. (date of most recent stroke)  REFERRING DIAG: I63.9 (ICD-10-CM) - Cerebrovascular accident (CVA), unspecified mechanism  THERAPY DIAG:  Visuospatial deficit  Rationale for Evaluation and Treatment: Rehabilitation  SUBJECTIVE:   SUBJECTIVE STATEMENT: He feels  like his vision has improved since his initial stroke.  Pt accompanied by: self  PERTINENT HISTORY: PMH of HTN and prostate cancer  Patient is a 78 yo male presenting to the ED with dysarthria, dizziness, and R sided weakness on 09/15/23. MRI showing new small acute infarcts in L occipital region and cortical infarction in the posterior left parietal lobe. Previous occipital infarct did not progress.    PRECAUTIONS: Fall and Other: Left visual field cut  WEIGHT BEARING RESTRICTIONS: No  PAIN:  Are you having pain? No  FALLS: Has patient fallen in last 6 months? Yes. Number of falls 1  LIVING ENVIRONMENT: Family/patient expects to be discharged to:: Private residence Living Arrangements: Spouse/significant other Available Help at Discharge: Family Type of Home: House Home Access: Stairs to enter Secretary/administrator of Steps: 2 Entrance Stairs-Rails: Left;Right Home Layout: One level Bathroom Shower/Tub: Health visitor: Standard Home Equipment: Medical laboratory scientific officer - single point  PLOF: Independent; driving; retired from the Licensed conveyancer; classic cars  PATIENT GOALS: return to PLOF  OBJECTIVE:  Note: Objective measures were completed at Evaluation unless otherwise noted.  HAND DOMINANCE: Right  ADLs: Overall ADLs: mod I  IADLs:  Light housekeeping: mod I Community mobility: dependent Medication management: mod I Financial management: mod I Handwriting:  feels like he writes faster  MOBILITY STATUS: Independent  ACTIVITY TOLERANCE: Activity tolerance: good  FUNCTIONAL OUTCOME MEASURES: PSFS:    UPPER EXTREMITY ROM and MMT:    BUE: WNL  HAND FUNCTION: WFL  COORDINATION: WFL  SENSATION: WFL  EDEMA: no  edema  MUSCLE TONE: WNL  COGNITION: Overall cognitive status: Within functional limits for tasks assessed  VISION: Subjective report: no diplopia Baseline vision: Wears glasses all the time Visual history:  none  VISION  ASSESSMENT: WFL  PERCEPTION: Impaired: given L VF cut  PRAXIS: WFL  OBSERVATIONS: Pt appears well-kept. Glasses and ball-cap donned. Ambulates mod I with SPC. No LOB.                                                                                                                         TREATMENT:    OT provided pt with vision strategies and scanning activities as noted in pt instructions.   PATIENT EDUCATION: Education details: OT Role and POC; vision strategies and scanning activities Person educated: Patient Education method: Explanation and Handouts Education comprehension: verbalized understanding and needs further education  HOME EXERCISE PROGRAM: 09/23/2023: scanning activities and strategies  GOALS:  LONG TERM GOALS: Target date: 10/23/2023  Patient will independently recall at least 2 compensatory strategies for visual impairment without cueing. Baseline:  Goal status: INITIAL  2.  Patient will demonstrate at least 85% accuracy with environmental visual scanning to assist with ADLs and IADLS including potential driving considerations.  Baseline:  Goal status: INITIAL  3.  Will assess grip strength and coordination to write appropriate goal as needed. Baseline:  Goal status: INITIAL  ASSESSMENT:  CLINICAL IMPRESSION: Patient is a 78 y.o. male who was seen today for occupational therapy evaluation for CVA. Hx includes HTN and prostate cancer. Patient currently presents below baseline level of functioning demonstrating functional deficits and impairments as noted below. Pt would benefit from skilled OT services in the outpatient setting to work on impairments as noted below to help pt return to PLOF as able.    PERFORMANCE DEFICITS: in functional skills including ADLs, IADLs, mobility, and vision.   IMPAIRMENTS: are limiting patient from ADLs, IADLs, leisure, and social participation.   CO-MORBIDITIES: may have co-morbidities  that affects occupational performance.  Patient will benefit from skilled OT to address above impairments and improve overall function.  MODIFICATION OR ASSISTANCE TO COMPLETE EVALUATION: Min-Moderate modification of tasks or assist with assess necessary to complete an evaluation.  OT OCCUPATIONAL PROFILE AND HISTORY: Problem focused assessment: Including review of records relating to presenting problem.  CLINICAL DECISION MAKING: LOW - limited treatment options, no task modification necessary  REHAB POTENTIAL: Good  EVALUATION COMPLEXITY: Low    PLAN:  OT FREQUENCY: 1x/week  OT DURATION:  2 additional sessions  PLANNED INTERVENTIONS: 97168 OT Re-evaluation, 97535 self care/ADL training, 40981 therapeutic exercise, 97530 therapeutic activity, functional mobility training, visual/perceptual remediation/compensation, coping strategies training, patient/family education, and DME and/or AE instructions  RECOMMENDED OTHER SERVICES: N/A for this visit  CONSULTED AND AGREED WITH PLAN OF CARE: Patient  PLAN FOR NEXT SESSION: review scanning activities; assess letter cancellation and scanning in clinic   Delana Meyer, OT 09/23/2023, 11:16 AM

## 2023-09-27 NOTE — Progress Notes (Unsigned)
Cardiology Office Note:  .   Date:  09/28/2023  ID:  Charles Frost, DOB April 02, 1946, MRN [id number] PCP: Ellis Parents, FNP  [facility] Providers Cardiologist:  Yates Decamp, MD   History of Present Illness: .   Charles Frost is a 78 y.o. history of HTN, hyperglycemia, rheumatoid arthritis on long-term Humira weekly, prostate cancer, admitted 09/07/23 with acute right sided weakness, slurred speech, and aphasia. Admitted as CODE stroke, received tPA. Imaging consistent with acute left occipital stroke thought to be embolic from small vessel disease. As part of stroke workup echo obtained which showed LVEF 25-30%, new diagnosis for patient.    Presented again on 09/15/2023 with dizziness and presyncopal event.  MRI brain with acute cortical infarction in posterior left parietal lobe.  Concern for cardiac source, TEE with ASD with both right to left and left to right flow, likely etiology of his stroke vs cardiomyopathy with low EF.  He was recommended to use Zio patch monitoring however patient has not started doing this as he had confusion about how to put this on.  Discussed the use of AI scribe software for clinical note transcription with the patient, who gave verbal consent to proceed.  History of Present Illness   The patient, with a history of hypertension, prediabetes, rheumatoid arthritis, and prostate cancer, presents for a follow-up after a recent hospitalization due to a small stroke. During the hospitalization, the patient was found to have a weak heart and was started on heart failure medications. The patient reports feeling well and notes daily improvement in strength. The patient acknowledges some residual weakness but states it is nearly resolved. The patient experiences some shortness of breath, which seems to be improving. The patient has been walking for exercise and plans to return to the gym. There is confusion about the patient's medications, which is addressed  during the visit.       Labs   Lab Results  Component Value Date   CHOL 162 09/08/2023   HDL 51 09/08/2023   LDLCALC 105 (H) 09/08/2023   TRIG 29 09/08/2023   CHOLHDL 3.2 09/08/2023   Lab Results  Component Value Date   NA 141 09/16/2023   K 4.4 09/16/2023   CO2 24 09/16/2023   GLUCOSE 107 (H) 09/16/2023   BUN 25 (H) 09/16/2023   CREATININE 1.25 (H) 09/16/2023   CALCIUM 9.2 09/16/2023   GFRNONAA >60 09/16/2023      Latest Ref Rng & Units 09/16/2023    5:41 AM 09/15/2023    8:33 AM 09/15/2023    8:28 AM  BMP  Glucose 70 - 99 mg/dL 782  956  213   BUN 8 - 23 mg/dL 25  27  21    Creatinine 0.61 - 1.24 mg/dL 0.86  5.78  4.69   Sodium 135 - 145 mmol/L 141  142  140   Potassium 3.5 - 5.1 mmol/L 4.4  4.5  4.7   Chloride 98 - 111 mmol/L 105  107  106   CO2 22 - 32 mmol/L 24   24   Calcium 8.9 - 10.3 mg/dL 9.2   9.5       Latest Ref Rng & Units 09/16/2023    5:41 AM 09/15/2023    8:33 AM 09/15/2023    8:28 AM  CBC  WBC 4.0 - 10.5 K/uL 6.2   5.1   Hemoglobin 13.0 - 17.0 g/dL 62.9  52.8  41.3   Hematocrit 39.0 - 52.0 % 41.2  43.0  45.6   Platelets 150 - 400 K/uL 216   217    Lab Results  Component Value Date   HGBA1C 6.4 (H) 09/07/2023    Lab Results  Component Value Date   TSH 0.89 10/08/2022    Review of Systems  Cardiovascular:  Positive for dyspnea on exertion. Negative for chest pain and leg swelling.    Physical Exam:   VS:  BP 134/82 (BP Location: Left Arm, Patient Position: Sitting, Cuff Size: Small)   Pulse 87   Resp 16   Ht 5\' 8"  (1.727 m)   Wt 172 lb (78 kg)   SpO2 98%   BMI 26.15 kg/m    Wt Readings from Last 3 Encounters:  09/28/23 172 lb (78 kg)  09/16/23 177 lb 4 oz (80.4 kg)  09/07/23 177 lb 4 oz (80.4 kg)     Physical Exam Neck:     Vascular: No carotid bruit or JVD.  Cardiovascular:     Rate and Rhythm: Normal rate and regular rhythm.     Pulses: Intact distal pulses.     Heart sounds: Normal heart sounds. No murmur heard.    No gallop.   Pulmonary:     Effort: Pulmonary effort is normal.     Breath sounds: Normal breath sounds.  Abdominal:     General: Bowel sounds are normal.     Palpations: Abdomen is soft.  Musculoskeletal:     Right lower leg: No edema.     Left lower leg: No edema.     Studies Reviewed: Marland Kitchen    ECHOCARDIOGRAM COMPLETE 09/07/2023  Left ventricular ejection fraction, by estimation, is 25 to 30%. Left ventricular ejection fraction by PLAX is 34 %. The left ventricle has severely decreased function. The left ventricle demonstrates global hypokinesis. There is mild concentric left ventricular hypertrophy. Left ventricular diastolic parameters are consistent with Grade I diastolic dysfunction (impaired relaxation). NO significant valvular abnormality.   ECHO TEE 09/16/2023  1. Left ventricular ejection fraction, by estimation, is 25 to 30% with global hypokinesis. The left ventricle has severely decreased function. 2. Right ventricular systolic function is normal. The right ventricular size is normal. 3. Left atrial size was mildly dilated. No left atrial/left atrial appendage thrombus was detected. The LAA emptying velocity was 60 cm/s. 3. Evidence of atrial level shunting detected by color flow Doppler. Agitated saline contrast bubble study was positive with shunting observed within 3-6 cardiac cycles suggestive of interatrial shunt. There is a moderately sized secundum atrial septal defect with bidirectional shunting across the atrial septum.  EKG:    EKG Interpretation Date/Time:  Monday September 28 2023 10:36:24 EST Ventricular Rate:  81 PR Interval:  128 QRS Duration:  90 QT Interval:  352 QTC Calculation: 408 R Axis:   -63  Text Interpretation: EKG 10/06/2023: Normal sinus rhythm at rate of 81 bpm, left anterior fascicular block.  No evidence of ischemia.  Compared to 09/15/2023, no significant change. Confirmed by Delrae Rend [phone]) on 09/28/2023 10:43:31 AM    Medications and allergies     No Known Allergies   Current Outpatient Medications:    acetaminophen (TYLENOL) 500 MG tablet, Take 1,000 mg by mouth 2 (two) times daily as needed for moderate pain (pain score 4-6), fever or headache., Disp: , Rfl:    adalimumab (HUMIRA, 2 PEN,) 40 MG/0.4ML pen, Inject 40 mg into the skin See admin instructions. Inject 40mg  subcutaneously every other Monday., Disp: , Rfl:    apixaban (ELIQUIS) 5  MG TABS tablet, Take 1 tablet (5 mg total) by mouth 2 (two) times daily., Disp: 60 tablet, Rfl: 1   diclofenac Sodium (VOLTAREN) 1 % GEL, Apply 2 g topically 2 (two) times daily as needed (pain)., Disp: , Rfl:    gabapentin (NEURONTIN) 300 MG capsule, Take 300 mg by mouth at bedtime., Disp: , Rfl:    sacubitril-valsartan (ENTRESTO) 49-51 MG, Take 1 tablet by mouth 2 (two) times daily., Disp: 60 tablet, Rfl: 1   metoprolol succinate (TOPROL-XL) 50 MG 24 hr tablet, Take 1 tablet (50 mg total) by mouth daily., Disp: 30 tablet, Rfl: 2   rosuvastatin (CRESTOR) 20 MG tablet, Take 1 tablet (20 mg total) by mouth daily., Disp: , Rfl:    sacubitril-valsartan (ENTRESTO) 24-26 MG, Take 1 tablet by mouth 2 (two) times daily., Disp: 60 tablet, Rfl: 1   ASSESSMENT AND PLAN: .      ICD-10-CM   1. HFrEF (heart failure with reduced ejection fraction) (HCC)  I50.20 EKG 12-Lead    sacubitril-valsartan (ENTRESTO) 24-26 MG    sacubitril-valsartan (ENTRESTO) 49-51 MG    metoprolol succinate (TOPROL-XL) 50 MG 24 hr tablet    DISCONTINUED: metoprolol succinate (TOPROL-XL) 50 MG 24 hr tablet    2. 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 Primary hypertension  I10 Basic metabolic panel    5. Hypercholesteremia  E78.00 rosuvastatin (CRESTOR) 20 MG tablet     1. HFrEF (heart failure with reduced ejection fraction) (HCC) (Primary) Patient with severe LV systolic dysfunction noted during recent hospitalization with stroke on 09/07/2023 and TEE performed on 09/16/2023 for recurrent  strokelike symptoms revealing an ASD as well.  Although paradoxical embolism could explain his ASD, given his advanced age, more likely atrial fibrillation could be the etiology.  He is presently on Eliquis which she is tolerating, continue the same for now. Patient was advised to apply Zio patch monitoring however they did not know how to do this.  I have given them instructions of applying the patch but also advised them to contact the company contact number that is in the package to help them assist with placing the patch.  I will follow-up on this on the next office visit.  2. Dilated cardiomyopathy (HCC) Patient with cardiomyopathy, suspect nonischemic cardiomyopathy in view of global hypokinesis.  He does have underlying shortness of breath, he will need ischemic workup in view of dyspnea and severe LV systolic dysfunction but would like to optimize him medically for now. Patient is completely confused about his medications after the second hospitalization.  He has discontinued his metoprolol and also Entresto.  I restarted him back on metoprolol succinate 25 mg daily and Entresto 24/26 mg twice daily but will uptitrate to 50 mg of metoprolol succinate and next dose of Entresto and will obtain BMP prior to his next office visit with me in 4 weeks.  Clear-cut instructions which were written to be given to the patient.  3. ASD secundum As dictated above, no clinical evidence of heart failure, he is well compensated, no RV dilatation to suggest atrial septal defect is a major player with regard to hemodynamic compromise, although could lead to paradoxical embolus, and a 78 year old gentleman will probably assume atrial fibrillation to the most common etiology for recurrent embolic strokes and presently on appropriately on anticoagulation with Eliquis.  4.  Hypercholesterolemia Patient previously on Lipitor, was transition to Crestor, however patient has discontinued this as well.  I have restarted him  back on 20 mg of Crestor.  Medication reconciliation performed, clear-cut instructions were given to the patient, patient was not on appropriate medical therapy following hospital discharge although AVS was provided to them.  Advised him to bring all the medication to the visit as well.  Signed,  Yates Decamp, MD, [facility] 09/28/2023, 1:14 PM [facility] [address] [place], [place] [address] Phone: [phone]. Fax:  [phone]

## 2023-09-28 ENCOUNTER — Ambulatory Visit: Payer: Medicare HMO | Attending: Cardiology | Admitting: Cardiology

## 2023-09-28 ENCOUNTER — Encounter: Payer: Self-pay | Admitting: Cardiology

## 2023-09-28 VITALS — BP 134/82 | HR 87 | Resp 16 | Ht 68.0 in | Wt 172.0 lb

## 2023-09-28 DIAGNOSIS — I420 Dilated cardiomyopathy: Secondary | ICD-10-CM | POA: Diagnosis not present

## 2023-09-28 DIAGNOSIS — Q2111 Secundum atrial septal defect: Secondary | ICD-10-CM | POA: Diagnosis not present

## 2023-09-28 DIAGNOSIS — I10 Essential (primary) hypertension: Secondary | ICD-10-CM | POA: Diagnosis not present

## 2023-09-28 DIAGNOSIS — I5020 Unspecified systolic (congestive) heart failure: Secondary | ICD-10-CM

## 2023-09-28 DIAGNOSIS — E7800 Pure hypercholesterolemia, unspecified: Secondary | ICD-10-CM | POA: Diagnosis not present

## 2023-09-28 MED ORDER — METOPROLOL SUCCINATE ER 50 MG PO TB24: 50.0000 mg | ORAL_TABLET | Freq: Every day | ORAL | 2 refills | Status: DC

## 2023-09-28 MED ORDER — ROSUVASTATIN CALCIUM 20 MG PO TABS: 20.0000 mg | ORAL_TABLET | Freq: Every day | ORAL | Status: DC

## 2023-09-28 MED ORDER — ENTRESTO 49-51 MG PO TABS: 1.0000 | ORAL_TABLET | Freq: Two times a day (BID) | ORAL | 1 refills | Status: DC

## 2023-09-28 MED ORDER — SACUBITRIL-VALSARTAN 24-26 MG PO TABS: 1.0000 | ORAL_TABLET | Freq: Two times a day (BID) | ORAL | 1 refills | Status: DC

## 2023-09-28 NOTE — Patient Instructions (Addendum)
Medication Instructions:  Your physician has recommended you make the following change in your medication: Resume Rosuvastatin 20 mg by mouth daily  Continue current supply of Entresto 24/26 mg by mouth twice daily.  When you finish current supply start Entresto 49/51 by mouth twice daily. Continue current supply of Metoprolol Succinate 25 mg by mouth daily .  When you finish current supply start Metoprolol Succinate 50 mg by mouth daily   *If you need a refill on your cardiac medications before your next appointment, please call your pharmacy*   Lab Work: Have lab work (BMP) done at [facility] on Feb 14 at [facility].  There is an office on the first floor of our building.  This is not fasting If you have labs (blood work) drawn today and your tests are completely normal, you will receive your results only by: MyChart Message (if you have MyChart) OR A paper copy in the mail If you have any lab test that is abnormal or we need to change your treatment, we will call you to review the results.   Testing/Procedures: none   Follow-Up: At [facility], you and your health needs are our priority.  As part of our continuing mission to provide you with exceptional heart care, we have created designated Provider Care Teams.  These Care Teams include your primary Cardiologist (physician) and Advanced Practice Providers (APPs -  Physician Assistants and Nurse Practitioners) who all work together to provide you with the care you need, when you need it.  We recommend signing up for the patient portal called "MyChart".  Sign up information is provided on this After Visit Summary.  MyChart is used to connect with patients for Virtual Visits (Telemedicine).  Patients are able to view lab/test results, encounter notes, upcoming appointments, etc.  Non-urgent messages can be sent to your provider as well.   To learn more about what you can do with MyChart, go to [email or web address].    Your next  appointment:   2/18 at 3 PM  Provider:   Yates Decamp, MD     Other Instructions

## 2023-09-30 ENCOUNTER — Ambulatory Visit: Payer: Medicare HMO | Admitting: Occupational Therapy

## 2023-09-30 DIAGNOSIS — I429 Cardiomyopathy, unspecified: Secondary | ICD-10-CM | POA: Diagnosis not present

## 2023-09-30 DIAGNOSIS — I639 Cerebral infarction, unspecified: Secondary | ICD-10-CM | POA: Diagnosis not present

## 2023-09-30 DIAGNOSIS — R42 Dizziness and giddiness: Secondary | ICD-10-CM | POA: Diagnosis not present

## 2023-10-06 DIAGNOSIS — N401 Enlarged prostate with lower urinary tract symptoms: Secondary | ICD-10-CM | POA: Diagnosis not present

## 2023-10-07 ENCOUNTER — Ambulatory Visit: Payer: Medicare HMO | Admitting: Occupational Therapy

## 2023-10-07 DIAGNOSIS — R29818 Other symptoms and signs involving the nervous system: Secondary | ICD-10-CM

## 2023-10-07 DIAGNOSIS — R293 Abnormal posture: Secondary | ICD-10-CM | POA: Diagnosis not present

## 2023-10-07 DIAGNOSIS — L910 Hypertrophic scar: Secondary | ICD-10-CM | POA: Diagnosis not present

## 2023-10-07 DIAGNOSIS — R2681 Unsteadiness on feet: Secondary | ICD-10-CM | POA: Diagnosis not present

## 2023-10-07 DIAGNOSIS — I639 Cerebral infarction, unspecified: Secondary | ICD-10-CM | POA: Diagnosis not present

## 2023-10-07 DIAGNOSIS — R41842 Visuospatial deficit: Secondary | ICD-10-CM

## 2023-10-07 DIAGNOSIS — R262 Difficulty in walking, not elsewhere classified: Secondary | ICD-10-CM | POA: Diagnosis not present

## 2023-10-07 DIAGNOSIS — M6281 Muscle weakness (generalized): Secondary | ICD-10-CM | POA: Diagnosis not present

## 2023-10-07 NOTE — Therapy (Signed)
OUTPATIENT OCCUPATIONAL THERAPY NEURO TREATMENT AND DISCHARGE  Patient Name: Charles Frost MRN: [id number] DOB:[birth year], 78 y.o., male Today's Date: 10/07/2023  OCCUPATIONAL THERAPY DISCHARGE SUMMARY  Visits from Start of Care: 2  Current functional level related to goals / functional outcomes: Patient has met all long-term goals to date.   Remaining deficits: Slight L visual field cut.    Education / Equipment: Continue with vision strategies following OT d/c   Patient agrees to discharge. Patient goals were met. Patient is being discharged due to meeting the stated rehab goals.Marland Kitchen     PCP: Ellis Parents, FNP  REFERRING PROVIDER: McDiarmid, Leighton Roach, MD  END OF SESSION:  OT End of Session - 10/07/23 1103     Visit Number 2    Number of Visits 3    Date for OT Re-Evaluation 10/23/23    Authorization Type [employer]    OT Start Time 1104    OT Stop Time 1149    OT Time Calculation (min) 45 min    Activity Tolerance Patient tolerated treatment well    Behavior During Therapy WFL for tasks assessed/performed             Past Medical History:  Diagnosis Date   GERD (gastroesophageal reflux disease)    Hypercholesterolemia    Hypertension    Prostate cancer (HCC)    Rheumatoid arthritis (HCC)    RA   Past Surgical History:  Procedure Laterality Date   cysto uretero remove stone  2009   cystoscopy insert stent  2009   PROSTATE BIOPSY     TRANSESOPHAGEAL ECHOCARDIOGRAM (CATH LAB) N/A 09/16/2023   Procedure: TRANSESOPHAGEAL ECHOCARDIOGRAM;  Surgeon: Jodelle Red, MD;  Location: [facility] INVASIVE CV LAB;  Service: Cardiovascular;  Laterality: N/A;   ulnar nerve release Right 2008   Patient Active Problem List   Diagnosis Date Noted   Chronic heart failure with reduced ejection fraction (HFrEF, <= 40%) (HCC) 09/16/2023   Hypercholesteremia 09/16/2023   Stroke (HCC) 09/15/2023   HFrEF (heart failure with reduced ejection fraction) (HCC) 09/15/2023    Acute ischemic left PCA stroke (HCC) 09/07/2023   UTI (urinary tract infection) 12/08/2016   Bladder pain 12/07/2016   Anemia 12/07/2016   Hyperglycemia 12/07/2016   Hypertension 12/07/2016   Malignant neoplasm of prostate (HCC) 08/28/2016    ONSET DATE: 09/16/2023 (Date of referral); 09/07/2023. (date of most recent stroke)  REFERRING DIAG: I63.9 (ICD-10-CM) - Cerebrovascular accident (CVA), unspecified mechanism  THERAPY DIAG:  Visuospatial deficit  Other symptoms and signs involving the nervous system  Rationale for Evaluation and Treatment: Rehabilitation  SUBJECTIVE:   SUBJECTIVE STATEMENT: He feels like his vision has improved since his initial stroke.  Pt accompanied by: self  PERTINENT HISTORY: PMH of HTN and prostate cancer  Patient is a 78 yo male presenting to the ED with dysarthria, dizziness, and R sided weakness on 09/15/23. MRI showing new small acute infarcts in L occipital region and cortical infarction in the posterior left parietal lobe. Previous occipital infarct did not progress.  PRECAUTIONS: Fall and Other: Left visual field cut  WEIGHT BEARING RESTRICTIONS: No  PAIN:  Are you having pain? No  FALLS: Has patient fallen in last 6 months? Yes. Number of falls 1  LIVING ENVIRONMENT: Family/patient expects to be discharged to:: Private residence Living Arrangements: Spouse/significant other Available Help at Discharge: Family Type of Home: House Home Access: Stairs to enter Secretary/administrator of Steps: 2 Entrance Stairs-Rails: Left;Right Home Layout: One level Bathroom Shower/Tub: Walk-in  shower Bathroom Toilet: Standard Home Equipment: Cane - single point  PLOF: Independent; driving; retired from the [employer] convey[employer]; classic cars  PATIENT GOALS: return to PLOF  OBJECTIVE:  Note: Objective measures were completed at Evaluation unless otherwise noted.  HAND DOMINANCE: Right  ADLs: Overall ADLs: mod I  IADLs:  Light housekeeping:  mod I Community mobility: dependent Medication management: mod I Financial management: mod I Handwriting:  feels like he writes faster  MOBILITY STATUS: Independent  ACTIVITY TOLERANCE: Activity tolerance: good  FUNCTIONAL OUTCOME MEASURES: PSFS:    10/07/2023:    UPPER EXTREMITY ROM and MMT:    BUE: WNL  HAND FUNCTION: WFL  COORDINATION: WFL  SENSATION: WFL  EDEMA: no edema  MUSCLE TONE: WNL  COGNITION: Overall cognitive status: Within functional limits for tasks assessed  VISION: Subjective report: no diplopia Baseline vision: Wears glasses all the time Visual history:  none  VISION ASSESSMENT: WFL  PERCEPTION: Impaired: given L VF cut  PRAXIS: WFL  OBSERVATIONS: Pt appears well-kept. Glasses and ball-cap donned. Ambulates mod I with SPC. No LOB.                                                                                                                         TREATMENT:    OT reviewed vision strategies and scanning activities as noted in pt instructions.   Pt demonstrated 90% accuracy with hallway scanning for safe environmental navigation  100% with letter cancellation for accuracy with visual scanning and object location  OT placed 5 BlazePods in front of patient on mirror and had patient tap pods with palm of hand as pods lit up using OT Distraction and Scanning mode forreaction time, scanning and locating of items, and processing.   Hits were as follows:  41 hits for 2 min duration and 2.4 sec reaction time 74 hits for 2 min duration and 1.3 sec reaction time Therapist reviewed goals with patient and updated patient progression.  No additional functional limitations identified.  PATIENT EDUCATION: Education details: OT d/c; vision strategies and scanning activities Person educated: Patient Education method: Explanation and Handouts Education comprehension: verbalized understanding and needs further education  HOME EXERCISE  PROGRAM: 09/23/2023: scanning activities and strategies  GOALS:  LONG TERM GOALS: Target date: 10/23/2023  Patient will independently recall at least 2 compensatory strategies for visual impairment without cueing. Baseline:  Goal status: MET  2.  Patient will demonstrate at least 85% accuracy with environmental visual scanning to assist with ADLs and IADLS including potential driving considerations.  Baseline:  Goal status: MET  3.  Will assess grip strength and coordination to write appropriate goal as needed. Baseline: Grip -  R: 58 lbs; L: 52 lbs  R: 27 seconds; L: 25 seconds Goal status: MET  ASSESSMENT:  CLINICAL IMPRESSION: Patient is a 78 y.o. male who was seen today for occupational therapy evaluation for CVA. Hx includes HTN and prostate cancer. Patient currently presents below baseline level of functioning demonstrating functional deficits and  impairments as noted below. Pt would benefit from skilled OT services in the outpatient setting to work on impairments as noted below to help pt return to PLOF as able.    PERFORMANCE DEFICITS: in functional skills including ADLs, IADLs, mobility, and vision.   IMPAIRMENTS: are limiting patient from ADLs, IADLs, leisure, and social participation.   CO-MORBIDITIES: may have co-morbidities  that affects occupational performance. Patient will benefit from skilled OT to address above impairments and improve overall function.  REHAB POTENTIAL: Good  PLAN:  OT D/C Completed   Delana Meyer, OT 10/07/2023, 2:02 PM

## 2023-10-08 DIAGNOSIS — I639 Cerebral infarction, unspecified: Secondary | ICD-10-CM | POA: Diagnosis not present

## 2023-10-08 DIAGNOSIS — R42 Dizziness and giddiness: Secondary | ICD-10-CM | POA: Diagnosis not present

## 2023-10-10 NOTE — Progress Notes (Signed)
No atrial fibrillation, brief VT, I will discuss further on his office visit in 2 weeks.

## 2023-10-19 ENCOUNTER — Other Ambulatory Visit (HOSPITAL_COMMUNITY): Payer: Self-pay

## 2023-10-20 ENCOUNTER — Other Ambulatory Visit: Payer: Self-pay | Admitting: *Deleted

## 2023-10-20 DIAGNOSIS — Q2111 Secundum atrial septal defect: Secondary | ICD-10-CM

## 2023-10-20 MED ORDER — APIXABAN 5 MG PO TABS: 5.0000 mg | ORAL_TABLET | Freq: Two times a day (BID) | ORAL | 1 refills | Status: AC

## 2023-10-20 NOTE — Telephone Encounter (Signed)
Eliquis 5mg  refill request received. Patient is 78 years old, weight-78kg, Crea- 1.25 on 09/16/23, Diagnosis-per last OV note, it states: ASD secundum As dictated above, no clinical evidence of heart failure, he is well compensated, no RV dilatation to suggest atrial septal defect is a major player with regard to hemodynamic compromise, although could lead to paradoxical embolus, and a 78 year old gentleman will probably assume atrial fibrillation to the most common etiology for recurrent embolic strokes and presently on appropriately on anticoagulation with Eliquis", and last seen by Dr. Jacinto Halim on 09/28/23. Dose is appropriate based per MD note. Will send in refill to requested pharmacy.

## 2023-10-23 DIAGNOSIS — I10 Essential (primary) hypertension: Secondary | ICD-10-CM | POA: Diagnosis not present

## 2023-10-24 LAB — BASIC METABOLIC PANEL
BUN/Creatinine Ratio: 15 (ref 10–24)
BUN: 16 mg/dL (ref 8–27)
CO2: 26 mmol/L (ref 20–29)
Calcium: 9.6 mg/dL (ref 8.6–10.2)
Chloride: 104 mmol/L (ref 96–106)
Creatinine, Ser: 1.04 mg/dL (ref 0.76–1.27)
Glucose: 96 mg/dL (ref 70–99)
Potassium: 4.5 mmol/L (ref 3.5–5.2)
Sodium: 143 mmol/L (ref 134–144)
eGFR: 75 mL/min/{1.73_m2} (ref >59)

## 2023-10-27 ENCOUNTER — Ambulatory Visit: Payer: Medicare HMO | Attending: Cardiology | Admitting: Cardiology

## 2023-10-27 ENCOUNTER — Encounter: Payer: Self-pay | Admitting: Cardiology

## 2023-10-27 VITALS — BP 118/65 | HR 78 | Resp 16 | Ht 68.0 in | Wt 171.2 lb

## 2023-10-27 DIAGNOSIS — I420 Dilated cardiomyopathy: Secondary | ICD-10-CM

## 2023-10-27 DIAGNOSIS — E7800 Pure hypercholesterolemia, unspecified: Secondary | ICD-10-CM

## 2023-10-27 DIAGNOSIS — I5020 Unspecified systolic (congestive) heart failure: Secondary | ICD-10-CM

## 2023-10-27 DIAGNOSIS — I10 Essential (primary) hypertension: Secondary | ICD-10-CM

## 2023-10-27 MED ORDER — METOPROLOL SUCCINATE ER 50 MG PO TB24: 50.0000 mg | ORAL_TABLET | Freq: Two times a day (BID) | ORAL | 6 refills | Status: DC

## 2023-10-27 NOTE — Patient Instructions (Addendum)
Medication Instructions:  Your physician has recommended you make the following change in your medication:  Increase metoprolol succinate to 50 mg by mouth twice daily  *If you need a refill on your cardiac medications before your next appointment, please call your pharmacy*   Lab Work: none If you have labs (blood work) drawn today and your tests are completely normal, you will receive your results only by: MyChart Message (if you have MyChart) OR A paper copy in the mail If you have any lab test that is abnormal or we need to change your treatment, we will call you to review the results.   Testing/Procedures: Your physician has requested that you have an echocardiogram in 3 months a few days prior to office visit. Echocardiography is a painless test that uses sound waves to create images of your heart. It provides your doctor with information about the size and shape of your heart and how well your heart's chambers and valves are working. This procedure takes approximately one hour. There are no restrictions for this procedure. Please do NOT wear cologne, perfume, aftershave, or lotions (deodorant is allowed). Please arrive 15 minutes prior to your appointment time.  Please note: We ask at that you not bring children with you during ultrasound (echo/ vascular) testing. Due to room size and safety concerns, children are not allowed in the ultrasound rooms during exams. Our front office staff cannot provide observation of children in our lobby area while testing is being conducted. An adult accompanying a patient to their appointment will only be allowed in the ultrasound room at the discretion of the ultrasound technician under special circumstances. We apologize for any inconvenience.    Follow-Up: At [facility], you and your health needs are our priority.  As part of our continuing mission to provide you with exceptional heart care, we have created designated Provider Care  Teams.  These Care Teams include your primary Cardiologist (physician) and Advanced Practice Providers (APPs -  Physician Assistants and Nurse Practitioners) who all work together to provide you with the care you need, when you need it.  We recommend signing up for the patient portal called "MyChart".  Sign up information is provided on this After Visit Summary.  MyChart is used to connect with patients for Virtual Visits (Telemedicine).  Patients are able to view lab/test results, encounter notes, upcoming appointments, etc.  Non-urgent messages can be sent to your provider as well.   To learn more about what you can do with MyChart, go to [email or web address].    Your next appointment:   3 month(s)  Provider:   Yates Decamp, MD     Other Instructions

## 2023-10-27 NOTE — Progress Notes (Signed)
Cardiology Office Note:  .   Date:  10/27/2023  ID:  Charles Frost, DOB 1945-12-28, MRN [id number] PCP: Ellis Parents, FNP   [facility]Care Providers Cardiologist:  Yates Decamp, MD   History of Present Illness: .   ELAZAR Frost is a 78 y.o. history of HTN, hyperglycemia, rheumatoid arthritis on long-term Humira weekly, prostate cancer S/P TURP 2017, admitted 09/07/23 with acute right sided weakness, slurred speech, and aphasia with acute left occipital stroke thought to be embolic stroke involving small vessel, MRI brain with acute left occipital lobe and remote right occipital and bilateral cerebellar infarcts. As part of stroke workup echo obtained which showed LVEF 25-30%, new diagnosis for patient.  He also revealed a small ASD with bidirectional shunting.    He presented for the second time on 09/15/2023 with slurred speech and MRI of the brain revealed new acute cortical infarct in the left parietal lobe.  Patient was transition from aspirin and Plavix to Eliquis. Embolic stroke was felt to be cardiac in origin.  Patient now on Eliquis.  Presents for follow-up of cardiomyopathy and to discuss outpatient extended EKG monitoring.  Wife present.   Discussed the use of AI scribe software for clinical note transcription with the patient, who gave verbal consent to proceed.  History of Present Illness   [name], a patient with a history of stroke and prostate issues, presents with no new complaints or concerns. He is currently on Eliquis following his stroke. He has not previously been on aspirin or Plavix.    Labs   Lab Results  Component Value Date   CHOL 162 09/08/2023   HDL 51 09/08/2023   LDLCALC 105 (H) 09/08/2023   TRIG 29 09/08/2023   CHOLHDL 3.2 09/08/2023   Lab Results  Component Value Date   NA 143 10/23/2023   K 4.5 10/23/2023   CO2 26 10/23/2023   GLUCOSE 96 10/23/2023   BUN 16 10/23/2023   CREATININE 1.04 10/23/2023   CALCIUM 9.6 10/23/2023   EGFR 75  10/23/2023   GFRNONAA >60 09/16/2023      Latest Ref Rng & Units 10/23/2023    3:23 PM 09/16/2023    5:41 AM 09/15/2023    8:33 AM  BMP  Glucose 70 - 99 mg/dL 96  409  811   BUN 8 - 27 mg/dL 16  25  27    Creatinine 0.76 - 1.27 mg/dL 9.14  7.82  9.56   BUN/Creat Ratio 10 - 24 15     Sodium 134 - 144 mmol/L 143  141  142   Potassium 3.5 - 5.2 mmol/L 4.5  4.4  4.5   Chloride 96 - 106 mmol/L 104  105  107   CO2 20 - 29 mmol/L 26  24    Calcium 8.6 - 10.2 mg/dL 9.6  9.2        Latest Ref Rng & Units 09/16/2023    5:41 AM 09/15/2023    8:33 AM 09/15/2023    8:28 AM  CBC  WBC 4.0 - 10.5 K/uL 6.2   5.1   Hemoglobin 13.0 - 17.0 g/dL 21.3  08.6  57.8   Hematocrit 39.0 - 52.0 % 41.2  43.0  45.6   Platelets 150 - 400 K/uL 216   217    Lab Results  Component Value Date   HGBA1C 6.4 (H) 09/07/2023    Lab Results  Component Value Date   TSH 0.89 10/08/2022    Review of Systems  Cardiovascular:  Negative for chest pain, dyspnea on exertion and leg swelling.   Physical Exam:   VS:  BP 118/65 (BP Location: Right Arm, Patient Position: Sitting, Cuff Size: Normal)   Pulse 78   Resp 16   Ht 5\' 8"  (1.727 m)   Wt 171 lb 3.2 oz (77.7 kg)   SpO2 97%   BMI 26.03 kg/m    Wt Readings from Last 3 Encounters:  10/27/23 171 lb 3.2 oz (77.7 kg)  09/28/23 172 lb (78 kg)  09/16/23 177 lb 4 oz (80.4 kg)    Physical Exam Neck:     Vascular: No carotid bruit or JVD.  Cardiovascular:     Rate and Rhythm: Normal rate and regular rhythm.     Pulses: Intact distal pulses.     Heart sounds: Normal heart sounds. No murmur heard.    No gallop.  Pulmonary:     Effort: Pulmonary effort is normal.     Breath sounds: Normal breath sounds.  Abdominal:     General: Bowel sounds are normal.     Palpations: Abdomen is soft.  Musculoskeletal:     Right lower leg: No edema.     Left lower leg: No edema.    Studies Reviewed: Marland Kitchen    ECHO TEE 09/16/2023  1. Left ventricular ejection fraction, by estimation, is  25 to 30% with global hypokinesis. The left ventricle has severely decreased function. 2. Right ventricular systolic function is normal. The right ventricular size is normal. 3. Left atrial size was mildly dilated. No left atrial/left atrial appendage thrombus was detected. The LAA emptying velocity was 60 cm/s. 3. Evidence of atrial level shunting detected by color flow Doppler. Agitated saline contrast bubble study was positive with shunting observed within 3-6 cardiac cycles suggestive of interatrial shunt. There is a moderately sized secundum atrial septal defect with bidirectional shunting across the atrial septum.  Extended out patient EKG monitoring days starting 09/30/2023: Predominant rhythm is normal sinus rhythm.  Minimum sinus heart rate was 62 and maximum 108 bpm. There were 5 NSVT episodes longest 11 beats, 6.8 seconds with ventricular rate of 100 bpm, fastest 5 beat for 2 seconds. There were 6 brief atrial tachycardia episodes longest 15 beats. Occasional PACs and atrial couplets and triplets, occasional PVCs and rare ventricular couplets were present.  Ventricular bigeminy was present. There was no atrial fibrillation, no heart block.  No symptoms reported.         EKG:         Medications and allergies    No Known Allergies   Current Outpatient Medications:    acetaminophen (TYLENOL) 500 MG tablet, Take 1,000 mg by mouth 2 (two) times daily as needed for moderate pain (pain score 4-6), fever or headache., Disp: , Rfl:    adalimumab (HUMIRA, 2 PEN,) 40 MG/0.4ML pen, Inject 40 mg into the skin See admin instructions. Inject 40mg  subcutaneously every other Monday., Disp: , Rfl:    Adalimumab-bwwd 40 MG/0.4ML SOAJ, Inject 40 mg into the skin every 14 (fourteen) days., Disp: , Rfl:    apixaban (ELIQUIS) 5 MG TABS tablet, Take 1 tablet (5 mg total) by mouth 2 (two) times daily., Disp: 180 tablet, Rfl: 1   diclofenac Sodium (VOLTAREN) 1 % GEL, Apply 2 g topically 2 (two) times  daily as needed (pain)., Disp: , Rfl:    folic acid (FOLVITE) 1 MG tablet, Take 1 mg by mouth daily., Disp: , Rfl:    gabapentin (NEURONTIN) 300 MG capsule,  Take 300 mg by mouth at bedtime., Disp: , Rfl:    metoprolol succinate (TOPROL XL) 50 MG 24 hr tablet, Take 1 tablet (50 mg total) by mouth 2 (two) times daily. Take with or immediately following a meal., Disp: 60 tablet, Rfl: 6   rosuvastatin (CRESTOR) 20 MG tablet, Take 1 tablet (20 mg total) by mouth daily., Disp: , Rfl:    sacubitril-valsartan (ENTRESTO) 24-26 MG, Take 1 tablet by mouth 2 (two) times daily., Disp: 60 tablet, Rfl: 1   tamsulosin (FLOMAX) 0.4 MG CAPS capsule, Take 0.4 mg by mouth 2 (two) times daily., Disp: , Rfl:    sacubitril-valsartan (ENTRESTO) 49-51 MG, Take 1 tablet by mouth 2 (two) times daily. (Patient not taking: Reported on 10/27/2023), Disp: 60 tablet, Rfl: 1   ASSESSMENT AND PLAN: .      ICD-10-CM   1. HFrEF (heart failure with reduced ejection fraction) (HCC)  I50.20 ECHOCARDIOGRAM COMPLETE    2. Dilated cardiomyopathy (HCC)  I42.0 ECHOCARDIOGRAM COMPLETE    3. Hypercholesteremia  E78.00     4. Primary hypertension  I10       Assessment and Plan    Dilated Cardiomyopathy I suspect nonischemic cardiomyopathy as LVEF reveals global hypokinesis.  Current medications include metoprolol succinate, Entresto.  Plan to titrate these to the highest tolerable doses to optimize cardiac function. Potential side effects such as dizziness and hypotension were discussed.  Monitor blood pressure and report any adverse effects. Increase metoprolol succinate to 50 mg twice daily and Entresto to 49/51 mg twice daily after the current dose is finished.  Follow up in 3 months with a repeat echocardiogram and will consider scheduling a stress test to evaluate for ischemic etiology on his next visit.  I reviewed his cardiac telemetry there is done in the outpatient basis, although has had 1 episode of NSVT, will optimize his  medical therapy and with no syncope, do not think there is indication for ICD implantation presently.  There is no clinical evidence of heart failure either.  He is not on Jardiance or aldactone.  He remains asymptomatic with regard to HFrEF.  Hypertension Long-standing hypertension persists despite current treatment with losartan and Lasix, as blood pressure readings remain elevated. Plan to start amlodipine 10 mg once daily to improve control. Follow up with the primary care practitioner.  Stroke Currently on Eliquis for anticoagulation. Discussed the possibility of switching to aspirin pending neurologist evaluation, explaining the risks and benefits of each option.  After evaluation of his records, patient was on aspirin and Plavix when he presented with a second stroke hence although he has ASD and may lead to paradoxical stroke, given his age, paradoxical stroke would be less likely than atrial fibrillation.  Would probably recommend long-term anticoagulation with Eliquis.  He may benefit from neurology follow-up as well. He needs lipid profile testing, goal LDL <70.  Will send a note to his PCP.  Office visit in 3 months.  Total time spent was 43 minutes in evaluation of his medical records from the hospitalization again confirming his diagnosis again, making therapeutic decisions, making clear of his medications and medication list and coordination of care.  Signed,  Yates Decamp, MD, [facility] 10/27/2023, 8:55 PM [facility] [address] [place], [place] [address] Phone: [phone]. Fax:  [phone]

## 2023-11-11 DIAGNOSIS — C61 Malignant neoplasm of prostate: Secondary | ICD-10-CM | POA: Diagnosis not present

## 2023-11-11 DIAGNOSIS — L910 Hypertrophic scar: Secondary | ICD-10-CM | POA: Diagnosis not present

## 2023-11-18 DIAGNOSIS — C61 Malignant neoplasm of prostate: Secondary | ICD-10-CM | POA: Diagnosis not present

## 2023-11-18 DIAGNOSIS — R350 Frequency of micturition: Secondary | ICD-10-CM | POA: Diagnosis not present

## 2023-11-18 DIAGNOSIS — N401 Enlarged prostate with lower urinary tract symptoms: Secondary | ICD-10-CM | POA: Diagnosis not present

## 2023-12-09 ENCOUNTER — Encounter: Payer: Self-pay | Admitting: Neurology

## 2023-12-09 ENCOUNTER — Ambulatory Visit (INDEPENDENT_AMBULATORY_CARE_PROVIDER_SITE_OTHER): Payer: Medicare HMO | Admitting: Neurology

## 2023-12-09 VITALS — BP 136/72 | HR 76 | Ht 67.0 in | Wt 175.0 lb

## 2023-12-09 DIAGNOSIS — I69398 Other sequelae of cerebral infarction: Secondary | ICD-10-CM

## 2023-12-09 DIAGNOSIS — I639 Cerebral infarction, unspecified: Secondary | ICD-10-CM

## 2023-12-09 DIAGNOSIS — H53469 Homonymous bilateral field defects, unspecified side: Secondary | ICD-10-CM

## 2023-12-09 DIAGNOSIS — E785 Hyperlipidemia, unspecified: Secondary | ICD-10-CM | POA: Diagnosis not present

## 2023-12-09 NOTE — Progress Notes (Signed)
 [facility] [address] [place]. [place] [address] [phone]       OFFICE FOLLOW-UP NOTE  Mr. CLERANCE UMLAND Date of Birth:  1946-08-12 Medical Record Number:  [id number]   HPI: Mr. Sabol is a 78 year old male who is seen today for initial office follow-up visit following hospital consultation for stroke in January 2025.  History is obtained from the patient and review of electronic medical records and I personally reviewed pertinent available imaging films in PACS.  He has a PMH significant for HTN, RA, Prostate CA, GERD who presented on 09/07/2023 as a CODE STROKE due to R-sided weakness, slurred speech and aphasia. Patient was at a cafe and suddenly became aphasic with right sided weakness. EMS was called and patient's weakness became more generalized. Patient was hypertensive with SBP over 200 en route. On exam at bridge, patient was able to give history, oriented to month, confused to age and year, was dysarthric, slight drift BLE, decreased right lower field of vision, reduced sensation to left arm. He was taken for STAT [facility] which showed no acute abnormality with aspect score of 10.  CT angiogram showed no large vessel stenosis or occlusion.  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  He was given labetalol x1 was given due to hypertension. Due to concern for PRES, STAT MRI was ordered. This showed a small hyperacute left occipital infarct.  Emergent treatment plan with thrombolytic therapy was explained to the patient, and patient's son and wife thoroughly, as were risks, benefits and alternatives. All questions were answered. Patient, patient's wife and son expressed understanding of the treatment plan and agreed to proceed with thrombolytic treatment.  NIH stroke scale on admission was 6.  Patient was kept in the ICU and blood pressure was tightly controlled.  MRI scan showed acute infarct in the left periventricular occipital lobe.  Remote infarct was noted  in the right occipital lobe and bilateral cerebellum.  There were moderate to severe changes of chronic small vessel disease.  2D echo showed ejection fraction of 25 to 30% with global hypokinesis and mild concentric left ventricular hypertrophy.  TEE showed ASD with bidirectional shunt but no definite clot in the heart.  LDL cholesterol 105 mg percent.  Hemoglobin A1c was 6.4.  Patient patient was on started on aspirin and Plavix for 3 weeks and then aspirin alone.  Patient was switched to Eliquis for presumptive cardiogenic embolism due to his low EF.  Patient's right lower field of vision improved but he had persistent decrease left-sided peripheral field of vision from one of his previous strokes.  Patient states is done well since discharge.  His speech is better.  His vision is also improving but not back to baseline.  He is finished physical and Occupational Therapy.  He is tolerating Eliquis with minor bruising but no bleeding.  He started driving a bit.  He is mostly independent with all actives of daily living.  Patient has not started Crestor for unclear reason but is willing to start it.  ROS:   14 system review of systems is positive for blurred vision, speech difficulty, bruising all other systems negative  PMH:  Past Medical History:  Diagnosis Date   GERD (gastroesophageal reflux disease)    Hypercholesterolemia    Hypertension    Prostate cancer (HCC)    Rheumatoid arthritis (HCC)    RA    Social History:  Social History   Socioeconomic History   Marital status: Single    Spouse  name: Not on file   Number of children: 2   Years of education: 20   Highest education level: Not on file  Occupational History   Not on file  Tobacco Use   Smoking status: Former    Current packs/day: 0.00    Average packs/day: 0.3 packs/day for 30.0 years (7.5 ttl pk-yrs)    Types: Cigarettes    Start date: 05/09/1970    Quit date: 05/09/2000    Years since quitting: 23.6   Smokeless tobacco:  Never  Vaping Use   Vaping status: Never Used  Substance and Sexual Activity   Alcohol use: No   Drug use: No   Sexual activity: Yes  Other Topics Concern   Not on file  Social History Narrative   Right handed   Drinks caffeine prn   One story home   Retired    [employer] lives with wife   Social Drivers of Corporate investment banker Strain: Not on file  Food Insecurity: No Food Insecurity (09/15/2023)   Hunger Vital Sign    Worried About Running Out of Food in the Last Year: Never true    Ran Out of Food in the Last Year: Never true  Transportation Needs: No Transportation Needs (09/15/2023)   PRAPARE - Administrator, Civil Service (Medical): No    Lack of Transportation (Non-Medical): No  Physical Activity: Not on file  Stress: Not on file  Social Connections: Socially Integrated (09/15/2023)   Social Connection and Isolation Panel [NHANES]    Frequency of Communication with Friends and Family: More than three times a week    Frequency of Social Gatherings with Friends and Family: More than three times a week    Attends Religious Services: More than 4 times per year    Active Member of [employer] or Organizations: Yes    Attends Engineer, structural: More than 4 times per year    Marital Status: Married  Catering manager Violence: Not At Risk (09/15/2023)   Humiliation, Afraid, Rape, and Kick questionnaire    Fear of Current or Ex-Partner: No    Emotionally Abused: No    Physically Abused: No    Sexually Abused: No    Medications:   Current Outpatient Medications on File Prior to Visit  Medication Sig Dispense Refill   adalimumab (HUMIRA, 2 PEN,) 40 MG/0.4ML pen Inject 40 mg into the skin See admin instructions. Inject 40mg  subcutaneously every other Monday.     apixaban (ELIQUIS) 5 MG TABS tablet Take 1 tablet (5 mg total) by mouth 2 (two) times daily. 180 tablet 1   diclofenac Sodium (VOLTAREN) 1 % GEL Apply 2 g topically 2 (two) times daily as needed (pain).      folic acid (FOLVITE) 1 MG tablet Take 1 mg by mouth daily.     gabapentin (NEURONTIN) 300 MG capsule Take 300 mg by mouth at bedtime.     metoprolol succinate (TOPROL XL) 50 MG 24 hr tablet Take 1 tablet (50 mg total) by mouth 2 (two) times daily. Take with or immediately following a meal. 60 tablet 6   sacubitril-valsartan (ENTRESTO) 49-51 MG Take 1 tablet by mouth 2 (two) times daily. 60 tablet 1   tamsulosin (FLOMAX) 0.4 MG CAPS capsule Take 0.4 mg by mouth 2 (two) times daily.     No current facility-administered medications on file prior to visit.    Allergies:  No Known Allergies  Physical Exam General: well developed, well nourished, seated,  in no evident distress Head: head normocephalic and atraumatic.  Neck: supple with no carotid or supraclavicular bruits Cardiovascular: regular rate and rhythm, no murmurs Musculoskeletal: no deformity Skin:  no rash/petichiae Vascular:  Normal pulses all extremities Vitals:   12/09/23 1059  BP: 136/72  Pulse: 76   Neurologic Exam Mental Status: Awake and fully alert. Oriented to place and time. Recent and remote memory intact. Attention span, concentration and fund of knowledge appropriate. Mood and affect appropriate.  Cranial Nerves: Fundoscopic exam reveals sharp disc margins. Pupils equal, briskly reactive to light. Extraocular movements full without nystagmus. Visual fields show left upper quadrantanopsia to confrontation. Hearing intact. Facial sensation intact.  Left lower face weakness e, tongue, palate moves normally and symmetrically.  Motor: Normal bulk and tone. Normal strength in all tested extremity muscles.  Decreased fine motor movements on the left.  Orbits right over left upper extremity. Sensory.: intact to touch ,pinprick .position and vibratory sensation.  Coordination: Rapid alternating movements normal in all extremities. Finger-to-nose and heel-to-shin performed accurately bilaterally. Gait and Station: Arises  from chair without difficulty. Stance is normal. Gait demonstrates normal stride length and balance . Able to heel, toe and tandem walk with moderate difficulty.  Reflexes: 1+ and symmetric. Toes downgoing.   NIHSS  1 Modified Rankin  2   ASSESSMENT: 78 year old African-American male with embolic left PCA and MCA branch infarcts in January 2025 likely from cardiomyopathy with low ejection fraction.  Prior history of lacunar infarct in December 2024 from small vessel disease.  Vascular risk factors of cardiomyopathy, hypertension, borderline diabetes and hyperlipidemia.     PLAN:I had a long d/w patient about his recent embolic strokes and cardiomyopathy, risk for recurrent stroke/TIAs, personally independently reviewed imaging studies and stroke evaluation results and answered questions.Continue Eliquis (apixaban) 5 mg twice daily  for secondary stroke prevention for now but may switch to antiplatelet agents if his ejection fraction improved greater than 35% on follow-up echocardiogram and maintain strict control of hypertension with blood pressure goal below 130/90, diabetes with hemoglobin A1c goal below 6.5% and lipids with LDL cholesterol goal below 70 mg/dL. I also advised the patient to eat a healthy diet with plenty of whole grains, cereals, fruits and vegetables, exercise regularly and maintain ideal body weight.  I have counseled him to start taking cholesterol daily and have follow-up lipid profile checked by his primary care physician in 2 to 3 months.  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  Followup in the future with my nurse practitioner in 6 months or call earlier if necessary.  Greater than 50% of time during this 35 minute visit was spent on counseling,explanation of diagnosis stroke and vision deficit, planning of further management, discussion with patient and family and coordination of care Delia Heady, MD Note: This document  was prepared with digital dictation and possible smart phrase technology. Any transcriptional errors that result from this process are unintentional

## 2023-12-09 NOTE — Patient Instructions (Signed)
 I had a long d/w patient about his recent embolic strokes and cardiomyopathy, risk for recurrent stroke/TIAs, personally independently reviewed imaging studies and stroke evaluation results and answered questions.Continue Eliquis (apixaban) 5 mg twice daily  for secondary stroke prevention for now but may switch to antiplatelet agents if his ejection fraction improved greater than 35% on follow-up echocardiogram and maintain strict control of hypertension with blood pressure goal below 130/90, diabetes with hemoglobin A1c goal below 6.5% and lipids with LDL cholesterol goal below 70 mg/dL. I also advised the patient to eat a healthy diet with plenty of whole grains, cereals, fruits and vegetables, exercise regularly and maintain ideal body weight.  I have counseled him to start taking cholesterol daily and have follow-up lipid profile checked by his primary care physician in 2 to 3 months.  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  Followup in the future with my nurse practitioner in 6 months or call earlier if necessary.  Stroke Prevention Some medical conditions and behaviors can lead to a higher chance of having a stroke. You can help prevent a stroke by eating healthy, exercising, not smoking, and managing any medical conditions you have. Stroke is a leading cause of functional impairment. Primary prevention is particularly important because a majority of strokes are first-time events. Stroke changes the lives of not only those who experience a stroke but also their family and other caregivers. How can this condition affect me? A stroke is a medical emergency and should be treated right away. A stroke can lead to brain damage and can sometimes be life-threatening. If a person gets medical treatment right away, there is a better chance of surviving and recovering from a stroke. What can increase my risk? The following medical conditions may increase your  risk of a stroke: Cardiovascular disease. High blood pressure (hypertension). Diabetes. High cholesterol. Sickle cell disease. Blood clotting disorders (hypercoagulable state). Obesity. Sleep disorders (obstructive sleep apnea). Other risk factors include: Being older than age 66. Having a history of blood clots, stroke, or mini-stroke (transient ischemic attack, TIA). Genetic factors, such as race, ethnicity, or a family history of stroke. Smoking cigarettes or using other tobacco products. Taking birth control pills, especially if you also use tobacco. Heavy use of alcohol or drugs, especially cocaine and methamphetamine. Physical inactivity. What actions can I take to prevent this? Manage your health conditions High cholesterol levels. Eating a healthy diet is important for preventing high cholesterol. If cholesterol cannot be managed through diet alone, you may need to take medicines. Take any prescribed medicines to control your cholesterol as told by your health care provider. Hypertension. To reduce your risk of stroke, try to keep your blood pressure below 130/80. Eating a healthy diet and exercising regularly are important for controlling blood pressure. If these steps are not enough to manage your blood pressure, you may need to take medicines. Take any prescribed medicines to control hypertension as told by your health care provider. Ask your health care provider if you should monitor your blood pressure at home. Have your blood pressure checked every year, even if your blood pressure is normal. Blood pressure increases with age and some medical conditions. Diabetes. Eating a healthy diet and exercising regularly are important parts of managing your blood sugar (glucose). If your blood sugar cannot be managed through diet and exercise, you may need to take medicines. Take any prescribed medicines to control your diabetes as told by your health care  provider. Get evaluated  for obstructive sleep apnea. Talk to your health care provider about getting a sleep evaluation if you snore a lot or have excessive sleepiness. Make sure that any other medical conditions you have, such as atrial fibrillation or atherosclerosis, are managed. Nutrition Follow instructions from your health care provider about what to eat or drink to help manage your health condition. These instructions may include: Reducing your daily calorie intake. Limiting how much salt (sodium) you use to 1,500 milligrams (mg) each day. Using only healthy fats for cooking, such as olive oil, canola oil, or sunflower oil. Eating healthy foods. You can do this by: Choosing foods that are high in fiber, such as whole grains, and fresh fruits and vegetables. Eating at least 5 servings of fruits and vegetables a day. Try to fill one-half of your plate with fruits and vegetables at each meal. Choosing lean protein foods, such as lean cuts of meat, poultry without skin, fish, tofu, beans, and nuts. Eating low-fat dairy products. Avoiding foods that are high in sodium. This can help lower blood pressure. Avoiding foods that have saturated fat, trans fat, and cholesterol. This can help prevent high cholesterol. Avoiding processed and prepared foods. Counting your daily carbohydrate intake.  Lifestyle If you drink alcohol: Limit how much you have to: 0-1 drink a day for women who are not pregnant. 0-2 drinks a day for men. Know how much alcohol is in your drink. In the [place] one drink equals one 12 oz bottle of beer ( ), one 5 oz glass of wine ( ), or one 1 oz glass of hard liquor (44mL). Do not use any products that contain nicotine or tobacco. These products include cigarettes, chewing tobacco, and vaping devices, such as e-cigarettes. If you need help quitting, ask your health care provider. Avoid secondhand smoke. Do not use drugs. Activity  Try to stay at a healthy weight. Get at least 30 minutes  of exercise on most days, such as: Fast walking. Biking. Swimming. Medicines Take over-the-counter and prescription medicines only as told by your health care provider. Aspirin or blood thinners (antiplatelets or anticoagulants) may be recommended to reduce your risk of forming blood clots that can lead to stroke. Avoid taking birth control pills. Talk to your health care provider about the risks of taking birth control pills if: You are over 22 years old. You smoke. You get very bad headaches. You have had a blood clot. Where to find more information American Stroke Association: www.strokeassociation.org Get help right away if: You or a loved one has any symptoms of a stroke. "BE FAST" is an easy way to remember the main warning signs of a stroke: B - Balance. Signs are dizziness, sudden trouble walking, or loss of balance. E - Eyes. Signs are trouble seeing or a sudden change in vision. F - Face. Signs are sudden weakness or numbness of the face, or the face or eyelid drooping on one side. A - Arms. Signs are weakness or numbness in an arm. This happens suddenly and usually on one side of the body. S - Speech. Signs are sudden trouble speaking, slurred speech, or trouble understanding what people say. T - Time. Time to call emergency services. Write down what time symptoms started. You or a loved one has other signs of a stroke, such as: A sudden, severe headache with no known cause. Nausea or vomiting. Seizure. These symptoms may represent a serious problem that is an emergency. Do not wait to see if the  symptoms will go away. Get medical help right away. Call your local emergency services (911 in the [place] Do not drive yourself to the hospital. Summary You can help to prevent a stroke by eating healthy, exercising, not smoking, limiting alcohol intake, and managing any medical conditions you may have. Do not use any products that contain nicotine or tobacco. These include cigarettes,  chewing tobacco, and vaping devices, such as e-cigarettes. If you need help quitting, ask your health care provider. Remember "BE FAST" for warning signs of a stroke. Get help right away if you or a loved one has any of these signs. This information is not intended to replace advice given to you by your health care provider. Make sure you discuss any questions you have with your health care provider. Document Revised: 07/28/2022 Document Reviewed: 07/28/2022 Elsevier Patient Education  2024 [employer].

## 2023-12-10 DIAGNOSIS — K449 Diaphragmatic hernia without obstruction or gangrene: Secondary | ICD-10-CM | POA: Diagnosis not present

## 2023-12-10 DIAGNOSIS — C61 Malignant neoplasm of prostate: Secondary | ICD-10-CM | POA: Diagnosis not present

## 2023-12-10 DIAGNOSIS — K7689 Other specified diseases of liver: Secondary | ICD-10-CM | POA: Diagnosis not present

## 2023-12-16 DIAGNOSIS — L910 Hypertrophic scar: Secondary | ICD-10-CM | POA: Diagnosis not present

## 2023-12-17 ENCOUNTER — Telehealth: Payer: Self-pay

## 2023-12-17 NOTE — Patient Outreach (Signed)
 No telephone outreach to patient. Dr. Pearlean Brownie obtained mRS was successfully on 12/09/23 MRS= 2   Vanice Sarah [facility] Health Care Management Assistant  Direct Dial: [phone]  Fax: [phone] Website: [email or web address]

## 2023-12-22 DIAGNOSIS — I10 Essential (primary) hypertension: Secondary | ICD-10-CM | POA: Diagnosis not present

## 2023-12-22 DIAGNOSIS — Z8673 Personal history of transient ischemic attack (TIA), and cerebral infarction without residual deficits: Secondary | ICD-10-CM | POA: Diagnosis not present

## 2023-12-22 DIAGNOSIS — Q2111 Secundum atrial septal defect: Secondary | ICD-10-CM | POA: Diagnosis not present

## 2023-12-22 DIAGNOSIS — I509 Heart failure, unspecified: Secondary | ICD-10-CM | POA: Diagnosis not present

## 2023-12-22 DIAGNOSIS — I5030 Unspecified diastolic (congestive) heart failure: Secondary | ICD-10-CM | POA: Diagnosis not present

## 2023-12-22 DIAGNOSIS — N400 Enlarged prostate without lower urinary tract symptoms: Secondary | ICD-10-CM | POA: Diagnosis not present

## 2023-12-22 DIAGNOSIS — E7800 Pure hypercholesterolemia, unspecified: Secondary | ICD-10-CM | POA: Diagnosis not present

## 2023-12-23 DIAGNOSIS — N401 Enlarged prostate with lower urinary tract symptoms: Secondary | ICD-10-CM | POA: Diagnosis not present

## 2023-12-23 DIAGNOSIS — C61 Malignant neoplasm of prostate: Secondary | ICD-10-CM | POA: Diagnosis not present

## 2023-12-23 DIAGNOSIS — R3915 Urgency of urination: Secondary | ICD-10-CM | POA: Diagnosis not present

## 2024-01-12 ENCOUNTER — Ambulatory Visit (HOSPITAL_COMMUNITY): Payer: Medicare HMO | Attending: Cardiology

## 2024-01-12 DIAGNOSIS — I5020 Unspecified systolic (congestive) heart failure: Secondary | ICD-10-CM | POA: Diagnosis not present

## 2024-01-12 DIAGNOSIS — I420 Dilated cardiomyopathy: Secondary | ICD-10-CM | POA: Diagnosis not present

## 2024-01-12 LAB — ECHOCARDIOGRAM COMPLETE
Area-P 1/2: 2.94 cm2
P 1/2 time: 535 ms
S' Lateral: 2.8 cm

## 2024-01-12 NOTE — Progress Notes (Signed)
 LVEF is improved from 25 to 30% to the present 45%, will discuss more on office visit soon.

## 2024-01-27 ENCOUNTER — Ambulatory Visit: Payer: Medicare HMO | Attending: Cardiology | Admitting: Cardiology

## 2024-01-27 ENCOUNTER — Encounter: Payer: Self-pay | Admitting: Cardiology

## 2024-01-27 VITALS — BP 122/80 | HR 74 | Ht 67.0 in | Wt 172.0 lb

## 2024-01-27 DIAGNOSIS — I10 Essential (primary) hypertension: Secondary | ICD-10-CM

## 2024-01-27 DIAGNOSIS — I5020 Unspecified systolic (congestive) heart failure: Secondary | ICD-10-CM | POA: Diagnosis not present

## 2024-01-27 DIAGNOSIS — E7800 Pure hypercholesterolemia, unspecified: Secondary | ICD-10-CM | POA: Diagnosis not present

## 2024-01-27 DIAGNOSIS — I420 Dilated cardiomyopathy: Secondary | ICD-10-CM | POA: Diagnosis not present

## 2024-01-27 DIAGNOSIS — Q2111 Secundum atrial septal defect: Secondary | ICD-10-CM

## 2024-01-27 NOTE — Progress Notes (Signed)
 Cardiology Office Note:  .   Date:  01/27/2024  ID:  Charles Frost, DOB [birth year], MRN [id number] PCP: Freida Jes, FNP  [facility] Providers Cardiologist:  Knox Perl, MD   History of Present Illness: .   Charles Frost is a 78 y.o. history of HTN, hyperglycemia, rheumatoid arthritis on long-term Humira weekly, prostate cancer S/P TURP 2017, admitted 09/07/23 with acute right sided weakness, slurred speech, and aphasia with acute left occipital stroke thought to be embolic stroke involving small vessel, MRI brain with acute left occipital lobe and remote right occipital and bilateral cerebellar infarcts. As part of stroke workup echo obtained which showed LVEF 25-30%, new diagnosis for patient.  He also revealed a small ASD with bidirectional shunting.     He presented for the second time on 09/15/2023 with slurred speech and MRI of the brain revealed new acute cortical infarct in the left parietal lobe.  [name], [name] He engages in physical activity, including walking on a treadmill at the gym, but experiences some fatigue. He denies shortness of breath during sleep or waking up feeling unable to breathe.  His prior ejection fraction was 20-25%, and more recently it was measured at 40-45%. He is on metoprolol  succinate 50 mg once daily and Entresto  49/51 mg twice daily. His blood pressure is 122/80 mmHg.  He has a congenital atrial septal defect. He takes a cholesterol medication daily, with five pills in the morning and three at night.  Labs   Lab Results  Component Value Date   CHOL 162 09/08/2023    HDL 51 09/08/2023   LDLCALC 105 (H) 09/08/2023   TRIG 29 09/08/2023   CHOLHDL 3.2 09/08/2023   Lab Results  Component Value Date   NA 143 10/23/2023   K 4.5 10/23/2023   CO2 26 10/23/2023   GLUCOSE 96 10/23/2023   BUN 16 10/23/2023   CREATININE 1.04 10/23/2023   CALCIUM  9.6 10/23/2023   EGFR 75 10/23/2023   GFRNONAA >60 09/16/2023      Latest Ref Rng & Units 10/23/2023    3:23 PM 09/16/2023    5:41 AM 09/15/2023    8:33 AM  BMP  Glucose 70 - 99 mg/dL 96  213  086   BUN 8 - 27 mg/dL 16  25  27    Creatinine 0.76 - 1.27 mg/dL 5.78  4.69  6.29   BUN/Creat Ratio 10 - 24 15     Sodium 134 - 144 mmol/L 143  141  142   Potassium 3.5 - 5.2 mmol/L 4.5  4.4  4.5   Chloride 96 - 106 mmol/L 104  105  107   CO2 20 - 29 mmol/L 26  24    Calcium  8.6 - 10.2 mg/dL 9.6  9.2        Latest Ref Rng & Units 09/16/2023    5:41 AM 09/15/2023    8:33 AM 09/15/2023    8:28 AM  CBC  WBC 4.0 - 10.5 K/uL 6.2   5.1   Hemoglobin 13.0 - 17.0 g/dL 52.8  41.3  24.4   Hematocrit  39.0 - 52.0 % 41.2  43.0  45.6   Platelets 150 - 400 K/uL 216   217    Lab Results  Component Value Date   HGBA1C 6.4 (H) 09/07/2023    Lab Results  Component Value Date   TSH 0.89 10/08/2022    ROS  Review of Systems  Cardiovascular:  Negative for chest pain, dyspnea on exertion and leg swelling.   Physical Exam:   VS:  BP 122/80 (BP Location: Left Arm, Patient Position: Sitting, Cuff Size: Normal)   Pulse 74   Ht 5\' 7"  (1.702 m)   Wt 172 lb (78 kg)   SpO2 96%   BMI 26.94 kg/m    Wt Readings from Last 3 Encounters:  01/27/24 172 lb (78 kg)  12/09/23 175 lb (79.4 kg)  10/27/23 171 lb 3.2 oz (77.7 kg)    Physical Exam Neck:     Vascular: No carotid bruit or JVD.  Cardiovascular:     Rate and Rhythm: Normal rate and regular rhythm.     Pulses: Intact distal pulses.     Heart sounds: Normal heart sounds. No murmur heard.    No gallop.  Pulmonary:     Effort: Pulmonary effort is normal.     Breath sounds:  Normal breath sounds.  Abdominal:     General: Bowel sounds are normal.     Palpations: Abdomen is soft.  Musculoskeletal:     Right lower leg: No edema.     Left lower leg: No edema.    Studies Reviewed: Aaron Aas    ECHOCARDIOGRAM COMPLETE 01/12/2024  1. Left ventricular ejection fraction, by estimation, is 40 to 45%. Left ventricular ejection fraction by 3D volume is 43 %. The left ventricle has mildly decreased function. The left ventricle demonstrates global hypokinesis. There is moderate concentric left ventricular hypertrophy. Left ventricular diastolic parameters are consistent with Grade I diastolic dysfunction (impaired relaxation). 2. Right ventricular systolic function is normal. The right ventricular size is normal. There is normal pulmonary artery systolic pressure. The estimated right ventricular systolic pressure is 26.2 mmHg. 3. Left atrial size was mildly dilated. 4. Right atrial size was mildly dilated. 5. The mitral valve is normal in structure. Mild mitral valve regurgitation. 6. The aortic valve is tricuspid. There is mild calcification of the aortic valve. Aortic valve regurgitation is trivial. Aortic valve sclerosis/calcification is present, without any evidence of aortic stenosis. 7. There is borderline dilatation of the ascending aorta, measuring 39 mm. 8. Qp:Qs 1.1:1 (atrial fibrillation limits accuracy of this calculation). Patient in sinus during echo.  9. The inferior vena cava is normal in size with greater than 50% respiratory variability, suggesting right atrial pressure of 3 mmHg. 10. Evidence of atrial level shunting detected by color flow Doppler. There is a small secundum atrial septal defect with predominantly left to right shunting across the atrial septum. 11.  Compared to 09/07/2023, EF is improved from 25 to 30%.  EKG:         Medications and allergies    No Known Allergies   Current Outpatient Medications:    adalimumab (HUMIRA, 2 PEN,) 40 MG/0.4ML  pen, Inject 40 mg into the skin See admin instructions. Inject 40mg  subcutaneously every other Monday., Disp: , Rfl:    apixaban  (ELIQUIS ) 5 MG TABS tablet, Take 1 tablet (5 mg total) by mouth 2 (two) times daily., Disp: 180 tablet, Rfl: 1   diclofenac Sodium (VOLTAREN) 1 % GEL, Apply 2 g topically 2 (two) times daily as  needed (pain)., Disp: , Rfl:    folic acid  (FOLVITE ) 1 MG tablet, Take 1 mg by mouth daily., Disp: , Rfl:    gabapentin  (NEURONTIN ) 300 MG capsule, Take 300 mg by mouth at bedtime., Disp: , Rfl:    metoprolol  succinate (TOPROL  XL) 50 MG 24 hr tablet, Take 1 tablet (50 mg total) by mouth 2 (two) times daily. Take with or immediately following a meal., Disp: 60 tablet, Rfl: 6   sacubitril -valsartan  (ENTRESTO ) 49-51 MG, Take 1 tablet by mouth 2 (two) times daily., Disp: 60 tablet, Rfl: 1   tamsulosin  (FLOMAX ) 0.4 MG CAPS capsule, Take 0.4 mg by mouth 2 (two) times daily., Disp: , Rfl:    No orders of the defined types were placed in this encounter.   There are no discontinued medications.   ASSESSMENT AND PLAN: .      ICD-10-CM   1. HFrEF (heart failure with reduced ejection fraction) (HCC)  I50.20     2. Dilated cardiomyopathy (HCC)  I42.0     3. Primary hypertension  I10     4. 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 Hypercholesteremia  E78.00 Lipid panel    Lipid panel      Assessment and Plan Assessment & Plan Heart failure with reduced ejection fraction   Heart function has improved from 20-25% to 40-45%, showing significant recovery. No dyspnea or nocturnal dyspnea is reported, and heart size has normalized. Current medications are effective, and the prognosis is favorable with potential normalization of ejection fraction within 6 to 12 months. Improved ejection fraction significantly impacts longevity, with better outcomes expected as it exceeds 40%. Continue metoprolol  succinate 50 mg once daily and Entresto  49/51 mg twice daily. Encourage increased physical activity,  including walking in the park and uphill walking. Monitor for symptoms such as chest pain, dyspnea, or palpitations and report if he occurs.  Atrial septal defect   Congenital atrial septal defect is present but asymptomatic and not requiring intervention.  There is no RV strain, denies dyspnea and no clinical evidence of heart failure.  From paradoxical stroke standpoint, he is already on anticoagulation with Eliquis , with initial presentation of stroke in December 2024, he had ejection fraction of around 25% and second presentation on 09/15/2023 again with embolic stroke, cardiac etiology with probable mural thrombus was felt to be the etiology and presently on Eliquis .  No atrial fibrillation has been documented.  Stroke   Multiple strokes have contributed to falls and physical limitations. Stroke risk remains due to age and hyperlipidemia.  Hyperlipidemia   Cholesterol levels were previously elevated. He reports taking a statin, but the medication is not listed in the chart. A lipid profile is needed to assess current cholesterol levels. Order lipid profile and clarify statin medication to update medical records.  I will see him back in 6 months for follow-up.   Signed,  Knox Perl, MD, [facility] 01/27/2024, 3:50 PM [facility] [address] [place], [place] [address] Phone: [phone]. Fax:  [phone]

## 2024-01-27 NOTE — Patient Instructions (Signed)
 Medication Instructions:  No medication changes today.  *If you need a refill on your cardiac medications before your next appointment, please call your pharmacy*  Lab Work: To be completed today: lipid panel  If you have labs (blood work) drawn today and your tests are completely normal, you will receive your results only by: MyChart Message (if you have MyChart) OR A paper copy in the mail If you have any lab test that is abnormal or we need to change your treatment, we will call you to review the results.  Testing/Procedures: None ordered today.  Follow-Up: At [facility], you and your health needs are our priority.  As part of our continuing mission to provide you with exceptional heart care, our providers are all part of one team.  This team includes your primary Cardiologist (physician) and Advanced Practice Providers or APPs (Physician Assistants and Nurse Practitioners) who all work together to provide you with the care you need, when you need it.  Your next appointment:   6 month(s)  Provider:   Knox Perl, MD    We recommend signing up for the patient portal called "MyChart".  Sign up information is provided on this After Visit Summary.  MyChart is used to connect with patients for Virtual Visits (Telemedicine).  Patients are able to view lab/test results, encounter notes, upcoming appointments, etc.  Non-urgent messages can be sent to your provider as well.   To learn more about what you can do with MyChart, go to [email or web address].

## 2024-01-28 ENCOUNTER — Ambulatory Visit: Payer: Self-pay | Admitting: Cardiology

## 2024-01-28 LAB — LIPID PANEL
Chol/HDL Ratio: 3.5 ratio (ref 0.0–5.0)
Cholesterol, Total: 173 mg/dL (ref 100–199)
HDL: 49 mg/dL (ref >39)
LDL Chol Calc (NIH): 108 mg/dL — ABNORMAL HIGH (ref 0–99)
Triglycerides: 88 mg/dL (ref 0–149)
VLDL Cholesterol Cal: 16 mg/dL (ref 5–40)

## 2024-01-28 NOTE — Progress Notes (Signed)
Lipids 

## 2024-01-28 NOTE — Progress Notes (Signed)
 Can you please follow up on what Lipid lowering patient is on??  If not on, will start Lipitor 20 mg daily and check lipids in 2 months

## 2024-02-19 NOTE — Progress Notes (Signed)
 Will add Zetia 10 mg daily with Atorva and recheck in 3 months 90 day with 1 refill please

## 2024-03-09 ENCOUNTER — Encounter: Payer: Self-pay | Admitting: *Deleted

## 2024-03-21 DIAGNOSIS — C61 Malignant neoplasm of prostate: Secondary | ICD-10-CM | POA: Diagnosis not present

## 2024-03-21 DIAGNOSIS — L819 Disorder of pigmentation, unspecified: Secondary | ICD-10-CM | POA: Diagnosis not present

## 2024-03-21 DIAGNOSIS — E7800 Pure hypercholesterolemia, unspecified: Secondary | ICD-10-CM | POA: Diagnosis not present

## 2024-03-21 DIAGNOSIS — I509 Heart failure, unspecified: Secondary | ICD-10-CM | POA: Diagnosis not present

## 2024-03-21 DIAGNOSIS — I10 Essential (primary) hypertension: Secondary | ICD-10-CM | POA: Diagnosis not present

## 2024-03-21 DIAGNOSIS — M069 Rheumatoid arthritis, unspecified: Secondary | ICD-10-CM | POA: Diagnosis not present

## 2024-03-22 ENCOUNTER — Telehealth: Payer: Self-pay | Admitting: Cardiology

## 2024-03-22 DIAGNOSIS — E7800 Pure hypercholesterolemia, unspecified: Secondary | ICD-10-CM

## 2024-03-22 NOTE — Telephone Encounter (Signed)
 Tried to call pt, went to voicemail. Left msg for pt to call back.

## 2024-03-22 NOTE — Telephone Encounter (Signed)
 Patient calling ina bout a letter he received int he mail. Please advise

## 2024-04-07 NOTE — Telephone Encounter (Signed)
 Left message on patient's number and number listed for his wife to call office

## 2024-05-05 ENCOUNTER — Other Ambulatory Visit: Payer: Self-pay | Admitting: *Deleted

## 2024-05-05 DIAGNOSIS — E7800 Pure hypercholesterolemia, unspecified: Secondary | ICD-10-CM

## 2024-05-05 MED ORDER — EZETIMIBE 10 MG PO TABS: 10.0000 mg | ORAL_TABLET | Freq: Every day | ORAL | 1 refills | Status: AC

## 2024-05-05 NOTE — Telephone Encounter (Signed)
 I spoke with patient's wife and gave her message from previous phone call for patient to start Zetia  10 mg daily. She confirms patient is taking half of a 40 mg Rosuvastatin  tablet daily.  He will continue Rosuvastatin .  Prescription sent to [facility] in [place].  Patient will have lab work done in our office in mid to late November

## 2024-06-23 DIAGNOSIS — I10 Essential (primary) hypertension: Secondary | ICD-10-CM | POA: Diagnosis not present

## 2024-06-23 DIAGNOSIS — E7800 Pure hypercholesterolemia, unspecified: Secondary | ICD-10-CM | POA: Diagnosis not present

## 2024-06-23 DIAGNOSIS — I5020 Unspecified systolic (congestive) heart failure: Secondary | ICD-10-CM | POA: Diagnosis not present

## 2024-06-23 DIAGNOSIS — Z8673 Personal history of transient ischemic attack (TIA), and cerebral infarction without residual deficits: Secondary | ICD-10-CM | POA: Diagnosis not present

## 2024-06-23 DIAGNOSIS — Z23 Encounter for immunization: Secondary | ICD-10-CM | POA: Diagnosis not present

## 2024-06-29 NOTE — Progress Notes (Signed)
 Chief Complaint  Patient presents with   RM1/STROKE    Pt is here with his Wife. Pt states that things have been pretty good since last appointment.     HISTORY OF PRESENT ILLNESS:  07/04/24 ALL:  Charles Frost is a 78 y.o. 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 He was seen in consult with Dr Rosemarie 12/2023 and doing well. Since, he reports doing well. Speech is back to baseline. He completed therapy. He does continues to have right peripheral vision loss. Has annual eye exams. He continues Eliquis . Minimal bruising. He is taking rosuvastatin  20mg  and Zetia . LDL 108 01/2024. 167 06/2024. BP is usually 130/70s. Last A1C 6.4 08/2024. He is followed by PCP, Rankin Dike with [facility], [place].    HISTORY (copied from Dr Bucky previous note)  HPI: Charles Frost is a 78 year old male who is seen today for initial office follow-up visit following hospital consultation for stroke in January 2025.  History is obtained from the patient and review of electronic medical records and I personally reviewed pertinent available imaging films in PACS.  He has a PMH significant for HTN, RA, Prostate CA, GERD who presented on 09/07/2023 as a CODE STROKE due to R-sided weakness, slurred speech and aphasia. Patient was at a cafe and suddenly became aphasic with right sided weakness. EMS was called and patient's weakness became more generalized. Patient was hypertensive with SBP over 200 en route. On exam at bridge, patient was able to give history, oriented to month, confused to age and year, was dysarthric, slight drift BLE, decreased right lower field of vision, reduced sensation to left arm. He was taken for STAT [facility] which showed no acute abnormality with aspect score of 10.  CT angiogram showed no large vessel stenosis or occlusion.  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  He was given labetalol  x1 was given due to hypertension. Due to concern for PRES,  STAT MRI was ordered. This showed a small hyperacute left occipital infarct.  Emergent treatment plan with thrombolytic therapy was explained to the patient, and patient's son and wife thoroughly, as were risks, benefits and alternatives. All questions were answered. Patient, patient's wife and son expressed understanding of the treatment plan and agreed to proceed with thrombolytic treatment.  NIH stroke scale on admission was 6.  Patient was kept in the ICU and blood pressure was tightly controlled.  MRI scan showed acute infarct in the left periventricular occipital lobe.  Remote infarct was noted in the right occipital lobe and bilateral cerebellum.  There were moderate to severe changes of chronic small vessel disease.  2D echo showed ejection fraction of 25 to 30% with global hypokinesis and mild concentric left ventricular hypertrophy.  TEE showed ASD with bidirectional shunt but no definite clot in the heart.  LDL cholesterol 105 mg percent.  Hemoglobin A1c was 6.4.  Patient patient was on started on aspirin  and Plavix  for 3 weeks and then aspirin  alone.  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  Patient's right lower field of vision improved but he had persistent decrease left-sided peripheral field of vision from one of his previous strokes.  Patient states is done well since discharge.  His speech is better.  His vision is also improving but not back to baseline.  He is finished physical and Occupational Therapy.  He is tolerating Eliquis  with minor bruising but no bleeding.  He  started driving a bit.  He is mostly independent with all actives of daily living.  Patient has not started Crestor  for unclear reason but is willing to start it.    REVIEW OF SYSTEMS: Out of a complete 14 system review of symptoms, the patient complains only of the following symptoms, peripheral vision loss, right side and all other reviewed systems are  negative.   ALLERGIES: No Known Allergies   HOME MEDICATIONS: Outpatient Medications Prior to Visit  Medication Sig Dispense Refill   adalimumab (HUMIRA, 2 PEN,) 40 MG/0.4ML pen Inject 40 mg into the skin See admin instructions. Inject 40mg  subcutaneously every other Monday.     Adalimumab-bwwd 40 MG/0.4ML SOAJ Inject into the skin.     apixaban  (ELIQUIS ) 5 MG TABS tablet Take 1 tablet (5 mg total) by mouth 2 (two) times daily. 180 tablet 1   diclofenac Sodium (VOLTAREN) 1 % GEL Apply 2 g topically 2 (two) times daily as needed (pain).     folic acid  (FOLVITE ) 1 MG tablet Take 1 mg by mouth daily.     gabapentin  (NEURONTIN ) 300 MG capsule Take 300 mg by mouth at bedtime.     metoprolol  succinate (TOPROL  XL) 50 MG 24 hr tablet Take 1 tablet (50 mg total) by mouth 2 (two) times daily. Take with or immediately following a meal. 60 tablet 6   rosuvastatin  (CRESTOR ) 40 MG tablet Take 20 mg by mouth daily.     sacubitril -valsartan  (ENTRESTO ) 49-51 MG Take 1 tablet by mouth 2 (two) times daily. 60 tablet 1   tamsulosin  (FLOMAX ) 0.4 MG CAPS capsule Take 0.4 mg by mouth 2 (two) times daily.     ezetimibe  (ZETIA ) 10 MG tablet Take 1 tablet (10 mg total) by mouth daily. (Patient not taking: Reported on 07/04/2024) 90 tablet 1   No facility-administered medications prior to visit.     PAST MEDICAL HISTORY: Past Medical History:  Diagnosis Date   GERD (gastroesophageal reflux disease)    Hypercholesterolemia    Hypertension    Prostate cancer (HCC)    Rheumatoid arthritis (HCC)    RA     PAST SURGICAL HISTORY: Past Surgical History:  Procedure Laterality Date   cysto uretero remove stone  2009   cystoscopy insert stent  2009   PROSTATE BIOPSY     TRANSESOPHAGEAL ECHOCARDIOGRAM (CATH LAB) N/A 09/16/2023   Procedure: TRANSESOPHAGEAL ECHOCARDIOGRAM;  Surgeon: Lonni Slain, MD;  Location: [facility]VASIVE [facility];  Service: Cardiovascular;  Laterality: N/A;   ulnar nerve release Right  2008     FAMILY HISTORY: Family History  Problem Relation Age of Onset   Cancer Maternal Aunt    Cancer Paternal Grandfather    Dementia Mother      SOCIAL HISTORY: Social History   Socioeconomic History   Marital status: Single    Spouse name: Not on file   Number of children: 2   Years of education: 20   Highest education level: Not on file  Occupational History   Not on file  Tobacco Use   Smoking status: Former    Current packs/day: 0.00    Average packs/day: 0.3 packs/day for 30.0 years (7.5 ttl pk-yrs)    Types: Cigarettes    Start date: 05/09/1970    Quit date: 05/09/2000    Years since quitting: 24.1   Smokeless tobacco: Never  Vaping Use   Vaping status: Never Used  Substance and Sexual Activity   Alcohol  use: No   Drug use: No  Sexual activity: Yes  Other Topics Concern   Not on file  Social History Narrative   Right handed   Drinks caffeine prn   One story home   Retired    [employer] lives with wife   Social Drivers of Corporate Investment Banker Strain: Not on file  Food Insecurity: No Food Insecurity (09/15/2023)   Hunger Vital Sign    Worried About Running Out of Food in the Last Year: Never true    Ran Out of Food in the Last Year: Never true  Transportation Needs: No Transportation Needs (09/15/2023)   PRAPARE - Administrator, Civil Service (Medical): No    Lack of Transportation (Non-Medical): No  Physical Activity: Not on file  Stress: Not on file  Social Connections: Socially Integrated (09/15/2023)   Social Connection and Isolation Panel    Frequency of Communication with Friends and Family: More than three times a week    Frequency of Social Gatherings with Friends and Family: More than three times a week    Attends Religious Services: More than 4 times per year    Active Member of [employer] or Organizations: Yes    Attends Banker Meetings: More than 4 times per year    Marital Status: Married  Catering Manager  Violence: Not At Risk (09/15/2023)   Humiliation, Afraid, Rape, and Kick questionnaire    Fear of Current or Ex-Partner: No    Emotionally Abused: No    Physically Abused: No    Sexually Abused: No     PHYSICAL EXAM  Vitals:   07/04/24 1103  BP: (!) 132/90  Pulse: 68  SpO2: 98%  Weight: 176 lb 8 oz (80.1 kg)  Height: 5' 8 (1.727 m)   Body mass index is 26.84 kg/m.  Generalized: Well developed, in no acute distress  Cardiology: normal rate and rhythm, no murmur auscultated  Respiratory: clear to auscultation bilaterally    Neurological examination  Mentation: Alert oriented to time, place, history taking. Follows all commands speech and language fluent Cranial nerve II-XII: Pupils were equal round reactive to light. Extraocular movements were full, visual field were full on confrontational test. Facial sensation and strength were normal. Uvula tongue midline. Head turning and shoulder shrug  were normal and symmetric. Motor: The motor testing reveals 5 over 5 strength of all 4 extremities. Good symmetric motor tone is noted throughout.  Sensory: Sensory testing is intact to soft touch on all 4 extremities. No evidence of extinction is noted.  Coordination: Cerebellar testing reveals good finger-nose-finger and heel-to-shin bilaterally.  Gait and station: Pushes to standing position. Gait is normal.    DIAGNOSTIC DATA (LABS, IMAGING, TESTING) - I reviewed patient records, labs, notes, testing and imaging myself where available.  Lab Results  Component Value Date   WBC 6.2 09/16/2023   HGB 12.9 (L) 09/16/2023   HCT 41.2 09/16/2023   MCV 85.8 09/16/2023   PLT 216 09/16/2023      Component Value Date/Time   NA 143 10/23/2023 1523   K 4.5 10/23/2023 1523   CL 104 10/23/2023 1523   CO2 26 10/23/2023 1523   GLUCOSE 96 10/23/2023 1523   GLUCOSE 107 (H) 09/16/2023 0541   BUN 16 10/23/2023 1523   CREATININE 1.04 10/23/2023 1523   CALCIUM  9.6 10/23/2023 1523   PROT 6.8  09/15/2023 0828   ALBUMIN 3.7 09/15/2023 0828   AST 24 09/15/2023 0828   ALT 20 09/15/2023 0828   ALKPHOS 47 09/15/2023  9171   BILITOT 0.7 09/15/2023 0828   GFRNONAA >60 09/16/2023 0541   GFRAA >60 12/08/2016 0424   Lab Results  Component Value Date   CHOL 173 01/27/2024   HDL 49 01/27/2024   LDLCALC 108 (H) 01/27/2024   TRIG 88 01/27/2024   CHOLHDL 3.5 01/27/2024   Lab Results  Component Value Date   HGBA1C 6.4 (H) 09/07/2023   Lab Results  Component Value Date   VITAMINB12 157 (L) 10/08/2022   Lab Results  Component Value Date   TSH 0.89 10/08/2022        No data to display              10/08/2022    4:00 PM  Montreal Cognitive Assessment   Visuospatial/ Executive (0/5) 4  Naming (0/3) 3  Attention: Read list of digits (0/2) 2  Attention: Read list of letters (0/1) 1  Attention: Serial 7 subtraction starting at 100 (0/3) 3  Language: Repeat phrase (0/2) 1  Language : Fluency (0/1) 1  Abstraction (0/2) 1  Delayed Recall (0/5) 3  Orientation (0/6) 6  Total 25  Adjusted Score (based on education) 25     ASSESSMENT AND PLAN  78 y.o. year old male  has a past medical history of GERD (gastroesophageal reflux disease), Hypercholesterolemia, Hypertension, Prostate cancer (HCC), and Rheumatoid arthritis (HCC). here with    Cardioembolic stroke (HCC)  Hyperlipidemia, unspecified hyperlipidemia type  Charles Frost is doing well. We discussed LDL, A1C and BP goals. He was advised to discus rosuvastatin  dose with cardiology and PCP. Last LDL 179. We discussed healthy, well balanced diet. Healthy lifestyle habits encouraged. He will follow up with PCP as directed. He will return to see me as needed. He verbalizes understanding and agreement with this plan.   No orders of the defined types were placed in this encounter.    No orders of the defined types were placed in this encounter.  I personally spent a total of 30 minutes in the care of the patient  today including preparing to see the patient, getting/reviewing separately obtained history, performing a medically appropriate exam/evaluation, counseling and educating, documenting clinical information in the EHR, independently interpreting results, and communicating results.   Charles Forbes, MSN, FNP-C 07/04/2024, 12:54 PM  [facility] [address], [address] [place], [place] [address] [phone]

## 2024-06-29 NOTE — Patient Instructions (Signed)
 Below is our plan:  Please make sure you are talking your rosuvastatin  20mg  daily. You may need to increase dose to 40mg . Please discuss with your cardiologist and PCP. Make sure A1C is being checked.   Goals:  1) Maintain strict control of hypertension with blood pressure goal below 130/90 2) Maintain good control of diabetes with hemoglobin A1c goal below 7%  3) Maintain good control of lipids with LDL cholesterol goal below 70 mg/dL.  4) Eat a healthy diet with plenty of whole grains, cereals, fruits and vegetables, exercise regularly and maintain ideal body weight   Resources: https://www.williams.biz/  Please make sure you are staying well hydrated. I recommend 50-60 ounces daily. Well balanced diet and regular exercise encouraged. Consistent sleep schedule with 6-8 hours recommended.   Please continue follow up with care team as directed.   Follow up with me as needed.   You may receive a survey regarding today's visit. I encourage you to leave honest feed back as I do use this information to improve patient care. Thank you for seeing me today!

## 2024-07-04 ENCOUNTER — Encounter: Payer: Self-pay | Admitting: Family Medicine

## 2024-07-04 ENCOUNTER — Ambulatory Visit: Admitting: Family Medicine

## 2024-07-04 VITALS — BP 132/90 | HR 68 | Ht 68.0 in | Wt 176.5 lb

## 2024-07-04 DIAGNOSIS — M25562 Pain in left knee: Secondary | ICD-10-CM | POA: Insufficient documentation

## 2024-07-04 DIAGNOSIS — Z79899 Other long term (current) drug therapy: Secondary | ICD-10-CM | POA: Insufficient documentation

## 2024-07-04 DIAGNOSIS — H40001 Preglaucoma, unspecified, right eye: Secondary | ICD-10-CM | POA: Insufficient documentation

## 2024-07-04 DIAGNOSIS — Z23 Encounter for immunization: Secondary | ICD-10-CM | POA: Insufficient documentation

## 2024-07-04 DIAGNOSIS — F4310 Post-traumatic stress disorder, unspecified: Secondary | ICD-10-CM | POA: Insufficient documentation

## 2024-07-04 DIAGNOSIS — I639 Cerebral infarction, unspecified: Secondary | ICD-10-CM

## 2024-07-04 DIAGNOSIS — H2513 Age-related nuclear cataract, bilateral: Secondary | ICD-10-CM | POA: Insufficient documentation

## 2024-07-04 DIAGNOSIS — Z7729 Contact with and (suspected ) exposure to other hazardous substances: Secondary | ICD-10-CM | POA: Insufficient documentation

## 2024-07-04 DIAGNOSIS — M549 Dorsalgia, unspecified: Secondary | ICD-10-CM | POA: Insufficient documentation

## 2024-07-04 DIAGNOSIS — I5020 Unspecified systolic (congestive) heart failure: Secondary | ICD-10-CM | POA: Insufficient documentation

## 2024-07-04 DIAGNOSIS — K219 Gastro-esophageal reflux disease without esophagitis: Secondary | ICD-10-CM | POA: Insufficient documentation

## 2024-07-04 DIAGNOSIS — E785 Hyperlipidemia, unspecified: Secondary | ICD-10-CM | POA: Diagnosis not present

## 2024-07-04 DIAGNOSIS — Z7189 Other specified counseling: Secondary | ICD-10-CM | POA: Insufficient documentation

## 2024-07-04 DIAGNOSIS — Z719 Counseling, unspecified: Secondary | ICD-10-CM | POA: Insufficient documentation

## 2024-07-04 DIAGNOSIS — L730 Acne keloid: Secondary | ICD-10-CM | POA: Insufficient documentation

## 2024-07-04 DIAGNOSIS — M069 Rheumatoid arthritis, unspecified: Secondary | ICD-10-CM | POA: Insufficient documentation

## 2024-07-04 DIAGNOSIS — I509 Heart failure, unspecified: Secondary | ICD-10-CM | POA: Insufficient documentation

## 2024-08-01 ENCOUNTER — Ambulatory Visit: Admitting: Dermatology

## 2024-08-01 ENCOUNTER — Encounter: Payer: Self-pay | Admitting: Dermatology

## 2024-08-01 DIAGNOSIS — L820 Inflamed seborrheic keratosis: Secondary | ICD-10-CM | POA: Diagnosis not present

## 2024-08-01 DIAGNOSIS — L821 Other seborrheic keratosis: Secondary | ICD-10-CM | POA: Diagnosis not present

## 2024-08-01 NOTE — Progress Notes (Signed)
   New Patient Visit   History of Present Illness Charles Frost is a 78 year old male who presents with an irritated spot on his left arm.  He noticed the spot earlier this year. It appeared suddenly one morning and has grown since then. He has not experienced a similar spot before.  He attempted to treat the spot with a wart remover. He has never had skin cancer and has not had any spots frozen before. The spot has become bigger and more irritating over time.  No pain associated with the spot, only irritation. No history of skin cancer and no previous similar spots.  The following portions of the chart were reviewed this encounter and updated as appropriate: medications, allergies, medical history  Review of Systems:  No other skin or systemic complaints except as noted in HPI or Assessment and Plan.  Objective  Well appearing patient in no apparent distress; mood and affect are within normal limits.  A focused examination was performed of the following areas: Left arm  Relevant exam findings are noted in the Assessment and Plan.  Left Forearm - Anterior Inflamed stuck on papule  Assessment & Plan   SEBORRHEIC KERATOSIS - Stuck-on, waxy, tan-brown papules and/or plaques  - Benign-appearing - Discussed benign etiology and prognosis. - Observe - Call for any changes  INFLAMED SEBORRHEIC KERATOSIS Left Forearm - Anterior Destruction of lesion - Left Forearm - Anterior Complexity: simple   Destruction method: cryotherapy   Informed consent: discussed and consent obtained   Outcome: patient tolerated procedure well with no complications   Post-procedure details: wound care instructions given    SEBORRHEIC KERATOSIS    Return in about 2 months (around 10/01/2024) for ISK follow up.  I, Darice Smock, CMA, am acting as scribe for RUFUS CHRISTELLA HOLY, MD.   Documentation: I have reviewed the above documentation for accuracy and completeness, and I agree with the  above.  RUFUS CHRISTELLA HOLY, MD

## 2024-08-01 NOTE — Patient Instructions (Signed)

## 2024-08-11 ENCOUNTER — Telehealth (HOSPITAL_COMMUNITY): Payer: Self-pay | Admitting: *Deleted

## 2024-08-11 ENCOUNTER — Ambulatory Visit: Attending: Cardiology | Admitting: Cardiology

## 2024-08-11 ENCOUNTER — Encounter: Payer: Self-pay | Admitting: Cardiology

## 2024-08-11 VITALS — BP 146/84 | HR 75 | Resp 16 | Ht 68.0 in | Wt 176.9 lb

## 2024-08-11 DIAGNOSIS — Q2111 Secundum atrial septal defect: Secondary | ICD-10-CM

## 2024-08-11 DIAGNOSIS — Z79899 Other long term (current) drug therapy: Secondary | ICD-10-CM | POA: Diagnosis not present

## 2024-08-11 DIAGNOSIS — I5022 Chronic systolic (congestive) heart failure: Secondary | ICD-10-CM | POA: Diagnosis not present

## 2024-08-11 DIAGNOSIS — I63443 Cerebral infarction due to embolism of bilateral cerebellar arteries: Secondary | ICD-10-CM | POA: Diagnosis not present

## 2024-08-11 DIAGNOSIS — I420 Dilated cardiomyopathy: Secondary | ICD-10-CM | POA: Diagnosis not present

## 2024-08-11 MED ORDER — SACUBITRIL-VALSARTAN 97-103 MG PO TABS: 1.0000 | ORAL_TABLET | Freq: Two times a day (BID) | ORAL | 3 refills | Status: AC

## 2024-08-11 NOTE — Patient Instructions (Signed)
 Medication Instructions:  Please INCREASE your dose of Entresto  to 97-103.  *If you need a refill on your cardiac medications before your next appointment, please call your pharmacy*  Lab Work: Please complete a BMET and a FASTING lipid panel at any [facility] in 4 weeks.  If you have labs (blood work) drawn today and your tests are completely normal, you will receive your results only by: MyChart Message (if you have MyChart) OR A paper copy in the mail If you have any lab test that is abnormal or we need to change your treatment, we will call you to review the results.  Testing/Procedures: Your physician has requested that you have an echocardiogram in 6 months. Echocardiography is a painless test that uses sound waves to create images of your heart. It provides your doctor with information about the size and shape of your heart and how well your heart's chambers and valves are working. This procedure takes approximately one hour. There are no restrictions for this procedure. Please do NOT wear cologne, perfume, aftershave, or lotions (deodorant is allowed). Please arrive 15 minutes prior to your appointment time.  Please note: We ask at that you not bring children with you during ultrasound (echo/ vascular) testing. Due to room size and safety concerns, children are not allowed in the ultrasound rooms during exams. Our front office staff cannot provide observation of children in our lobby area while testing is being conducted. An adult accompanying a patient to their appointment will only be allowed in the ultrasound room at the discretion of the ultrasound technician under special circumstances. We apologize for any inconvenience.  Dear [name],  You are scheduled for a Myocardial Perfusion Imaging Study on:  ______ at _____________.  Please arrive 15 minutes prior to your appointment time for registration and insurance purposes.  The test will take approximately 3 to 4 hours to complete;  you may bring reading material.  If someone comes with you to your appointment, they will need to remain in the main lobby due to limited space in the testing area. **If you are pregnant or breastfeeding, please notify the nuclear lab prior to your appointment**  How to prepare for your Myocardial Perfusion Test: Do not eat or drink 3 hours prior to your test, except you may have water. Do not consume products containing caffeine (regular or decaffeinated) 12 hours prior to your test. (ex: coffee, chocolate, sodas, tea). Do bring a list of your current medications with you.  If not listed below, you may take your medications as normal. Do wear comfortable clothes (no dresses or overalls) and walking shoes, tennis shoes preferred (No heels or open toe shoes are allowed). Do NOT wear cologne, perfume, aftershave, or lotions (deodorant is allowed). If these instructions are not followed, your test will have to be rescheduled.  Please report to [address] ([facility]), 2nd Floor, for your test.  If you have questions or concerns about your appointment, you can call the Nuclear Lab at [phone].  If you cannot keep your appointment, please provide 24 hours notification to the Nuclear Lab, to avoid a possible $50 charge to your account. Follow-Up: At [facility], you and your health needs are our priority.  As part of our continuing mission to provide you with exceptional heart care, our providers are all part of one team.  This team includes your primary Cardiologist (physician) and Advanced Practice Providers or APPs (Physician Assistants and  Nurse Practitioners) who all work together to provide you with the care you need, when you need it.  Your next appointment:   6 month(s) (after you have completed your echocardiogram)  Provider:   Gordy Bergamo, MD    We recommend signing up for the patient portal called MyChart.  Sign up  information is provided on this After Visit Summary.  MyChart is used to connect with patients for Virtual Visits (Telemedicine).  Patients are able to view lab/test results, encounter notes, upcoming appointments, etc.  Non-urgent messages can be sent to your provider as well.   To learn more about what you can do with MyChart, go to [email or web address].

## 2024-08-11 NOTE — Progress Notes (Signed)
 Cardiology Office Note:  .   Date:  08/11/2024  ID:  [name], DOB [birth year], MRN [id number] PCP: Montey Lot, PA-C  [facility] Providers Cardiologist:  Gordy Bergamo, MD   History of Present Illness: .   Charles Frost is a 79 y.o. 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  It was felt that his stroke could have been secondary to aortic atherosclerosis or from cardiac source due to low EF although TEE did not reveal any obvious abnormalities.  Echo on 09/07/2023 showed LVEF 25-30%,  a small ASD with bidirectional shunting, new diagnosis for patient.  On medical therapy, echocardiogram on 01/12/2024 has improved to 43%.      Discussed the use of AI scribe software for clinical note transcription with the patient, who gave verbal consent to proceed.  History of Present Illness Charles Frost is a 78 year old male with a history of stroke and heart disease who presents for cardiovascular follow-up. He is accompanied by his wife.  He has had multiple strokes, most recently in December 2024, initially involving the left brain with speech difficulty and right-sided weakness. He has largely recovered with only mild residual right-sided weakness. He denies any current bleeding issues.  He has heart disease with prior echocardiogram showing EF 20-25% at the time of his stroke, improving to 43% six months ago on medical therapy. He also has a small atrial septal defect. He takes Entresto  49/51 mg twice daily and rosuvastatin  40 mg daily.  He has intermittent heaviness in the right leg and past imbalance, dizziness, and speech difficulty related to his stroke. He is a former smoker, about one pack per week, and has PTSD  that he manages without medications.  Cardiac Studies relevent.    ECHOCARDIOGRAM COMPLETE 01/12/2024  1. Left ventricular ejection fraction, by estimation, is 40 to 45%. Left ventricular ejection fraction by 3D volume is 43 %. The left ventricle has mildly decreased function. The left ventricle demonstrates global hypokinesis. There is moderate concentric left ventricular hypertrophy. Left ventricular diastolic parameters are consistent with Grade I diastolic dysfunction (impaired relaxation). 2. Right ventricular systolic function is normal. The right ventricular size is normal. There is normal pulmonary artery systolic pressure. The estimated right ventricular systolic pressure is 26.2 mmHg. 3.  Evidence of atrial level shunting detected by color flow Doppler. There is a small secundum atrial septal defect with predominantly left to right shunting across the atrial septum. 4.  Compared to 09/07/2023, EF is improved from 25 to 30%.  Labs   Lab Results  Component Value Date   CHOL 173 01/27/2024   HDL 49 01/27/2024   LDLCALC 108 (H) 01/27/2024   TRIG 88 01/27/2024   CHOLHDL 3.5 01/27/2024   No results found for: LIPOA  Recent Labs    09/07/23 1056 09/07/23 1101 09/15/23 0828 09/15/23 0833 09/16/23 0541 10/23/23 1523  NA 138   < > 140 142 141 143  K 4.4   < > 4.7 4.5 4.4 4.5  CL 105   < > 106 107 105 104  CO2 23  --  24  --  24 26  GLUCOSE 110*   < > 124* 117* 107* 96  BUN 16   < > 21 27* 25* 16  CREATININE 1.08   < > 1.34* 1.30* 1.25* 1.04  CALCIUM  9.8  --  9.5  --  9.2 9.6  GFRNONAA >60  --  55*  --  >60  --    < > = values in this interval not displayed.    Lab Results  Component Value Date   ALT 20 09/15/2023   AST 24 09/15/2023   ALKPHOS 47 09/15/2023   BILITOT 0.7 09/15/2023      Latest Ref Rng & Units 09/16/2023    5:41 AM 09/15/2023    8:33 AM 09/15/2023    8:28 AM  CBC  WBC 4.0 - 10.5 K/uL 6.2   5.1   Hemoglobin 13.0 - 17.0 g/dL 87.0  85.3  85.9   Hematocrit  39.0 - 52.0 % 41.2  43.0  45.6   Platelets 150 - 400 K/uL 216   217    Lab Results  Component Value Date   HGBA1C 6.4 (H) 09/07/2023    Lab Results  Component Value Date   TSH 0.89 10/08/2022     ROS  Review of Systems  Cardiovascular:  Negative for chest pain, dyspnea on exertion and leg swelling.   Physical Exam:   VS:  BP (!) 146/84 (BP Location: Left Arm, Patient Position: Sitting, Cuff Size: Normal)   Pulse 75   Resp 16   Ht 5' 8 (1.727 m)   Wt 176 lb 14.4 oz (80.2 kg)   SpO2 98%   BMI 26.90 kg/m    Wt Readings from Last 3 Encounters:  08/11/24 176 lb 14.4 oz (80.2 kg)  07/04/24 176 lb 8 oz (80.1 kg)  01/27/24 172 lb (78 kg)    BP Readings from Last 3 Encounters:  08/11/24 (!) 146/84  07/04/24 (!) 132/90  01/27/24 122/80   Physical Exam Neck:     Vascular: No carotid bruit or JVD.  Cardiovascular:     Rate and Rhythm: Normal rate and regular rhythm.     Pulses: Intact distal pulses.     Heart sounds: Normal heart sounds. No murmur heard.    No gallop.  Pulmonary:     Effort: Pulmonary effort is normal.     Breath sounds: Normal breath sounds.  Abdominal:     General: Bowel sounds are normal.     Palpations: Abdomen is soft.  Musculoskeletal:     Right lower leg: No edema.     Left lower leg: No edema.    EKG:         ASSESSMENT AND PLAN: .      ICD-10-CM   1. Dilated cardiomyopathy (HCC)  I42.0 sacubitril -valsartan  (ENTRESTO ) 97-103 MG    Cardiac Stress Test: Informed Consent Details: Physician/Practitioner Attestation; Transcribe to consent form and obtain patient signature    Myocardial Perfusion Imaging    ECHOCARDIOGRAM COMPLETE    2. Chronic heart failure with mildly reduced ejection fraction (HFmrEF, 41-49%) (HCC)  I50.22 sacubitril -valsartan  (ENTRESTO ) 97-103 MG    Cardiac Stress Test: Informed Consent Details: Physician/Practitioner Attestation; Transcribe to consent form and obtain patient signature    Myocardial Perfusion Imaging     ECHOCARDIOGRAM COMPLETE    Basic Metabolic Panel (BMET)    Basic Metabolic Panel (BMET)    3. 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  Medication management  Z79.899 Basic Metabolic Panel (BMET)    Lipid panel    Lipid panel    Basic Metabolic Panel (BMET)     Assessment & Plan Chronic heart failure with mildly reduced ejection fraction (EF 43%) due to dilated cardiomyopathy Chronic heart failure with mildly reduced ejection fraction (EF 43%) due to dilated cardiomyopathy. Previous EF was 20-25%, improved to 43% with medical therapy. No stress test performed previously to assess for coronary artery blockages. - Increased Entresto  to maximum dose of 98/103 mg twice daily to improve heart function. - Ordered stress test to evaluate for ischemic heart disease - Scheduled echocardiogram in six months.  Secundum atrial septal defect Secundum atrial septal defect, which may have contributed to embolic stroke. No current symptoms of bleeding or other complications. - Continue Eliquis  to prevent embolic events. - Discussed with Dr. Rosemarie (Neurology) who feels atheroemboli or thrombus from LV dysfunction along with atherosclerosis of the cerebral vessels may have contributed to his stroke and recommended medical therapy for ASD unless symptomatic from.  History of embolic stroke with residual weakness History of embolic stroke with residual left-sided weakness. Stroke likely related to embolic events from heart, possibly due to atrial septal defect or heart failure. No current symptoms of depression or PTSD affecting mental health. - Continue Eliquis  to prevent recurrent strokes. - Consulted with neurologist regarding stroke management and lifestyle modifications, he can be switched over to  antiplatelet therapy however I would like to wait to reevaluate his LV systolic function prior to switching him to Plavix  alone on his next office visit in 6 months.  Hyperlipidemia Previous high cholesterol levels. Current medication regimen includes rosuvastatin  40 mg daily. Cholesterol levels need re-evaluation due to medication changes. - Checked cholesterol levels in 3-4 weeks. - Continue rosuvastatin  40 mg daily.   Follow up: 6 months for follow-up of cardiomyopathy.  Signed,  Gordy Bergamo, MD, [facility] 08/11/2024, 5:58 PM [facility] [address] [place], [place] [address] Phone: [phone]. Fax:  [phone]

## 2024-08-11 NOTE — Telephone Encounter (Signed)
 Spoke to patient as a reminder regarding his STRESS TEST on 08/19/24 at 10:30.

## 2024-08-16 ENCOUNTER — Other Ambulatory Visit: Payer: Self-pay | Admitting: Cardiology

## 2024-08-16 DIAGNOSIS — I63443 Cerebral infarction due to embolism of bilateral cerebellar arteries: Secondary | ICD-10-CM

## 2024-08-16 DIAGNOSIS — I420 Dilated cardiomyopathy: Secondary | ICD-10-CM

## 2024-08-16 DIAGNOSIS — I5022 Chronic systolic (congestive) heart failure: Secondary | ICD-10-CM

## 2024-08-19 ENCOUNTER — Inpatient Hospital Stay (HOSPITAL_COMMUNITY): Admission: RE | Admit: 2024-08-19 | Discharge: 2024-08-19 | Attending: Cardiology | Admitting: Cardiology

## 2024-08-19 DIAGNOSIS — I420 Dilated cardiomyopathy: Secondary | ICD-10-CM

## 2024-08-19 DIAGNOSIS — I63443 Cerebral infarction due to embolism of bilateral cerebellar arteries: Secondary | ICD-10-CM

## 2024-08-19 DIAGNOSIS — I5022 Chronic systolic (congestive) heart failure: Secondary | ICD-10-CM

## 2024-08-19 LAB — MYOCARDIAL PERFUSION IMAGING
Base ST Depression (mm): 0 mm
LV dias vol: 78 mL (ref 62–150)
LV sys vol: 45 mL (ref 4.2–5.8)
Nuc Stress EF: 42 %
Peak HR: 82 {beats}/min
Rest HR: 67 {beats}/min
Rest Nuclear Isotope Dose: 10.8 mCi
SDS: 7
SRS: 0
SSS: 7
ST Depression (mm): 0 mm
Stress Nuclear Isotope Dose: 31.4 mCi
TID: 0.92

## 2024-08-19 MED ORDER — TECHNETIUM TC 99M TETROFOSMIN IV KIT
10.8000 | PACK | Freq: Once | INTRAVENOUS | Status: AC | PRN
  Administered 2024-08-19: 10.8 via INTRAVENOUS

## 2024-08-19 MED ORDER — REGADENOSON 0.4 MG/5ML IV SOLN
INTRAVENOUS | Status: AC
  Filled 2024-08-19: qty 5

## 2024-08-19 MED ORDER — AMINOPHYLLINE 25 MG/ML IV SOLN
INTRAVENOUS | Status: AC
  Filled 2024-08-19: qty 10

## 2024-08-19 MED ORDER — AMINOPHYLLINE 25 MG/ML IV SOLN
150.0000 mg | Freq: Once | INTRAVENOUS | Status: AC
  Administered 2024-08-19: 150 mg via INTRAVENOUS

## 2024-08-19 MED ORDER — REGADENOSON 0.4 MG/5ML IV SOLN
0.4000 mg | Freq: Once | INTRAVENOUS | Status: AC
  Administered 2024-08-19: 0.4 mg via INTRAVENOUS

## 2024-08-19 MED ORDER — TECHNETIUM TC 99M TETROFOSMIN IV KIT
31.4000 | PACK | Freq: Once | INTRAVENOUS | Status: AC | PRN
  Administered 2024-08-19: 31.4 via INTRAVENOUS

## 2024-08-20 ENCOUNTER — Ambulatory Visit: Payer: Self-pay | Admitting: Cardiology

## 2024-08-20 NOTE — Progress Notes (Signed)
 Stress test is abnormal and will discuss on his visit soon

## 2024-08-23 NOTE — Progress Notes (Signed)
 2nd attempt : Called patient, NA, left message regarding his Myocardial Perfusion Imaging results,

## 2024-08-25 NOTE — Progress Notes (Signed)
 3rd attempt to contact pateint: NA, left message regarding his Myocardial Perfusion Imaging results, letter printed and mailed to patient.

## 2024-09-07 ENCOUNTER — Other Ambulatory Visit: Payer: Self-pay | Admitting: Cardiology

## 2024-09-09 MED ORDER — METOPROLOL SUCCINATE ER 50 MG PO TB24: 50.0000 mg | ORAL_TABLET | Freq: Two times a day (BID) | ORAL | 3 refills | Status: AC

## 2024-09-28 ENCOUNTER — Ambulatory Visit: Admitting: Dermatology

## 2024-09-28 ENCOUNTER — Encounter: Payer: Self-pay | Admitting: Dermatology

## 2024-09-28 DIAGNOSIS — L905 Scar conditions and fibrosis of skin: Secondary | ICD-10-CM | POA: Diagnosis not present

## 2024-09-28 DIAGNOSIS — Z872 Personal history of diseases of the skin and subcutaneous tissue: Secondary | ICD-10-CM

## 2024-09-28 DIAGNOSIS — L820 Inflamed seborrheic keratosis: Secondary | ICD-10-CM

## 2024-09-28 NOTE — Progress Notes (Signed)
" ° °  Follow-Up Visit History of Present Illness Charles Frost is a 79 year old male who presents for follow-up regarding a skin spot, ISK, treated with cryotherapy on his left forearm.  The skin spot is currently abnormal in color, but he is not concerned. His son accidentally removed some skin while helping him with a shirt, but the spot appears to be gone, and he is not worried about any other spots today.  He is experiencing a recurrence of sciatica pain, which was previously treated successfully. He is scheduled for an MRI in February.  Patient present today for follow up after treating an inflamed seborrheic keratosis on the left forearm. Patient was last evaluated on 08/01/2024. Patient states it is doing well and has no concerns.   The following portions of the chart were reviewed this encounter and updated as appropriate: medications, allergies, medical history  Review of Systems:  No other skin or systemic complaints except as noted in HPI or Assessment and Plan.  Objective  Well appearing patient in no apparent distress; mood and affect are within normal limits.  A focused examination was performed of the following areas: Left forearm  Relevant exam findings are noted in the Assessment and Plan.    Assessment & Plan   History of Seborrheic Keratosis Exam: Left Forearm- Anterior  Treatment Plan: No additional treatment needed.  Scar - Healed well s/p cryotherapy on his left forearm   Return if symptoms worsen or fail to improve.  LILLETTE Rollene Gobble, RN, am acting as scribe for RUFUS CHRISTELLA HOLY, MD .   Documentation: I have reviewed the above documentation for accuracy and completeness, and I agree with the above.  RUFUS CHRISTELLA HOLY, MD   "

## 2024-09-28 NOTE — Patient Instructions (Addendum)

## 2024-10-03 ENCOUNTER — Ambulatory Visit: Admitting: Dermatology

## 2025-02-07 ENCOUNTER — Ambulatory Visit (HOSPITAL_COMMUNITY)
